# Patient Record
Sex: Male | Born: 1965 | Race: Black or African American | Hispanic: No | Marital: Married | Smoking: Never smoker
Health system: Southern US, Community
[De-identification: ages and names within clinical notes are randomized; demographics above are authoritative.]

## PROBLEM LIST (undated history)

## (undated) DIAGNOSIS — E785 Hyperlipidemia, unspecified: Secondary | ICD-10-CM

## (undated) DIAGNOSIS — N200 Calculus of kidney: Secondary | ICD-10-CM

## (undated) DIAGNOSIS — Z972 Presence of dental prosthetic device (complete) (partial): Secondary | ICD-10-CM

## (undated) DIAGNOSIS — N289 Disorder of kidney and ureter, unspecified: Secondary | ICD-10-CM

## (undated) DIAGNOSIS — Q613 Polycystic kidney, unspecified: Secondary | ICD-10-CM

## (undated) DIAGNOSIS — M1990 Unspecified osteoarthritis, unspecified site: Secondary | ICD-10-CM

## (undated) DIAGNOSIS — I10 Essential (primary) hypertension: Secondary | ICD-10-CM

## (undated) DIAGNOSIS — Q446 Cystic disease of liver: Secondary | ICD-10-CM

## (undated) DIAGNOSIS — N183 Chronic kidney disease, stage 3 unspecified: Secondary | ICD-10-CM

## (undated) DIAGNOSIS — Z973 Presence of spectacles and contact lenses: Secondary | ICD-10-CM

## (undated) HISTORY — PX: SHOULDER SURGERY: SHX246

## (undated) HISTORY — PX: ANKLE SURGERY: SHX546

## (undated) HISTORY — PX: KNEE SURGERY: SHX244

## (undated) HISTORY — PX: WRIST SURGERY: SHX841

---
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
## 2014-02-01 ENCOUNTER — Emergency Department: Payer: Self-pay | Admitting: Emergency Medicine

## 2014-02-26 ENCOUNTER — Ambulatory Visit: Payer: Self-pay | Admitting: Podiatry

## 2015-02-13 ENCOUNTER — Encounter: Payer: Self-pay | Admitting: Emergency Medicine

## 2015-02-13 ENCOUNTER — Emergency Department
Admission: EM | Admit: 2015-02-13 | Discharge: 2015-02-13 | Disposition: A | Discharge status: Home / Self Care | Payer: Managed Care, Other (non HMO) | Attending: Emergency Medicine | Admitting: Emergency Medicine

## 2015-02-13 DIAGNOSIS — M7542 Impingement syndrome of left shoulder: Secondary | ICD-10-CM | POA: Diagnosis not present

## 2015-02-13 DIAGNOSIS — Z880 Allergy status to penicillin: Secondary | ICD-10-CM | POA: Insufficient documentation

## 2015-02-13 DIAGNOSIS — I10 Essential (primary) hypertension: Secondary | ICD-10-CM | POA: Insufficient documentation

## 2015-02-13 DIAGNOSIS — G8929 Other chronic pain: Secondary | ICD-10-CM | POA: Diagnosis not present

## 2015-02-13 DIAGNOSIS — M25531 Pain in right wrist: Secondary | ICD-10-CM | POA: Diagnosis not present

## 2015-02-13 DIAGNOSIS — M7712 Lateral epicondylitis, left elbow: Secondary | ICD-10-CM | POA: Insufficient documentation

## 2015-02-13 DIAGNOSIS — M79602 Pain in left arm: Secondary | ICD-10-CM | POA: Diagnosis present

## 2015-02-13 HISTORY — DX: Essential (primary) hypertension: I10

## 2015-02-13 HISTORY — DX: Disorder of kidney and ureter, unspecified: N28.9

## 2015-02-13 MED ORDER — ETODOLAC 400 MG PO TABS: 400.0000 mg | ORAL_TABLET | Freq: Two times a day (BID) | ORAL | Status: DC

## 2015-02-13 MED ORDER — PREDNISONE 10 MG PO TABS: 10.0000 mg | ORAL_TABLET | Freq: Every day | ORAL | Status: DC

## 2015-02-13 MED ORDER — PREDNISONE 20 MG PO TABS
60.0000 mg | ORAL_TABLET | Freq: Once | ORAL | Status: AC
  Administered 2015-02-13: 60 mg via ORAL
  Filled 2015-02-13: qty 3

## 2015-02-13 NOTE — Discharge Instructions (Signed)
Please purchase left tennis elbow strap. Discontinue meloxicam. Start 6 day prednisone taper. After completion of prednisone start etodolac. Follow-up with Dr. Martha Clan in 5-7 days.   Rotator Cuff Injury Rotator cuff injury is any type of injury to the set of muscles and tendons that make up the stabilizing unit of your shoulder. This unit holds the ball of your upper arm bone (humerus) in the socket of your shoulder blade (scapula).  CAUSES Injuries to your rotator cuff most commonly come from sports or activities that cause your arm to be moved repeatedly over your head. Examples of this include throwing, weight lifting, swimming, or racquet sports. Long lasting (chronic) irritation of your rotator cuff can cause soreness and swelling (inflammation), bursitis, and eventual damage to your tendons, such as a tear (rupture). SIGNS AND SYMPTOMS Acute rotator cuff tear:  Sudden tearing sensation followed by severe pain shooting from your upper shoulder down your arm toward your elbow.  Decreased range of motion of your shoulder because of pain and muscle spasm.  Severe pain.  Inability to raise your arm out to the side because of pain and loss of muscle power (large tears). Chronic rotator cuff tear:  Pain that usually is worse at night and may interfere with sleep.  Gradual weakness and decreased shoulder motion as the pain worsens.  Decreased range of motion. Rotator cuff tendinitis:  Deep ache in your shoulder and the outside upper arm over your shoulder.  Pain that comes on gradually and becomes worse when lifting your arm to the side or turning it inward. DIAGNOSIS Rotator cuff injury is diagnosed through a medical history, physical exam, and imaging exam. The medical history helps determine the type of rotator cuff injury. Your health care provider will look at your injured shoulder, feel the injured area, and ask you to move your shoulder in different positions. X-ray exams  typically are done to rule out other causes of shoulder pain, such as fractures. MRI is the exam of choice for the most severe shoulder injuries because the images show muscles and tendons.  TREATMENT  Chronic tear:  Medicine for pain, such as acetaminophen or ibuprofen.  Physical therapy and range-of-motion exercises may be helpful in maintaining shoulder function and strength.  Steroid injections into your shoulder joint.  Surgical repair of the rotator cuff if the injury does not heal with noninvasive treatment. Acute tear:  Anti-inflammatory medicines such as ibuprofen and naproxen to help reduce pain and swelling.  A sling to help support your arm and rest your rotator cuff muscles. Long-term use of a sling is not advised. It may cause significant stiffening of the shoulder joint.  Surgery may be considered within a few weeks, especially in younger, active people, to return the shoulder to full function.  Indications for surgical treatment include the following:  Age younger than 60 years.  Rotator cuff tears that are complete.  Physical therapy, rest, and anti-inflammatory medicines have been used for 6-8 weeks, with no improvement.  Employment or sporting activity that requires constant shoulder use. Tendinitis:  Anti-inflammatory medicines such as ibuprofen and naproxen to help reduce pain and swelling.  A sling to help support your arm and rest your rotator cuff muscles. Long-term use of a sling is not advised. It may cause significant stiffening of the shoulder joint.  Severe tendinitis may require:  Steroid injections into your shoulder joint.  Physical therapy.  Surgery. HOME CARE INSTRUCTIONS   Apply ice to your injury:  Put ice in a  plastic bag.  Place a towel between your skin and the bag.  Leave the ice on for 20 minutes, 2-3 times a day.  If you have a shoulder immobilizer (sling and straps), wear it until told otherwise by your health care  provider.  You may want to sleep on several pillows or in a recliner at night to lessen swelling and pain.  Only take over-the-counter or prescription medicines for pain, discomfort, or fever as directed by your health care provider.  Do simple hand squeezing exercises with a soft rubber ball to decrease hand swelling. SEEK MEDICAL CARE IF:   Your shoulder pain increases, or new pain or numbness develops in your arm, hand, or fingers.  Your hand or fingers are colder than your other hand. SEEK IMMEDIATE MEDICAL CARE IF:   Your arm, hand, or fingers are numb or tingling.  Your arm, hand, or fingers are increasingly swollen and painful, or they turn white or blue. MAKE SURE YOU:  Understand these instructions.  Will watch your condition.  Will get help right away if you are not doing well or get worse.   This information is not intended to replace advice given to you by your health care provider. Make sure you discuss any questions you have with your health care provider.   Document Released: 03/10/2000 Document Revised: 03/18/2013 Document Reviewed: 10/23/2012 [employer] Interactive Patient Education 2016 [employer]  Lateral Epicondylitis With Rehab Lateral epicondylitis involves inflammation and pain around the outer portion of the elbow. The pain is caused by inflammation of the tendons in the forearm that bring back (extend) the wrist. Lateral epicondylitis is also called tennis elbow, because it is very common in tennis players. However, it may occur in any individual who extends the wrist repetitively. If lateral epicondylitis is left untreated, it may become a chronic problem. SYMPTOMS   Pain, tenderness, and inflammation on the outer (lateral) side of the elbow.  Pain or weakness with gripping activities.  Pain that increases with wrist-twisting motions (playing tennis, using a screwdriver, opening a door or a jar).  Pain with lifting objects, including a coffee  cup. CAUSES  Lateral epicondylitis is caused by inflammation of the tendons that extend the wrist. Causes of injury may include:  Repetitive stress and strain on the muscles and tendons that extend the wrist.  Sudden change in activity level or intensity.  Incorrect grip in racquet sports.  Incorrect grip size of racquet (often too large).  Incorrect hitting position or technique (usually backhand, leading with the elbow).  Using a racket that is too heavy. RISK INCREASES WITH:  Sports or occupations that require repetitive and/or strenuous forearm and wrist movements (tennis, squash, racquetball, carpentry).  Poor wrist and forearm strength and flexibility.  Failure to warm up properly before activity.  Resuming activity before healing, rehabilitation, and conditioning are complete. PREVENTION   Warm up and stretch properly before activity.  Maintain physical fitness:  Strength, flexibility, and endurance.  Cardiovascular fitness.  Wear and use properly fitted equipment.  Learn and use proper technique and have a coach correct improper technique.  Wear a tennis elbow (counterforce) brace. PROGNOSIS  The course of this condition depends on the degree of the injury. If treated properly, acute cases (symptoms lasting less than 4 weeks) are often resolved in 2 to 6 weeks. Chronic (longer lasting cases) often resolve in 3 to 6 months but may require physical therapy. RELATED COMPLICATIONS   Frequently recurring symptoms, resulting in a chronic problem. Properly  treating the problem the first time decreases frequency of recurrence.  Chronic inflammation, scarring tendon degeneration, and partial tendon tear, requiring surgery.  Delayed healing or resolution of symptoms. TREATMENT  Treatment first involves the use of ice and medicine to reduce pain and inflammation. Strengthening and stretching exercises may help reduce discomfort if performed regularly. These exercises may  be performed at home if the condition is an acute injury. Chronic cases may require a referral to a physical therapist for evaluation and treatment. Your caregiver may advise a corticosteroid injection to help reduce inflammation. Rarely, surgery is needed. MEDICATION  If pain medicine is needed, nonsteroidal anti-inflammatory medicines (aspirin and ibuprofen), or other minor pain relievers (acetaminophen), are often advised.  Do not take pain medicine for 7 days before surgery.  Prescription pain relievers may be given, if your caregiver thinks they are needed. Use only as directed and only as much as you need.  Corticosteroid injections may be recommended. These injections should be reserved only for the most severe cases, because they can only be given a certain number of times. HEAT AND COLD  Cold treatment (icing) should be applied for 10 to 15 minutes every 2 to 3 hours for inflammation and pain, and immediately after activity that aggravates your symptoms. Use ice packs or an ice massage.  Heat treatment may be used before performing stretching and strengthening activities prescribed by your caregiver, physical therapist, or athletic trainer. Use a heat pack or a warm water soak. SEEK MEDICAL CARE IF: Symptoms get worse or do not improve in 2 weeks, despite treatment. EXERCISES  RANGE OF MOTION (ROM) AND STRETCHING EXERCISES - Epicondylitis, Lateral (Tennis Elbow) These exercises may help you when beginning to rehabilitate your injury. Your symptoms may go away with or without further involvement from your physician, physical therapist, or athletic trainer. While completing these exercises, remember:   Restoring tissue flexibility helps normal motion to return to the joints. This allows healthier, less painful movement and activity.  An effective stretch should be held for at least 30 seconds.  A stretch should never be painful. You should only feel a gentle lengthening or release in  the stretched tissue. RANGE OF MOTION - Wrist Flexion, Active-Assisted  Extend your right / left elbow with your fingers pointing down.*  Gently pull the back of your hand towards you, until you feel a gentle stretch on the top of your forearm.  Hold this position for __________ seconds. Repeat __________ times. Complete this exercise __________ times per day.  *If directed by your physician, physical therapist or athletic trainer, complete this stretch with your elbow bent, rather than extended. RANGE OF MOTION - Wrist Extension, Active-Assisted  Extend your right / left elbow and turn your palm upwards.*  Gently pull your palm and fingertips back, so your wrist extends and your fingers point more toward the ground.  You should feel a gentle stretch on the inside of your forearm.  Hold this position for __________ seconds. Repeat __________ times. Complete this exercise __________ times per day. *If directed by your physician, physical therapist or athletic trainer, complete this stretch with your elbow bent, rather than extended. STRETCH - Wrist Flexion  Place the back of your right / left hand on a tabletop, leaving your elbow slightly bent. Your fingers should point away from your body.  Gently press the back of your hand down onto the table by straightening your elbow. You should feel a stretch on the top of your forearm.  Hold  this position for __________ seconds. Repeat __________ times. Complete this stretch __________ times per day.  STRETCH - Wrist Extension   Place your right / left fingertips on a tabletop, leaving your elbow slightly bent. Your fingers should point backwards.  Gently press your fingers and palm down onto the table by straightening your elbow. You should feel a stretch on the inside of your forearm.  Hold this position for __________ seconds. Repeat __________ times. Complete this stretch __________ times per day.  STRENGTHENING EXERCISES -  Epicondylitis, Lateral (Tennis Elbow) These exercises may help you when beginning to rehabilitate your injury. They may resolve your symptoms with or without further involvement from your physician, physical therapist, or athletic trainer. While completing these exercises, remember:   Muscles can gain both the endurance and the strength needed for everyday activities through controlled exercises.  Complete these exercises as instructed by your physician, physical therapist or athletic trainer. Increase the resistance and repetitions only as guided.  You may experience muscle soreness or fatigue, but the pain or discomfort you are trying to eliminate should never worsen during these exercises. If this pain does get worse, stop and make sure you are following the directions exactly. If the pain is still present after adjustments, discontinue the exercise until you can discuss the trouble with your caregiver. STRENGTH - Wrist Flexors  Sit with your right / left forearm palm-up and fully supported on a table or countertop. Your elbow should be resting below the height of your shoulder. Allow your wrist to extend over the edge of the surface.  Loosely holding a __________ weight, or a piece of rubber exercise band or tubing, slowly curl your hand up toward your forearm.  Hold this position for __________ seconds. Slowly lower the wrist back to the starting position in a controlled manner. Repeat __________ times. Complete this exercise __________ times per day.  STRENGTH - Wrist Extensors  Sit with your right / left forearm palm-down and fully supported on a table or countertop. Your elbow should be resting below the height of your shoulder. Allow your wrist to extend over the edge of the surface.  Loosely holding a __________ weight, or a piece of rubber exercise band or tubing, slowly curl your hand up toward your forearm.  Hold this position for __________ seconds. Slowly lower the wrist back to  the starting position in a controlled manner. Repeat __________ times. Complete this exercise __________ times per day.  STRENGTH - Ulnar Deviators  Stand with a ____________________ weight in your right / left hand, or sit while holding a rubber exercise band or tubing, with your healthy arm supported on a table or countertop.  Move your wrist, so that your pinkie travels toward your forearm and your thumb moves away from your forearm.  Hold this position for __________ seconds and then slowly lower the wrist back to the starting position. Repeat __________ times. Complete this exercise __________ times per day STRENGTH - Radial Deviators  Stand with a ____________________ weight in your right / left hand, or sit while holding a rubber exercise band or tubing, with your injured arm supported on a table or countertop.  Raise your hand upward in front of you or pull up on the rubber tubing.  Hold this position for __________ seconds and then slowly lower the wrist back to the starting position. Repeat __________ times. Complete this exercise __________ times per day. STRENGTH - Forearm Supinators   Sit with your right / left forearm supported  on a table, keeping your elbow below shoulder height. Rest your hand over the edge, palm down.  Gently grip a hammer or a soup ladle.  Without moving your elbow, slowly turn your palm and hand upward to a "thumbs-up" position.  Hold this position for __________ seconds. Slowly return to the starting position. Repeat __________ times. Complete this exercise __________ times per day.  STRENGTH - Forearm Pronators   Sit with your right / left forearm supported on a table, keeping your elbow below shoulder height. Rest your hand over the edge, palm up.  Gently grip a hammer or a soup ladle.  Without moving your elbow, slowly turn your palm and hand upward to a "thumbs-up" position.  Hold this position for __________ seconds. Slowly return to the  starting position. Repeat __________ times. Complete this exercise __________ times per day.  STRENGTH - Grip  Grasp a tennis ball, a dense sponge, or a large, rolled sock in your hand.  Squeeze as hard as you can, without increasing any pain.  Hold this position for __________ seconds. Release your grip slowly. Repeat __________ times. Complete this exercise __________ times per day.  STRENGTH - Elbow Extensors, Isometric  Stand or sit upright, on a firm surface. Place your right / left arm so that your palm faces your stomach, and it is at the height of your waist.  Place your opposite hand on the underside of your forearm. Gently push up as your right / left arm resists. Push as hard as you can with both arms, without causing any pain or movement at your right / left elbow. Hold this stationary position for __________ seconds. Gradually release the tension in both arms. Allow your muscles to relax completely before repeating.   This information is not intended to replace advice given to you by your health care provider. Make sure you discuss any questions you have with your health care provider.   Document Released: 03/13/2005 Document Revised: 04/03/2014 Document Reviewed: 06/25/2008 Else[employer] Interactive Patient Education 2016 [employer]  Tendinitis Tendinitis is swelling and inflammation of the tendons. Tendons are band-like tissues that connect muscle to bone. Tendinitis commonly occurs in the:   Shoulders (rotator cuff).  Heels (Achilles tendon).  Elbows (triceps tendon). CAUSES Tendinitis is usually caused by overusing the tendon, muscles, and joints involved. When the tissue surrounding a tendon (synovium) becomes inflamed, it is called tenosynovitis. Tendinitis commonly develops in people whose jobs require repetitive motions. SYMPTOMS  Pain.  Tenderness.  Mild swelling. DIAGNOSIS Tendinitis is usually diagnosed by physical exam. Your health care provider may  also order X-rays or other imaging tests. TREATMENT Your health care provider may recommend certain medicines or exercises for your treatment. HOME CARE INSTRUCTIONS   Use a sling or splint for as long as directed by your health care provider until the pain decreases.  Put ice on the injured area.  Put ice in a plastic bag.  Place a towel between your skin and the bag.  Leave the ice on for 15-20 minutes, 3-4 times a day, or as directed by your health care provider.  Avoid using the limb while the tendon is painful. Perform gentle range of motion exercises only as directed by your health care provider. Stop exercises if pain or discomfort increase, unless directed otherwise by your health care provider.  Only take over-the-counter or prescription medicines for pain, discomfort, or fever as directed by your health care provider. SEEK MEDICAL CARE IF:   Your pain and swelling  increase.  You develop new, unexplained symptoms, especially increased numbness in the hands. MAKE SURE YOU:   Understand these instructions.  Will watch your condition.  Will get help right away if you are not doing well or get worse.   This information is not intended to replace advice given to you by your health care provider. Make sure you discuss any questions you have with your health care provider.   Document Released: 03/10/2000 Document Revised: 04/03/2014 Document Reviewed: 05/30/2010 [employer] Interactive Patient Education [employer]

## 2015-02-13 NOTE — ED Provider Notes (Signed)
CSN: [id number]     Arrival date & time 02/13/15  2016 History   First MD Initiated Contact with Patient 02/13/15 2106     Chief Complaint  Patient presents with  . Arm Pain    hx of arthritis     (Consider location/radiation/quality/duration/timing/severity/associated sxs/prior Treatment) HPI  49 year old male presents to emergency department for evaluation of multiple joint complaints. Pain mostly complains of left shoulder pain that is described as sharp and aching and increased with abduction and flexion greater than 90. He has a history of rotator cuff repair 10 years ago, no recent trauma or injury. Patient was recently prescribed meloxicam by PCP, this medication causes drowsiness. Patient also complains of left lateral elbow pain. He points to the lateral epi condyle. Patient has increased pain with grasping and gripping as well as with lifting with the palm down. Pain is sharp and moderate. No relief with the meloxicam. Complains of right wrist pain. He had a wrist fracture that required ORIF years ago. He has intermittent flareups of right wrist pain along the ulnar styloid. He denies any new trauma or injury. No numbness or tingling throughout the upper extremities. Patient denies any chest pain or shortness of breath. Patient works at [employer], performs repetitive overhead movements and lifts heavy objects daily.  Past Medical History  Diagnosis Date  . Hypertension   . Renal disorder    Past Surgical History  Procedure Laterality Date  . Shoulder surgery Left   . Wrist surgery Left    No family history on file. Social History  Substance Use Topics  . Smoking status: Never Smoker   . Smokeless tobacco: None  . Alcohol Use: No    Review of Systems  Constitutional: Negative.  Negative for fever, chills, activity change and appetite change.  HENT: Negative for congestion, ear pain, mouth sores, rhinorrhea, sinus pressure, sore throat and trouble swallowing.   Eyes:  Negative for photophobia, pain and discharge.  Respiratory: Negative for cough, chest tightness and shortness of breath.   Cardiovascular: Negative for chest pain and leg swelling.  Gastrointestinal: Negative for nausea, vomiting, abdominal pain, diarrhea and abdominal distention.  Genitourinary: Negative for dysuria and difficulty urinating.  Musculoskeletal: Positive for arthralgias. Negative for back pain, gait problem, neck pain (no radicular symptoms) and neck stiffness.  Skin: Negative for color change and rash.  Neurological: Negative for dizziness and headaches.  Hematological: Negative for adenopathy.  Psychiatric/Behavioral: Negative for behavioral problems and agitation.      Allergies  Penicillins  Home Medications   Prior to Admission medications   Medication Sig Start Date End Date Taking? Authorizing Provider  etodolac (LODINE) 400 MG tablet Take 1 tablet (400 mg total) by mouth 2 (two) times daily. 02/13/15   Evon Slack, PA-C  predniSONE (DELTASONE) 10 MG tablet Take 1 tablet (10 mg total) by mouth daily. 6,5,4,3,2,1 six day taper 02/13/15   Evon Slack, PA-C   There were no vitals taken for this visit. Physical Exam  Constitutional: He is oriented to person, place, and time. He appears well-developed and well-nourished.  HENT:  Head: Normocephalic and atraumatic.  Eyes: Conjunctivae and EOM are normal. Pupils are equal, round, and reactive to light.  Neck: Normal range of motion. Neck supple.  Cardiovascular: Normal rate, regular rhythm, normal heart sounds and intact distal pulses.   Pulmonary/Chest: Effort normal and breath sounds normal. No respiratory distress. He has no wheezes. He has no rales. He exhibits no tenderness.  Abdominal: Soft.  Bowel sounds are normal. He exhibits no distension. There is no tenderness.  Musculoskeletal:  Examination of left shoulder shows patient has full active and passive range of motion. Patient has increased pain with  abduction and flexion greater than 90. Patient has a positive Hawkins, impingement, into can test. Negative drop arm test.  Examination of the left elbow shows patient has tenderness of the lateral condyle with painful resisted wrist extension. No swelling warmth erythema or effusion. No pain with resisted wrist flexion. Full range of motion of the left elbow.  Examination of the right wrist shows the patient has no swelling warmth erythema. He is tender along the ulnar styloid. There is increased laxity with DRUJ stress testing. No painful popping or clicking with pronation or supination. Patient is full composite fist with grip strength 5 out of 5.  Neurological: He is alert and oriented to person, place, and time.  Skin: Skin is warm and dry.  Psychiatric: He has a normal mood and affect. His behavior is normal. Judgment and thought content normal.    ED Course  Procedures (including critical care time) Labs Review Labs Reviewed - No data to display  Imaging Review No results found. I have personally reviewed and evaluated these images and lab results as part of my medical decision-making.   EKG Interpretation None      MDM   Final diagnoses:  Left lateral epicondylitis  Rotator cuff impingement syndrome, left  Chronic pain of right wrist    49 year old male with multiple joint complaints and a history of posttraumatic osteoarthritis and tendinitis. He was recently prescribed meloxicam by PCP with no relief. Patient will be started with a 6 day prednisone taper. After completion prednisone he will start etodolac 500 mg 1 tab by mouth twice a day. He will follow-up with Dr. Martha ClanKrasinski 5-7 days for repeat evaluation. Patient was also shown tennis elbow straps, he will place this on the left forearm, avoid lifting with the palm down. Return to the ER for any worsening symptoms or urgent changes in health.    Evon Slackhomas C Gaines, PA-C 02/13/15 2121  Jennye MoccasinBrian S Quigley, MD 02/13/15  [phone]

## 2015-02-13 NOTE — ED Notes (Signed)
NAD noted at this time. Pt ambulatory to the lobby at this time. Pt denies comments/concerns at this time.

## 2015-02-13 NOTE — ED Provider Notes (Signed)
ED ECG REPORT I, Jennye MoccasinBrian S Quigley, the attending physician, personally viewed and interpreted this ECG.  Date: 02/13/2015 EKG Time: 2029 Rate: 55 Rhythm: normal sinus rhythm QRS Axis: normal Intervals: normal ST/T Wave abnormalities: normal Conduction Disutrbances: none Narrative Interpretation: unremarkable Early repolarization. No acute ischemic changes are noted  Jennye MoccasinBrian S Quigley, MD 02/13/15 2112

## 2015-02-13 NOTE — ED Notes (Signed)
Left arm pain for over a month, also c/o right wrist.  No recent injuries.  Has seen PCP in [place] about the pain and has utilized a brace but the pain has gotten worse.

## 2015-06-05 ENCOUNTER — Emergency Department
Admission: EM | Admit: 2015-06-05 | Discharge: 2015-06-05 | Disposition: A | Discharge status: Home / Self Care | Payer: Managed Care, Other (non HMO) | Attending: Emergency Medicine | Admitting: Emergency Medicine

## 2015-06-05 ENCOUNTER — Encounter: Payer: Self-pay | Admitting: Emergency Medicine

## 2015-06-05 ENCOUNTER — Emergency Department: Payer: Managed Care, Other (non HMO)

## 2015-06-05 DIAGNOSIS — I10 Essential (primary) hypertension: Secondary | ICD-10-CM | POA: Insufficient documentation

## 2015-06-05 DIAGNOSIS — Z880 Allergy status to penicillin: Secondary | ICD-10-CM | POA: Insufficient documentation

## 2015-06-05 DIAGNOSIS — Z79899 Other long term (current) drug therapy: Secondary | ICD-10-CM | POA: Diagnosis not present

## 2015-06-05 DIAGNOSIS — M79672 Pain in left foot: Secondary | ICD-10-CM | POA: Insufficient documentation

## 2015-06-05 DIAGNOSIS — G8929 Other chronic pain: Secondary | ICD-10-CM | POA: Diagnosis not present

## 2015-06-05 DIAGNOSIS — M79671 Pain in right foot: Secondary | ICD-10-CM | POA: Insufficient documentation

## 2015-06-05 MED ORDER — PREDNISONE 10 MG PO TABS: ORAL_TABLET | ORAL | Status: DC

## 2015-06-05 NOTE — ED Provider Notes (Signed)
[facility] Emergency Department Provider Note  ____________________________________________  Time seen: Approximately 4:54 PM  I have reviewed the triage vital signs and the nursing notes.   HISTORY  Chief Complaint Foot Injury   HPI John Gregory is a 50 y.o. male is here with complaint of bilateral foot pain for 3 weeks. Patient states that he was told at work that this most likely is gout and he is here for treatment. He states that he has seen a podiatrist in the past for his feet but because of his work and has not been able to make an appointment. He has not taken any over-the-counter medication for his foot pain. He denies any injury to his feet but states that he works at [employer] and is constantly walking.He rates his pain as 6/10.   Past Medical History  Diagnosis Date  . Hypertension   . Renal disorder     There are no active problems to display for this patient.   Past Surgical History  Procedure Laterality Date  . Shoulder surgery Left   . Wrist surgery Left     Current Outpatient Rx  Name  Route  Sig  Dispense  Refill  . etodolac (LODINE) 400 MG tablet   Oral   Take 1 tablet (400 mg total) by mouth 2 (two) times daily.   30 tablet   0   . predniSONE (DELTASONE) 10 MG tablet      Take 3 tablets for 3 days   9 tablet   0     Allergies Penicillins  No family history on file.  Social History Social History  Substance Use Topics  . Smoking status: Never Smoker   . Smokeless tobacco: None  . Alcohol Use: No    Review of Systems Constitutional: No fever/chills Cardiovascular: Denies chest pain. Respiratory: Denies shortness of breath. Gastrointestinal:  No nausea, no vomiting.   Musculoskeletal: Bilateral foot pain, chronic Skin: Negative for rash. Neurological: Negative for headaches, focal weakness or numbness.  10-point ROS otherwise negative.  ____________________________________________   PHYSICAL  EXAM:  VITAL SIGNS: ED Triage Vitals  Enc Vitals Group     BP 06/05/15 1627 156/109 mmHg     Pulse Rate 06/05/15 1627 64     Resp 06/05/15 1627 18     Temp 06/05/15 1627 97.7 F (36.5 C)     Temp Source 06/05/15 1627 Oral     SpO2 06/05/15 1627 100 %     Weight 06/05/15 1627 145 lb (65.772 kg)     Height 06/05/15 1627  (1.778 m)     Head Cir --      Peak Flow --      Pain Score 06/05/15 1628 6     Pain Loc --      Pain Edu? --      Excl. in GC? --     Constitutional: Alert and oriented. Well appearing and in no acute distress. Eyes: Conjunctivae are normal. PERRL. EOMI. Head: Atraumatic. Nose: No congestion/rhinnorhea. Neck: No stridor.   Cardiovascular: Normal rate, regular rhythm. Grossly normal heart sounds.  Good peripheral circulation. Respiratory: Normal respiratory effort.  No retractions. Lungs CTAB. Gastrointestinal: Soft and nontender. No distention.  Musculoskeletal: Examination of feet films show any gross abnormality. There is some minimal tenderness on palpation of the plantar aspect of both feet. There is no soft tissue edema and no erythema present. Pulses present bilaterally Neurologic:  Normal speech and language. No gross focal neurologic deficits  are appreciated. No gait instability. Skin:  Skin is warm, dry and intact. No rash noted. No erythema, ecchymosis or abrasions were noted. Psychiatric: Mood and affect are normal. Speech and behavior are normal.  ____________________________________________   LABS (all labs ordered are listed, but only abnormal results are displayed)  Labs Reviewed - No data to display  RADIOLOGY  Bilateral foot x-rays show hallux valgus deformity and degenerative changes but otherwise normal ____________________________________________   PROCEDURES  Procedure(s) performed: None  Critical Care performed: No  ____________________________________________   INITIAL IMPRESSION / ASSESSMENT AND PLAN / ED  COURSE  Pertinent labs & imaging results that were available during my care of the patient were reviewed by me and considered in my medical decision making (see chart for details).  Patient was placed on prednisone 30 mg for 3 days. He is to follow-up with Dr. Alberteen Spindleline he is seen in the past for his feet. ____________________________________________   FINAL CLINICAL IMPRESSION(S) / ED DIAGNOSES  Final diagnoses:  Bilateral foot pain      Tommi RumpsRhonda L Herrle, PA-C 06/05/15 2138  Sharyn CreamerMark Quale, MD 06/06/15 0000

## 2015-06-05 NOTE — Discharge Instructions (Signed)
You need to follow-up with your primary care doctor or Dr. Alberteen Spindleline for further evaluation of your foot pain and feet swelling. Prednisone 30 mg for 3 days for foot pain due to the fact they have polycystic kidney disease anti-inflammatories are not suggested.

## 2015-06-05 NOTE — ED Notes (Signed)
Bilateral foot pain x 3 weeks, denies injury.

## 2015-07-21 ENCOUNTER — Emergency Department
Admission: EM | Admit: 2015-07-21 | Discharge: 2015-07-21 | Disposition: A | Discharge status: Home / Self Care | Payer: Managed Care, Other (non HMO) | Attending: Emergency Medicine | Admitting: Emergency Medicine

## 2015-07-21 ENCOUNTER — Emergency Department: Payer: Managed Care, Other (non HMO)

## 2015-07-21 DIAGNOSIS — Z87442 Personal history of urinary calculi: Secondary | ICD-10-CM | POA: Diagnosis not present

## 2015-07-21 DIAGNOSIS — Q613 Polycystic kidney, unspecified: Secondary | ICD-10-CM | POA: Diagnosis not present

## 2015-07-21 DIAGNOSIS — I10 Essential (primary) hypertension: Secondary | ICD-10-CM | POA: Insufficient documentation

## 2015-07-21 DIAGNOSIS — Z79899 Other long term (current) drug therapy: Secondary | ICD-10-CM | POA: Insufficient documentation

## 2015-07-21 DIAGNOSIS — R109 Unspecified abdominal pain: Secondary | ICD-10-CM | POA: Diagnosis present

## 2015-07-21 DIAGNOSIS — N289 Disorder of kidney and ureter, unspecified: Secondary | ICD-10-CM | POA: Insufficient documentation

## 2015-07-21 HISTORY — DX: Calculus of kidney: N20.0

## 2015-07-21 LAB — BASIC METABOLIC PANEL
Anion gap: 8 (ref 5–15)
BUN: 22 mg/dL — AB (ref 6–20)
CHLORIDE: 103 mmol/L (ref 101–111)
CO2: 27 mmol/L (ref 22–32)
Calcium: 8.7 mg/dL — ABNORMAL LOW (ref 8.9–10.3)
Creatinine, Ser: 2.58 mg/dL — ABNORMAL HIGH (ref 0.61–1.24)
GFR calc Af Amer: 32 mL/min — ABNORMAL LOW (ref >60)
GFR calc non Af Amer: 28 mL/min — ABNORMAL LOW (ref >60)
GLUCOSE: 114 mg/dL — AB (ref 65–99)
POTASSIUM: 4.1 mmol/L (ref 3.5–5.1)
Sodium: 138 mmol/L (ref 135–145)

## 2015-07-21 LAB — URINALYSIS COMPLETE WITH MICROSCOPIC (ARMC ONLY)
Bacteria, UA: NONE SEEN
Bilirubin Urine: NEGATIVE
Glucose, UA: NEGATIVE mg/dL
HGB URINE DIPSTICK: NEGATIVE
KETONES UR: NEGATIVE mg/dL
LEUKOCYTES UA: NEGATIVE
Nitrite: NEGATIVE
PH: 5 (ref 5.0–8.0)
PROTEIN: NEGATIVE mg/dL
SPECIFIC GRAVITY, URINE: 1.009 (ref 1.005–1.030)
Squamous Epithelial / LPF: NONE SEEN

## 2015-07-21 LAB — CBC
HEMATOCRIT: 37 % — AB (ref 40.0–52.0)
HEMOGLOBIN: 12.5 g/dL — AB (ref 13.0–18.0)
MCH: 27.3 pg (ref 26.0–34.0)
MCHC: 33.6 g/dL (ref 32.0–36.0)
MCV: 81.2 fL (ref 80.0–100.0)
Platelets: 195 10*3/uL (ref 150–440)
RBC: 4.56 MIL/uL (ref 4.40–5.90)
RDW: 13.2 % (ref 11.5–14.5)
WBC: 4.7 10*3/uL (ref 3.8–10.6)

## 2015-07-21 MED ORDER — TRAMADOL HCL 50 MG PO TABS: 50.0000 mg | ORAL_TABLET | Freq: Four times a day (QID) | ORAL | Status: AC | PRN

## 2015-07-21 NOTE — Discharge Instructions (Signed)
Flank Pain Flank pain refers to pain that is located on the side of the body between the upper abdomen and the back. The pain may occur over a short period of time (acute) or may be long-term or reoccurring (chronic). It may be mild or severe. Flank pain can be caused by many things. CAUSES  Some of the more common causes of flank pain include:  Muscle strains.   Muscle spasms.   A disease of your spine (vertebral disk disease).   A lung infection (pneumonia).   Fluid around your lungs (pulmonary edema).   A kidney infection.   Kidney stones.   A very painful skin rash caused by the chickenpox virus (shingles).   Gallbladder disease.  HOME CARE INSTRUCTIONS  Home care will depend on the cause of your pain. In general,  Rest as directed by your caregiver.  Drink enough fluids to keep your urine clear or pale yellow.  Only take over-the-counter or prescription medicines as directed by your caregiver. Some medicines may help relieve the pain.  Tell your caregiver about any changes in your pain.  Follow up with your caregiver as directed. SEEK IMMEDIATE MEDICAL CARE IF:   Your pain is not controlled with medicine.   You have new or worsening symptoms.  Your pain increases.   You have abdominal pain.   You have shortness of breath.   You have persistent nausea or vomiting.   You have swelling in your abdomen.   You feel faint or pass out.   You have blood in your urine.  You have a fever or persistent symptoms for more than 2-3 days.  You have a fever and your symptoms suddenly get worse. MAKE SURE YOU:   Understand these instructions.  Will watch your condition.  Will get help right away if you are not doing well or get worse.   This information is not intended to replace advice given to you by your health care provider. Make sure you discuss any questions you have with your health care provider.   Document Released: 05/04/2005 Document  Revised: 12/06/2011 Document Reviewed: 10/26/2011 Elsevier Interactive Patient Education 2016 Elsevier Inc.  Polycystic Kidney Disease, Adult Polycystic kidney disease is a disease in which the kidneys grow many small fluid-filled cysts. These cysts squeeze healthy kidney tissue, hurting the function of the kidneys. Polycystic kidney disease is present at birth and often causes progressive loss of kidney function and high blood pressure (hypertension). In milder forms of the disease, kidney function may be adequate throughout life.  CAUSES  The condition is usually inherited from parents. SIGNS AND SYMPTOMS  Hypertension.   Not enough healthy red blood cells to carry adequate oxygen to your tissues (anemia).   Pain in middle and side of the back below ribs (flank pain). This can happen if a cyst is bleeding.   Blood in the urine.   Kidney failure.   Kidney stones.   Increased urination at night.   Liver disease and liver cysts.  DIAGNOSIS  Your health care provider will ask you about your history and symptoms and perform a physical exam. Tests may be done to diagnose the condition. They may include an ultrasound, a CT scan, or an MRI scan. Sometimes chromosome studies are done to see if you have the genes to have polycystic kidneys.  TREATMENT   There is no treatment at this time to keep cysts from forming or getting larger.  Cysts may need to be drained with a needle  if they are large, putting pressure on other organs, and further destroying kidney tissue. This may require surgery.  Hypertension may be treated with medicines. Treating hypertension slows down further damage to the kidneys.  If kidney failure occurs, dialysis or transplantation is used to treat the disease. HOME CARE INSTRUCTIONS You should follow up with a kidney specialist (nephrologist) so that your kidney function is monitored. SEEK MEDICAL CARE IF:  You have severe pain in the flank area.  You  have blood in your urine.   This information is not intended to replace advice given to you by your health care provider. Make sure you discuss any questions you have with your health care provider.   Document Released: 12/06/2000 Document Revised: 07/28/2014 Document Reviewed: 08/15/2012 Elsevier Interactive Patient Education 2016 Elsevier Inc. Chronic Kidney Disease Chronic kidney disease occurs when the kidneys are damaged over a long period. The kidneys are two organs that lie on either side of the spine between the middle of the back and the front of the abdomen. The kidneys:  Remove wastes and extra water from the blood.  Produce important hormones. These help keep bones strong, regulate blood pressure, and help create red blood cells.  Balance the fluids and chemicals in the blood and tissues. A small amount of kidney damage may not cause problems, but a large amount of damage may make it difficult or impossible for the kidneys to work the way they should. If steps are not taken to slow down the kidney damage or stop it from getting worse, the kidneys may stop working permanently. Most of the time, chronic kidney disease does not go away. However, it can often be controlled, and those with the disease can usually live normal lives. CAUSES The most common causes of chronic kidney disease are diabetes and high blood pressure (hypertension). Chronic kidney disease may also be caused by:  Diseases that cause the kidneys' filters to become inflamed.  Diseases that affect the immune system.  Genetic diseases.  Medicines that damage the kidneys, such as anti-inflammatory medicines.  Poisoning or exposure to toxic substances.  A reoccurring kidney or urinary infection.  A problem with urine flow. This may be caused by:  Cancer.  Kidney stones.  An enlarged prostate in males. SIGNS AND SYMPTOMS Because the kidney damage in chronic kidney disease occurs slowly, symptoms develop  slowly and may not be obvious until the kidney damage becomes severe. A person may have a kidney disease for years without showing any symptoms. Symptoms can include:  Swelling (edema) of the legs, ankles, or feet.  Tiredness (lethargy).  Nausea or vomiting.  Confusion.  Problems with urination, such as:  Decreased urine production.  Frequent urination, especially at night.  Frequent accidents in children who are potty trained.  Muscle twitches and cramps.  Shortness of breath.  Weakness.  Persistent itchiness.  Loss of appetite.  Metallic taste in the mouth.  Trouble sleeping.  Slowed development in children.  Short stature in children. DIAGNOSIS Chronic kidney disease may be detected and diagnosed by tests, including blood, urine, imaging, or kidney biopsy tests. TREATMENT Most chronic kidney diseases cannot be cured. Treatment usually involves relieving symptoms and preventing or slowing the progression of the disease. Treatment may include:  A special diet. You may need to avoid alcohol and foods thatare salty and high in potassium.  Medicines. These may:  Lower blood pressure.  Relieve anemia.  Relieve swelling.  Protect the bones. HOME CARE INSTRUCTIONS  Follow your prescribed  diet. Your health care provider may instruct you to limit daily salt (sodium) and protein intake.  Take medicines only as directed by your health care provider. Do not take any new medicines (prescription, over-the-counter, or nutritional supplements) unless approved by your health care provider. Many medicines can worsen your kidney damage or need to have the dose adjusted.   Quit smoking if you smoke. Talk to your health care provider about a smoking cessation program.  Keep all follow-up visits as directed by your health care provider.  Monitor your blood pressure.  Start or continue an exercise plan.  Get immunizations as directed by your health care  provider.  Take vitamin and mineral supplements as directed by your health care provider. SEEK IMMEDIATE MEDICAL CARE IF:  Your symptoms get worse or you develop new symptoms.  You develop symptoms of end-stage kidney disease. These include:  Headaches.  Abnormally dark or light skin.  Numbness in the hands or feet.  Easy bruising.  Frequent hiccups.  Menstruation stops.  You have a fever.  You have decreased urine production.  You havepain or bleeding when urinating. MAKE SURE YOU:  Understand these instructions.  Will watch your condition.  Will get help right away if you are not doing well or get worse. FOR MORE INFORMATION   American Association of Kidney Patients: ResidentialShow.iswww.aakp.org  National Kidney Foundation: www.kidney.org  American Kidney Fund: FightingMatch.com.eewww.akfinc.org  [employer] Program: www.lifeoptions.org and www.kidneyschool.org   This information is not intended to replace advice given to you by your health care provider. Make sure you discuss any questions you have with your health care provider.   Document Released: 12/21/2007 Document Revised: 04/03/2014 Document Reviewed: 11/10/2011 Elsevier Interactive Patient Education [employer] Elsevier Inc.

## 2015-07-21 NOTE — ED Notes (Signed)
Pt arrives to ER via POV, ambulatory c/o right flank pain that began yesterday, swelling in ankles. Hx of kidney stones and "problems with my kidneys".

## 2015-07-21 NOTE — ED Provider Notes (Signed)
[facility] Emergency Department Provider Note        Time seen: ----------------------------------------- 9:36 PM on 07/21/2015 -----------------------------------------    I have reviewed the triage vital signs and the nursing notes.   HISTORY  Chief Complaint Flank Pain    HPI John Gregory is a 50 y.o. male who presents to ER for right flank pain that began yesterday. He's also noted some swelling to his ankles. He states he has a history of kidney stones and problems with his kidneys. He was told recently he had depressed kidney functioning he's been referred to a nephrologist for follow-up. Patient states he still making urine as normal.   Past Medical History  Diagnosis Date  . Hypertension   . Renal disorder   . Kidney stone     There are no active problems to display for this patient.   Past Surgical History  Procedure Laterality Date  . Shoulder surgery Left   . Wrist surgery Left     Allergies Calcium-containing compounds and Penicillins  Social History Social History  Substance Use Topics  . Smoking status: Never Smoker   . Smokeless tobacco: None  . Alcohol Use: No    Review of Systems Constitutional: Negative for fever. Eyes: Negative for visual changes. ENT: Negative for sore throat. Cardiovascular: Negative for chest pain. Respiratory: Negative for shortness of breath. Gastrointestinal: Positive for flank pain Genitourinary: Negative for dysuria. Musculoskeletal: Negative for back pain. Positive for leg swelling Skin: Negative for rash. Neurological: Negative for headaches, focal weakness or numbness.  10-point ROS otherwise negative.  ____________________________________________   PHYSICAL EXAM:  VITAL SIGNS: ED Triage Vitals  Enc Vitals Group     BP 07/21/15 2050 134/97 mmHg     Pulse Rate 07/21/15 2050 67     Resp 07/21/15 2050 18     Temp 07/21/15 2050 98.1 F (36.7 C)     Temp Source  07/21/15 2050 Oral     SpO2 07/21/15 2050 98 %     Weight 07/21/15 2050 142 lb (64.411 kg)     Height 07/21/15 2050  (1.778 m)     Head Cir --      Peak Flow --      Pain Score 07/21/15 2051 5     Pain Loc --      Pain Edu? --      Excl. in GC? --     Constitutional: Alert and oriented. Well appearing and in no distress. Eyes: Conjunctivae are normal. PERRL. Normal extraocular movements. ENT   Head: Normocephalic and atraumatic.   Nose: No congestion/rhinnorhea.   Mouth/Throat: Mucous membranes are moist.   Neck: No stridor. Cardiovascular: Normal rate, regular rhythm. No murmurs, rubs, or gallops. Respiratory: Normal respiratory effort without tachypnea nor retractions. Breath sounds are clear and equal bilaterally. No wheezes/rales/rhonchi. Gastrointestinal: Soft and nontender. Normal bowel sounds Musculoskeletal: Nontender with normal range of motion in all extremities. No lower extremity tenderness nor edema. Neurologic:  Normal speech and language. No gross focal neurologic deficits are appreciated.  Skin:  Skin is warm, dry and intact. No rash noted. Psychiatric: Mood and affect are normal. Speech and behavior are normal.   ____________________________________________  ED COURSE:  Pertinent labs & imaging results that were available during my care of the patient were reviewed by me and considered in my medical decision making (see chart for details). Patient has a benign exam, we'll check basic labs and possible CT imaging. ____________________________________________    LABS (  pertinent positives/negatives)  Labs Reviewed  BASIC METABOLIC PANEL - Abnormal; Notable for the following:    Glucose, Bld 114 (*)    BUN 22 (*)    Creatinine, Ser 2.58 (*)    Calcium 8.7 (*)    GFR calc non Af Amer 28 (*)    GFR calc Af Amer 32 (*)    All other components within normal limits  CBC - Abnormal; Notable for the following:    Hemoglobin 12.5 (*)    HCT 37.0  (*)    All other components within normal limits  URINALYSIS COMPLETEWITH MICROSCOPIC ([facility] ONLY) - Abnormal; Notable for the following:    Color, Urine STRAW (*)    APPearance CLEAR (*)    All other components within normal limits    RADIOLOGY Images were viewed by me  CT renal protocol IMPRESSION: 1. 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 No evidence of intracystic acute hemorrhage or infection. No hydronephrosis. 2. Left nephrolithiasis 3. Bilateral inguinal hernias, containing nonobstructed small bowel on the left. ____________________________________________  FINAL ASSESSMENT AND PLAN  Flank pain, renal insufficiency, polycystic kidney disease  Plan: Patient with labs and imaging as dictated above. Patient presents for flank pain and was concerned that his polycystic kidneys have gotten worse. Patient is aware of his renal insufficiency and has nephrology follow-up scheduled. I will encourage mild pain medication and returning for worsening symptoms.   Emily FilbertWilliams, Shayde Gervacio E, MD   Note: This dictation was prepared with Dragon dictation. Any transcriptional errors that result from this process are unintentional   Emily FilbertJonathan E Dierks Wach, MD 07/21/15 2230

## 2015-11-03 ENCOUNTER — Encounter (INDEPENDENT_AMBULATORY_CARE_PROVIDER_SITE_OTHER): Payer: Managed Care, Other (non HMO) | Admitting: Podiatry

## 2015-11-03 NOTE — Progress Notes (Signed)
This encounter was created in error - please disregard.

## 2015-12-14 ENCOUNTER — Emergency Department: Payer: Managed Care, Other (non HMO)

## 2015-12-14 ENCOUNTER — Emergency Department
Admission: EM | Admit: 2015-12-14 | Discharge: 2015-12-15 | Disposition: A | Discharge status: Home / Self Care | Payer: Managed Care, Other (non HMO) | Attending: Emergency Medicine | Admitting: Emergency Medicine

## 2015-12-14 ENCOUNTER — Encounter: Payer: Self-pay | Admitting: Emergency Medicine

## 2015-12-14 DIAGNOSIS — R05 Cough: Secondary | ICD-10-CM | POA: Diagnosis not present

## 2015-12-14 DIAGNOSIS — R0602 Shortness of breath: Secondary | ICD-10-CM | POA: Diagnosis not present

## 2015-12-14 DIAGNOSIS — Z79899 Other long term (current) drug therapy: Secondary | ICD-10-CM | POA: Insufficient documentation

## 2015-12-14 DIAGNOSIS — I10 Essential (primary) hypertension: Secondary | ICD-10-CM | POA: Diagnosis not present

## 2015-12-14 DIAGNOSIS — R079 Chest pain, unspecified: Secondary | ICD-10-CM | POA: Diagnosis not present

## 2015-12-14 DIAGNOSIS — R0781 Pleurodynia: Secondary | ICD-10-CM | POA: Diagnosis present

## 2015-12-14 LAB — CBC WITH DIFFERENTIAL/PLATELET
BASOS PCT: 1 %
Basophils Absolute: 0 10*3/uL (ref 0–0.1)
Eosinophils Absolute: 0.2 10*3/uL (ref 0–0.7)
Eosinophils Relative: 4 %
HEMATOCRIT: 36.6 % — AB (ref 40.0–52.0)
HEMOGLOBIN: 12.6 g/dL — AB (ref 13.0–18.0)
Lymphocytes Relative: 43 %
Lymphs Abs: 2.1 10*3/uL (ref 1.0–3.6)
MCH: 28 pg (ref 26.0–34.0)
MCHC: 34.5 g/dL (ref 32.0–36.0)
MCV: 81.2 fL (ref 80.0–100.0)
Monocytes Absolute: 0.4 10*3/uL (ref 0.2–1.0)
Monocytes Relative: 8 %
NEUTROS ABS: 2.2 10*3/uL (ref 1.4–6.5)
NEUTROS PCT: 44 %
Platelets: 174 10*3/uL (ref 150–440)
RBC: 4.51 MIL/uL (ref 4.40–5.90)
RDW: 13.2 % (ref 11.5–14.5)
WBC: 4.9 10*3/uL (ref 3.8–10.6)

## 2015-12-14 LAB — COMPREHENSIVE METABOLIC PANEL
ALK PHOS: 50 U/L (ref 38–126)
ALT: 13 U/L — ABNORMAL LOW (ref 17–63)
ANION GAP: 6 (ref 5–15)
AST: 22 U/L (ref 15–41)
Albumin: 4.1 g/dL (ref 3.5–5.0)
BILIRUBIN TOTAL: 0.7 mg/dL (ref 0.3–1.2)
BUN: 19 mg/dL (ref 6–20)
CALCIUM: 9.1 mg/dL (ref 8.9–10.3)
CO2: 28 mmol/L (ref 22–32)
Chloride: 105 mmol/L (ref 101–111)
Creatinine, Ser: 1.88 mg/dL — ABNORMAL HIGH (ref 0.61–1.24)
GFR calc non Af Amer: 41 mL/min — ABNORMAL LOW (ref >60)
GFR, EST AFRICAN AMERICAN: 47 mL/min — AB (ref >60)
Glucose, Bld: 90 mg/dL (ref 65–99)
Potassium: 3.1 mmol/L — ABNORMAL LOW (ref 3.5–5.1)
Sodium: 139 mmol/L (ref 135–145)
TOTAL PROTEIN: 7 g/dL (ref 6.5–8.1)

## 2015-12-14 LAB — FIBRIN DERIVATIVES D-DIMER (ARMC ONLY): Fibrin derivatives D-dimer (ARMC): 306 (ref 0–499)

## 2015-12-14 LAB — TROPONIN I: Troponin I: <0.03 ng/mL (ref <0.03)

## 2015-12-14 MED ORDER — ASPIRIN 81 MG PO CHEW
324.0000 mg | CHEWABLE_TABLET | Freq: Once | ORAL | Status: AC
  Administered 2015-12-14: 324 mg via ORAL
  Filled 2015-12-14: qty 4

## 2015-12-14 NOTE — ED Triage Notes (Addendum)
Pt to triage via w/c with no distress noted; pt reports left sided CP radiating into shoulder accomp by nonprod cough; st pain began hr PTA while working on a car; also reports swelling to ankles "for awhile"; st hx polycystic kidney disease

## 2015-12-14 NOTE — ED Notes (Signed)
Lab notified to add on ddimer

## 2015-12-14 NOTE — ED Provider Notes (Signed)
[facility] Emergency Department Provider Note  ____________________________________________  Time seen: Approximately 10:00 PM  I have reviewed the triage vital signs and the nursing notes.   HISTORY  Chief Complaint Chest Pain   HPI John Gregory is a 50 y.o. male history of hypertension, polycystic kidney disease who presents for evaluation of chest pain. Patient reports that he was working on his car when he developed sudden onset of left-sided pleuritic sharp chest pain radiating into his left shoulder. He reports that he felt short of breath, lightheaded, diaphoretic. The pain initially was 10 out of 10 and has now subsided. He reports he ran out he has no pain unless he coughs or takes a deep breath. He reports that he was not coughing into the pain started. The cough is dry. He denies any personal or family history of blood clots, recent travel or immobilization, leg pain or swelling, exogenous hormones, hemoptysis. He endorse ischemic heart disease in his grandfather. He is not a smoker, denies drugs, according to him had a stress test many years ago in [place] where he is from which was negative.   Past Medical History:  Diagnosis Date  . Hypertension   . Kidney stone   . Renal disorder     There are no active problems to display for this patient.   Past Surgical History:  Procedure Laterality Date  . SHOULDER SURGERY Left   . WRIST SURGERY Left     Prior to Admission medications   Medication Sig Start Date End Date Taking? Authorizing Provider  amLODipine (NORVASC) 10 MG tablet  10/20/15   Historical Provider, MD  benazepril-hydrochlorthiazide (LOTENSIN HCT) 20-12.5 MG tablet Take by mouth.    Historical Provider, MD  etodolac (LODINE) 400 MG tablet Take 1 tablet (400 mg total) by mouth 2 (two) times daily. 02/13/15   Evon Slackhomas C Gaines, PA-C  naproxen (NAPROSYN) 500 MG tablet  08/06/15   Historical Provider, MD  predniSONE (DELTASONE) 10  MG tablet Take 3 tablets for 3 days 06/05/15   Tommi Rumpshonda L Solberg, PA-C  traMADol (ULTRAM) 50 MG tablet Take 1 tablet (50 mg total) by mouth every 6 (six) hours as needed. 07/21/15 07/20/16  Emily FilbertJonathan E Williams, MD    Allergies Calcium-containing compounds and Penicillins  No family history on file.  Social History Social History  Substance Use Topics  . Smoking status: Never Smoker  . Smokeless tobacco: Never Used  . Alcohol use No    Review of Systems  Constitutional: Negative for fever. Eyes: Negative for visual changes. ENT: Negative for sore throat. Cardiovascular: + L sided chest pain. Respiratory: + shortness of breath and cough Gastrointestinal: Negative for abdominal pain, vomiting or diarrhea. Genitourinary: Negative for dysuria. Musculoskeletal: Negative for back pain. Skin: Negative for rash. Neurological: Negative for headaches, weakness or numbness.  ____________________________________________   PHYSICAL EXAM:  VITAL SIGNS: ED Triage Vitals [12/14/15 2122]  Enc Vitals Group     BP (!) 157/100     Pulse Rate (!) 59     Resp 20     Temp 97.9 F (36.6 C)     Temp Source Oral     SpO2 100 %     Weight 145 lb (65.8 kg)     Height 5\' 10"  (1.778 m)     Head Circumference      Peak Flow      Pain Score 8     Pain Loc      Pain Edu?  Excl. in GC?     Constitutional: Alert and oriented. Well appearing and in no apparent distress. HEENT:      Head: Normocephalic and atraumatic.         Eyes: Conjunctivae are normal. Sclera is non-icteric. EOMI. PERRL      Mouth/Throat: Mucous membranes are moist.       Neck: Supple with no signs of meningismus. Cardiovascular: Regular rate and rhythm. No murmurs, gallops, or rubs. 2+ symmetrical distal pulses are present in all extremities. No JVD. Respiratory: Normal respiratory effort. Lungs are clear to auscultation bilaterally. No wheezes, crackles, or rhonchi.  Gastrointestinal: Soft, non tender, and non  distended with positive bowel sounds. No rebound or guarding. Genitourinary: No CVA tenderness. Musculoskeletal: Nontender with normal range of motion in all extremities. No edema, cyanosis, or erythema of extremities. Neurologic: Normal speech and language. Face is symmetric. Moving all extremities. No gross focal neurologic deficits are appreciated. Skin: Skin is warm, dry and intact. No rash noted. Psychiatric: Mood and affect are normal. Speech and behavior are normal.  ____________________________________________   LABS (all labs ordered are listed, but only abnormal results are displayed)  Labs Reviewed  COMPREHENSIVE METABOLIC PANEL - Abnormal; Notable for the following:       Result Value   Potassium 3.1 (*)    Creatinine, Ser 1.88 (*)    ALT 13 (*)    GFR calc non Af Amer 41 (*)    GFR calc Af Amer 47 (*)    All other components within normal limits  CBC WITH DIFFERENTIAL/PLATELET - Abnormal; Notable for the following:    Hemoglobin 12.6 (*)    HCT 36.6 (*)    All other components within normal limits  TROPONIN I  FIBRIN DERIVATIVES D-DIMER ([facility] ONLY)  TROPONIN I   ____________________________________________  EKG  ED ECG REPORT I, Nita Sickle, the attending physician, personally viewed and interpreted this ECG. Sinus bradycardia, rate of 59, normal intervals, normal axis, no ST elevations or depressions. ____________________________________________  RADIOLOGY  CXR: negative  ____________________________________________   PROCEDURES  Procedure(s) performed: None Procedures Critical Care performed:  None ____________________________________________   INITIAL IMPRESSION / ASSESSMENT AND PLAN / ED COURSE  50 y.o. male history of hypertension, polycystic kidney disease who presents for evaluation of sudden onset pleuritic chest pain associated with diaphoresis and lightheadedness. Patient has had dry cough since pain started and currently only has  pain when he coughs or takes a deep breath. Strong symmetric pulses bilaterally, no neuro deficits, low suspicion for dissection. Will give ASA. Check serial cardiac markers, d-dimer, and observe on telemetry.  Clinical Course   11:48PM 2nd troponin and d-dimer pending. Patient stable. Care transferred to Dr. Inocencio Homes.  Pertinent labs & imaging results that were available during my care of the patient were reviewed by me and considered in my medical decision making (see chart for details).    ____________________________________________   FINAL CLINICAL IMPRESSION(S) / ED DIAGNOSES  Final diagnoses:  Chest pain, unspecified chest pain type      NEW MEDICATIONS STARTED DURING THIS VISIT:  Discharge Medication List as of 12/15/2015  2:20 AM       Note:  This document was prepared using Dragon voice recognition software and may include unintentional dictation errors.    Nita Sickle, MD 12/15/15 1239

## 2015-12-14 NOTE — ED Notes (Signed)
Patient transported to X-ray 

## 2015-12-14 NOTE — ED Notes (Signed)
Pt in via triage with complaints of left side chest pain radiating into left shoulder x approximately one hour.  Pt states he was working on a car when pain came on, reports associated sob, weakness, dizziness, nausea.  Pt reports cough since chest pain, states, "I feel like there is something stuck in my throat."  Pt A/Ox4, able to speak full sentences, no signs of distress noted.

## 2015-12-15 LAB — TROPONIN I: Troponin I: <0.03 ng/mL (ref <0.03)

## 2015-12-15 MED ORDER — POTASSIUM CHLORIDE CRYS ER 20 MEQ PO TBCR
20.0000 meq | EXTENDED_RELEASE_TABLET | Freq: Once | ORAL | Status: AC
  Administered 2015-12-15: 20 meq via ORAL
  Filled 2015-12-15: qty 1

## 2015-12-15 NOTE — ED Notes (Signed)
Pt discharged to home.  Discharge instructions reviewed.  Verbalized understanding.  No questions or concerns at this time.  Teach back verified.  Pt in NAD.  No items left in ED.   

## 2015-12-15 NOTE — ED Provider Notes (Signed)
-----------------------------------------   2:16 AM on 12/15/2015 -----------------------------------------  Care was assumed from Dr. Don PerkingVeronese at 12 AM pending results of d-dimer and second troponin. Essentially, this is a 50 year old male presenting with left chest pain is evening. D-dimer is not elevated, not consistent with PE. 2 sets of cardiac enzymes are negative, not consistent with ACS. Clinical picture is not thought to be consistent with acute aortic dissection. Patient has no pain at this time, he is resting comfortably with no complaints. We discussed meticulous return precautions and need for close PCP follow-up and he is comfortable with the discharge plan. DC home.   Gayla DossEryka A Phillis Thackeray, MD 12/15/15 [phone]

## 2016-03-28 ENCOUNTER — Encounter: Payer: Self-pay | Admitting: Emergency Medicine

## 2016-03-28 ENCOUNTER — Emergency Department
Admission: EM | Admit: 2016-03-28 | Discharge: 2016-03-28 | Disposition: A | Discharge status: Home / Self Care | Payer: Commercial Managed Care - PPO | Attending: Emergency Medicine | Admitting: Emergency Medicine

## 2016-03-28 ENCOUNTER — Emergency Department: Payer: Commercial Managed Care - PPO

## 2016-03-28 DIAGNOSIS — I10 Essential (primary) hypertension: Secondary | ICD-10-CM | POA: Diagnosis not present

## 2016-03-28 DIAGNOSIS — Z79899 Other long term (current) drug therapy: Secondary | ICD-10-CM | POA: Insufficient documentation

## 2016-03-28 DIAGNOSIS — L03221 Cellulitis of neck: Secondary | ICD-10-CM | POA: Diagnosis not present

## 2016-03-28 DIAGNOSIS — R221 Localized swelling, mass and lump, neck: Secondary | ICD-10-CM | POA: Diagnosis present

## 2016-03-28 DIAGNOSIS — L0291 Cutaneous abscess, unspecified: Secondary | ICD-10-CM

## 2016-03-28 LAB — CBC WITH DIFFERENTIAL/PLATELET
Basophils Absolute: 0 10*3/uL (ref 0–0.1)
Basophils Relative: 1 %
Eosinophils Absolute: 0.3 10*3/uL (ref 0–0.7)
Eosinophils Relative: 5 %
HEMATOCRIT: 38.3 % — AB (ref 40.0–52.0)
Hemoglobin: 13 g/dL (ref 13.0–18.0)
LYMPHS PCT: 21 %
Lymphs Abs: 1.4 10*3/uL (ref 1.0–3.6)
MCH: 27.4 pg (ref 26.0–34.0)
MCHC: 34 g/dL (ref 32.0–36.0)
MCV: 80.6 fL (ref 80.0–100.0)
MONO ABS: 0.4 10*3/uL (ref 0.2–1.0)
MONOS PCT: 6 %
NEUTROS ABS: 4.7 10*3/uL (ref 1.4–6.5)
Neutrophils Relative %: 67 %
Platelets: 221 10*3/uL (ref 150–440)
RBC: 4.75 MIL/uL (ref 4.40–5.90)
RDW: 13.3 % (ref 11.5–14.5)
WBC: 6.9 10*3/uL (ref 3.8–10.6)

## 2016-03-28 LAB — BASIC METABOLIC PANEL
ANION GAP: 6 (ref 5–15)
BUN: 18 mg/dL (ref 6–20)
CHLORIDE: 104 mmol/L (ref 101–111)
CO2: 29 mmol/L (ref 22–32)
CREATININE: 1.83 mg/dL — AB (ref 0.61–1.24)
Calcium: 9.4 mg/dL (ref 8.9–10.3)
GFR calc non Af Amer: 42 mL/min — ABNORMAL LOW (ref >60)
GFR, EST AFRICAN AMERICAN: 49 mL/min — AB (ref >60)
Glucose, Bld: 93 mg/dL (ref 65–99)
POTASSIUM: 4 mmol/L (ref 3.5–5.1)
Sodium: 139 mmol/L (ref 135–145)

## 2016-03-28 LAB — POCT RAPID STREP A: Streptococcus, Group A Screen (Direct): NEGATIVE

## 2016-03-28 MED ORDER — SODIUM CHLORIDE 0.9 % IV BOLUS (SEPSIS)
1000.0000 mL | Freq: Once | INTRAVENOUS | Status: AC
  Administered 2016-03-28: 1000 mL via INTRAVENOUS

## 2016-03-28 MED ORDER — CLINDAMYCIN HCL 300 MG PO CAPS: 300.0000 mg | ORAL_CAPSULE | Freq: Three times a day (TID) | ORAL | 0 refills | Status: DC

## 2016-03-28 MED ORDER — OXYCODONE-ACETAMINOPHEN 5-325 MG PO TABS: 1.0000 | ORAL_TABLET | ORAL | 0 refills | Status: DC | PRN

## 2016-03-28 MED ORDER — IOPAMIDOL (ISOVUE-300) INJECTION 61%
75.0000 mL | Freq: Once | INTRAVENOUS | Status: AC | PRN
Start: 2016-03-28 — End: 2016-03-28
  Administered 2016-03-28: 60 mL via INTRAVENOUS

## 2016-03-28 MED ORDER — CLINDAMYCIN PHOSPHATE 600 MG/50ML IV SOLN
600.0000 mg | Freq: Once | INTRAVENOUS | Status: AC
  Administered 2016-03-28: 600 mg via INTRAVENOUS
  Filled 2016-03-28: qty 50

## 2016-03-28 MED ORDER — MORPHINE SULFATE (PF) 4 MG/ML IV SOLN
4.0000 mg | Freq: Once | INTRAVENOUS | Status: AC
  Administered 2016-03-28: 4 mg via INTRAVENOUS
  Filled 2016-03-28: qty 1

## 2016-03-28 MED ORDER — ONDANSETRON HCL 4 MG/2ML IJ SOLN
4.0000 mg | Freq: Once | INTRAMUSCULAR | Status: AC
  Administered 2016-03-28: 4 mg via INTRAVENOUS
  Filled 2016-03-28: qty 2

## 2016-03-28 NOTE — ED Provider Notes (Signed)
[facility] Emergency Department Provider Note   ____________________________________________   First MD Initiated Contact with Patient 03/28/16 [phone]     (approximate)  I have reviewed the triage vital signs and the nursing notes.   HISTORY  Chief Complaint Abscess    HPI John Gregory is a 51 y.o. male who presents to the ED from home with a chief complaint of neck swelling. Patient reports area of swelling to the left side of his neck for the past several months. Area increased over the past several days. Tonight he noted another smaller knot to the back of his left ear. Patient is more concerned for the smaller area and that is why he presents tonight for evaluation. Reports recent sore throat.Denies associated fever, chills, chest pain, shortness of breath, abdominal pain, nausea, vomiting, diarrhea. Denies recent travel trauma. Nothing makes his symptoms better. Palpation makes his pain worse.   Past Medical History:  Diagnosis Date  . Hypertension   . Kidney stone   . Renal disorder     There are no active problems to display for this patient.   Past Surgical History:  Procedure Laterality Date  . ANKLE SURGERY Left   . KNEE SURGERY Bilateral   . SHOULDER SURGERY Left   . WRIST SURGERY Left     Prior to Admission medications   Medication Sig Start Date End Date Taking? Authorizing Provider  amLODipine (NORVASC) 10 MG tablet  10/20/15   Historical Provider, MD  benazepril-hydrochlorthiazide (LOTENSIN HCT) 20-12.5 MG tablet Take by mouth.    Historical Provider, MD  clindamycin (CLEOCIN) 300 MG capsule Take 1 capsule (300 mg total) by mouth 3 (three) times daily. 03/28/16   Irean Hong, MD  etodolac (LODINE) 400 MG tablet Take 1 tablet (400 mg total) by mouth 2 (two) times daily. 02/13/15   Evon Slack, PA-C  naproxen (NAPROSYN) 500 MG tablet  08/06/15   Historical Provider, MD  oxyCODONE-acetaminophen (ROXICET) 5-325 MG tablet Take 1  tablet by mouth every 4 (four) hours as needed for severe pain. 03/28/16   Irean Hong, MD  predniSONE (DELTASONE) 10 MG tablet Take 3 tablets for 3 days 06/05/15   Tommi Rumps, PA-C  traMADol (ULTRAM) 50 MG tablet Take 1 tablet (50 mg total) by mouth every 6 (six) hours as needed. 07/21/15 07/20/16  Emily Filbert, MD    Allergies Calcium-containing compounds and Penicillins  No family history on file.  Social History Social History  Substance Use Topics  . Smoking status: Never Smoker  . Smokeless tobacco: Never Used  . Alcohol use No    Review of Systems  Constitutional: No fever/chills. Eyes: No visual changes. ENT: Positive for swelling to left neck.. No sore throat. Cardiovascular: Denies chest pain. Respiratory: Denies shortness of breath. Gastrointestinal: No abdominal pain.  No nausea, no vomiting.  No diarrhea.  No constipation. Genitourinary: Negative for dysuria. Musculoskeletal: Negative for back pain. Skin: Negative for rash. Neurological: Negative for headaches, focal weakness or numbness.  10-point ROS otherwise negative.  ____________________________________________   PHYSICAL EXAM:  VITAL SIGNS: ED Triage Vitals  Enc Vitals Group     BP 03/28/16 0309 (!) 182/93     Pulse Rate 03/28/16 0309 76     Resp 03/28/16 0309 18     Temp 03/28/16 0309 98.7 F (37.1 C)     Temp src --      SpO2 03/28/16 0309 99 %     Weight 03/28/16 0310  145 lb (65.8 kg)     Height 03/28/16 0310 5\' 10"  (1.778 m)     Head Circumference --      Peak Flow --      Pain Score 03/28/16 0310 10     Pain Loc --      Pain Edu? --      Excl. in GC? --     Constitutional: Alert and oriented. Well appearing and in no acute distress. Eyes: Conjunctivae are normal. PERRL. EOMI. Head: Atraumatic. Nose: No congestion/rhinnorhea. Mouth/Throat: Mucous membranes are moist.  Oropharynx erythematous without tonsillar exudate, swelling or peritonsillar abscess.  There is no hoarse  or muffled voice.  There is no drooling. Neck: No stridor.  Supple neck without meningismus. Hematological/Lymphatic/Immunilogical: Acorn-sized area of swelling to left lateral neck which is tender to palpation. No associated warmth or erythema. Small area of induration. Difficult to determine fluctuance as patient is not very cooperative with exam. Pea-sized palpable left posterior auricular lymph node.  Cardiovascular: Normal rate, regular rhythm. Grossly normal heart sounds.  Good peripheral circulation. Respiratory: Normal respiratory effort.  No retractions. Lungs CTAB. Gastrointestinal: Soft and nontender. No distention. No abdominal bruits. No CVA tenderness. Musculoskeletal: No lower extremity tenderness nor edema.  No joint effusions. Neurologic:  Normal speech and language. No gross focal neurologic deficits are appreciated. No gait instability. Skin:  Skin is warm, dry and intact. No rash noted. Psychiatric: Mood and affect are normal. Speech and behavior are normal.  ____________________________________________   LABS (all labs ordered are listed, but only abnormal results are displayed)  Labs Reviewed  CBC WITH DIFFERENTIAL/PLATELET - Abnormal; Notable for the following:       Result Value   HCT 38.3 (*)    All other components within normal limits  BASIC METABOLIC PANEL - Abnormal; Notable for the following:    Creatinine, Ser 1.83 (*)    GFR calc non Af Amer 42 (*)    GFR calc Af Amer 49 (*)    All other components within normal limits  POCT RAPID STREP A   ____________________________________________  EKG  None ____________________________________________  RADIOLOGY  CT neck interpreted per Dr. Karie Kirks:  11 x 8 mm superficial LEFT lateral neck abscess and effusion. Focal  thickening/ cellulitis LEFT posterior auricular soft tissues.   ____________________________________________   PROCEDURES  Procedure(s) performed: None  Procedures  Critical Care  performed: No  ____________________________________________   INITIAL IMPRESSION / ASSESSMENT AND PLAN / ED COURSE  Pertinent labs & imaging results that were available during my care of the patient were reviewed by me and considered in my medical decision making (see chart for details).  51 year old male who presents with left lateral neck mass x several months, increased in size over the past several days. Also with left posterior auricular lymphadenopathy. Will obtain CT scan to evaluate for abscess versus lymphadenitis.  Clinical Course as of Mar 28 714  Tue Mar 28, 2016  1610 Updated patient of CT imaging study. Attempted topical anesthetic with needle I&D with 18-gauge needle; patient tolerated poorly. Will give IV dose clindamycin in the ED and refer patient to ENT for definitive I&D given its proximity overlying the carotid sheath. Strict return precautions given. Patient verbalizes understanding and agrees with plan of care.  [JS]    Clinical Course User Index [JS] Irean Hong, MD     ____________________________________________   FINAL CLINICAL IMPRESSION(S) / ED DIAGNOSES  Final diagnoses:  Abscess  Cellulitis of neck  NEW MEDICATIONS STARTED DURING THIS VISIT:  New Prescriptions   CLINDAMYCIN (CLEOCIN) 300 MG CAPSULE    Take 1 capsule (300 mg total) by mouth 3 (three) times daily.   OXYCODONE-ACETAMINOPHEN (ROXICET) 5-325 MG TABLET    Take 1 tablet by mouth every 4 (four) hours as needed for severe pain.     Note:  This document was prepared using Dragon voice recognition software and may include unintentional dictation errors.    Irean HongJade J Sung, MD 03/28/16 [phone]

## 2016-03-28 NOTE — Discharge Instructions (Signed)
1. Take antibiotic as prescribed (clindamycin 300 mg 3 times daily 10 days). 2. You may take pain medicine as needed (Percocet). 3. Apply moist heat to affected area several times daily. 4. Return to the ER for worsening symptoms, persistent vomiting, fever or other concerns.

## 2016-03-28 NOTE — ED Notes (Signed)

## 2016-03-28 NOTE — ED Notes (Signed)
Patient returned from CT

## 2016-03-28 NOTE — ED Triage Notes (Signed)
Patient ambulatory to triage with steady gait, without difficulty or distress noted; pt reports area of swelling to left side of neck and behind left ear couple months

## 2016-03-28 NOTE — ED Notes (Signed)
Patient states he has abscess/cyst that has been there for 2 months, but the one behind his ear he just noticed tonight.  Pt denies any sensation of having difficulty breathing but the pain is starting to get to him.  He says he has not sought treatment because it didn't hurt this much before.

## 2016-03-28 NOTE — ED Notes (Signed)
EDP at bedside attempting to lance the abscess on left neck with 18G needle.

## 2016-03-28 NOTE — ED Notes (Addendum)
Patient transported to CT 

## 2016-03-31 ENCOUNTER — Encounter: Payer: Self-pay | Admitting: Emergency Medicine

## 2016-03-31 ENCOUNTER — Emergency Department
Admission: EM | Admit: 2016-03-31 | Discharge: 2016-03-31 | Disposition: A | Discharge status: Home / Self Care | Payer: Commercial Managed Care - PPO | Attending: Emergency Medicine | Admitting: Emergency Medicine

## 2016-03-31 DIAGNOSIS — I10 Essential (primary) hypertension: Secondary | ICD-10-CM | POA: Diagnosis not present

## 2016-03-31 DIAGNOSIS — Z79899 Other long term (current) drug therapy: Secondary | ICD-10-CM | POA: Diagnosis not present

## 2016-03-31 DIAGNOSIS — L720 Epidermal cyst: Secondary | ICD-10-CM | POA: Insufficient documentation

## 2016-03-31 MED ORDER — KETOROLAC TROMETHAMINE 60 MG/2ML IM SOLN
60.0000 mg | Freq: Once | INTRAMUSCULAR | Status: AC
  Administered 2016-03-31: 60 mg via INTRAMUSCULAR
  Filled 2016-03-31: qty 2

## 2016-03-31 MED ORDER — LIDOCAINE-EPINEPHRINE 2 %-1:100000 IJ SOLN
20.0000 mL | Freq: Once | INTRAMUSCULAR | Status: AC
  Administered 2016-03-31: 20 mL via INTRADERMAL
  Filled 2016-03-31 (×2): qty 20

## 2016-03-31 MED ORDER — HYDROMORPHONE HCL 1 MG/ML IJ SOLN
1.0000 mg | Freq: Once | INTRAMUSCULAR | Status: AC
  Administered 2016-03-31: 1 mg via INTRAMUSCULAR
  Filled 2016-03-31: qty 1

## 2016-03-31 MED ORDER — PENTAFLUOROPROP-TETRAFLUOROETH EX AERO
INHALATION_SPRAY | CUTANEOUS | Status: DC | PRN
  Administered 2016-03-31: 30 via TOPICAL
  Filled 2016-03-31: qty 30

## 2016-03-31 NOTE — ED Notes (Signed)
approx golf size area of swelling to left side of neck. Pt has been seen for same and placed on antibiotics. Pain and swelling worsening. RR even and unlabored. Pt talks in full and complete sentences.

## 2016-03-31 NOTE — ED Notes (Addendum)
Pt tolerated procedure well. Area covered with sterile gauze. Pt instructed to keep area clean and covered.

## 2016-03-31 NOTE — ED Notes (Signed)
Pt alert and oriented X4, active, cooperative, pt in NAD. RR even and unlabored, color WNL.  Pt informed to return if any life threatening symptoms occur.   

## 2016-03-31 NOTE — ED Triage Notes (Signed)
Patient to the ED for c/o cyst on left side of neck, patient was seen in our ED recently for same complaint. Patient followed up with Dr. Jenne CampusMcqueen and was given antibiotics, Patient was told to call back today and let them know how he was. Patient states that the area has gotten bigger. Patient was told to come to the ED for I & D.

## 2016-03-31 NOTE — ED Notes (Signed)
Awaiting bed for patient.  

## 2016-03-31 NOTE — ED Notes (Signed)
Pt to be seen by MD on major side of ER. Charge RN aware that patient needs bed.

## 2016-03-31 NOTE — ED Provider Notes (Signed)
[facility] Emergency Department Provider Note  ____________________________________________  Time seen: Approximately 12:00 AM  I have reviewed the triage vital signs and the nursing notes.   HISTORY  Chief Complaint Cyst   HPI John Gregory is a 51 y.o. male presents to the emergency department for evaluation of a large cystic structure on the left side of his neck.He was evaluated here on 03/28/2016 for the same. At that time CT scan showed a fluctuant abscess that measured 11 x 8 mm. He states that he was referred to the ENT doctor and was evaluated. He was advised to take the antibiotic for 3 days and call if the area increased in size. He states that he called the ENT doctor today and advised that it had increased significantly in size and the ENT doctor told him to come to the emergency department for I and D or to have him paged to give the ER was unable to do the I and D.  Past Medical History:  Diagnosis Date  . Hypertension   . Kidney stone   . Renal disorder     There are no active problems to display for this patient.   Past Surgical History:  Procedure Laterality Date  . ANKLE SURGERY Left   . KNEE SURGERY Bilateral   . SHOULDER SURGERY Left   . WRIST SURGERY Left     Prior to Admission medications   Medication Sig Start Date End Date Taking? Authorizing Provider  amLODipine (NORVASC) 10 MG tablet  10/20/15   Historical Provider, MD  benazepril-hydrochlorthiazide (LOTENSIN HCT) 20-12.5 MG tablet Take by mouth.    Historical Provider, MD  clindamycin (CLEOCIN) 300 MG capsule Take 1 capsule (300 mg total) by mouth 3 (three) times daily. 03/28/16   Irean HongJade J Sung, MD  etodolac (LODINE) 400 MG tablet Take 1 tablet (400 mg total) by mouth 2 (two) times daily. 02/13/15   Evon Slackhomas C Gaines, PA-C  naproxen (NAPROSYN) 500 MG tablet  08/06/15   Historical Provider, MD  oxyCODONE-acetaminophen (ROXICET) 5-325 MG tablet Take 1 tablet by mouth every 4  (four) hours as needed for severe pain. 03/28/16   Irean HongJade J Sung, MD  predniSONE (DELTASONE) 10 MG tablet Take 3 tablets for 3 days 06/05/15   Tommi Rumpshonda L Stetzel, PA-C  traMADol (ULTRAM) 50 MG tablet Take 1 tablet (50 mg total) by mouth every 6 (six) hours as needed. 07/21/15 07/20/16  Emily FilbertJonathan E Williams, MD    Allergies Calcium-containing compounds and Penicillins  No family history on file.  Social History Social History  Substance Use Topics  . Smoking status: Never Smoker  . Smokeless tobacco: Never Used  . Alcohol use No    Review of Systems  Constitutional: Negative for fever/chills Respiratory: Negative for shortness of breath. Musculoskeletal: Negative for pain. Skin: Positive for large abscess on the left lateral neck Neurological: Negative for headaches, focal weakness or numbness. ____________________________________________   PHYSICAL EXAM:  VITAL SIGNS: ED Triage Vitals  Enc Vitals Group     BP 03/31/16 1854 (!) 167/112     Pulse Rate 03/31/16 1854 71     Resp 03/31/16 1854 16     Temp 03/31/16 1854 98.2 F (36.8 C)     Temp Source 03/31/16 1854 Oral     SpO2 03/31/16 1854 100 %     Weight 03/31/16 1853 145 lb (65.8 kg)     Height 03/31/16 1853 5\' 10"  (1.778 m)     Head Circumference --  Peak Flow --      Pain Score 03/31/16 1853 10     Pain Loc --      Pain Edu? --      Excl. in GC? --      Constitutional: Alert and oriented. Well appearing and in no acute distress. Eyes: Conjunctivae are normal. EOMI. Nose: No congestion/rhinnorhea. Mouth/Throat: Mucous membranes are moist.   Neck: No stridor. Lymphatic: No cervical lymphadenopathy. Cardiovascular: Good peripheral circulation. Respiratory: Normal respiratory effort.  No retractions. Musculoskeletal: FROM throughout. Neurologic:  Normal speech and language. No gross focal neurologic deficits are appreciated. Skin:  6 cm fluctuant and indurated abscess to the left lateral  neck.  ____________________________________________   LABS (all labs ordered are listed, but only abnormal results are displayed)  Labs Reviewed - No data to display ____________________________________________  EKG   ____________________________________________  RADIOLOGY  Not indicated ____________________________________________   PROCEDURES  Procedure(s) performed: INCISION AND DRAINAGE Performed by: Kem Boroughs Consent: Verbal consent obtained. Risks and benefits: risks, benefits and alternatives were discussed Type: abscess  Body area: left lateral neck.  Anesthesia: local infiltration  Incision was made with a scalpel.  Local anesthetic: lidocaine 2% with epinephrine  Anesthetic total: 6 ml  Complexity: complex  Non aggressive blunt dissection to break up loculations  Drainage: purulent  Drainage amount: copious  Packing material: 1/4 in iodoform gauze  Patient tolerance: Patient tolerated the procedure well with no immediate complications.    ____________________________________________   INITIAL IMPRESSION / ASSESSMENT AND PLAN / ED COURSE  Clinical Course     Pertinent labs & imaging results that were available during my care of the patient were reviewed by me and considered in my medical decision making (see chart for details).  51 year old male presenting to the emergency department for incision and drainage of an abscess to the left lateral neck. Patient tolerated the procedure well and will continue his antibiotics as prescribed. He was instructed to follow-up with the ENT doctor in 3 days for reevaluation and packing removal. He was advised to return to the emergency department for any symptom that changes or worsens.  ____________________________________________   FINAL CLINICAL IMPRESSION(S) / ED DIAGNOSES  Final diagnoses:  Epidermal cyst of neck    Discharge Medication List as of 03/31/2016 10:33 PM      Note:  This  document was prepared using Dragon voice recognition software and may include unintentional dictation errors.    Chinita Pester, FNP 04/01/16 0009    Jeanmarie Plant, MD 04/01/16 [phone]

## 2016-03-31 NOTE — ED Notes (Signed)
ED Provider at bedside. 

## 2016-04-23 ENCOUNTER — Emergency Department: Payer: Commercial Managed Care - PPO

## 2016-04-23 ENCOUNTER — Emergency Department
Admission: EM | Admit: 2016-04-23 | Discharge: 2016-04-23 | Disposition: A | Discharge status: Home / Self Care | Payer: Commercial Managed Care - PPO | Attending: Emergency Medicine | Admitting: Emergency Medicine

## 2016-04-23 DIAGNOSIS — Z79899 Other long term (current) drug therapy: Secondary | ICD-10-CM | POA: Insufficient documentation

## 2016-04-23 DIAGNOSIS — I10 Essential (primary) hypertension: Secondary | ICD-10-CM | POA: Diagnosis not present

## 2016-04-23 DIAGNOSIS — R109 Unspecified abdominal pain: Secondary | ICD-10-CM

## 2016-04-23 HISTORY — DX: Polycystic kidney, unspecified: Q61.3

## 2016-04-23 LAB — COMPREHENSIVE METABOLIC PANEL
ALK PHOS: 52 U/L (ref 38–126)
ALT: 14 U/L — AB (ref 17–63)
ANION GAP: 6 (ref 5–15)
AST: 23 U/L (ref 15–41)
Albumin: 4 g/dL (ref 3.5–5.0)
BILIRUBIN TOTAL: 0.7 mg/dL (ref 0.3–1.2)
BUN: 21 mg/dL — ABNORMAL HIGH (ref 6–20)
CALCIUM: 8.8 mg/dL — AB (ref 8.9–10.3)
CO2: 29 mmol/L (ref 22–32)
CREATININE: 1.92 mg/dL — AB (ref 0.61–1.24)
Chloride: 103 mmol/L (ref 101–111)
GFR, EST AFRICAN AMERICAN: 46 mL/min — AB (ref >60)
GFR, EST NON AFRICAN AMERICAN: 40 mL/min — AB (ref >60)
Glucose, Bld: 86 mg/dL (ref 65–99)
Potassium: 3.5 mmol/L (ref 3.5–5.1)
Sodium: 138 mmol/L (ref 135–145)
TOTAL PROTEIN: 7.2 g/dL (ref 6.5–8.1)

## 2016-04-23 LAB — CBC
HCT: 36.9 % — ABNORMAL LOW (ref 40.0–52.0)
HEMOGLOBIN: 12.7 g/dL — AB (ref 13.0–18.0)
MCH: 28.1 pg (ref 26.0–34.0)
MCHC: 34.4 g/dL (ref 32.0–36.0)
MCV: 81.7 fL (ref 80.0–100.0)
PLATELETS: 228 10*3/uL (ref 150–440)
RBC: 4.52 MIL/uL (ref 4.40–5.90)
RDW: 13.2 % (ref 11.5–14.5)
WBC: 4.1 10*3/uL (ref 3.8–10.6)

## 2016-04-23 LAB — URINALYSIS, COMPLETE (UACMP) WITH MICROSCOPIC
BILIRUBIN URINE: NEGATIVE
Bacteria, UA: NONE SEEN
GLUCOSE, UA: NEGATIVE mg/dL
Hgb urine dipstick: NEGATIVE
KETONES UR: NEGATIVE mg/dL
LEUKOCYTES UA: NEGATIVE
Nitrite: NEGATIVE
PH: 6 (ref 5.0–8.0)
Protein, ur: NEGATIVE mg/dL
SPECIFIC GRAVITY, URINE: 1.012 (ref 1.005–1.030)
SQUAMOUS EPITHELIAL / LPF: NONE SEEN

## 2016-04-23 LAB — LIPASE, BLOOD: LIPASE: 47 U/L (ref 11–51)

## 2016-04-23 MED ORDER — ACETAMINOPHEN 325 MG PO TABS: 650.0000 mg | ORAL_TABLET | Freq: Once | ORAL | Status: DC

## 2016-04-23 MED ORDER — SODIUM CHLORIDE 0.9 % IV BOLUS (SEPSIS)
1000.0000 mL | Freq: Once | INTRAVENOUS | Status: AC
  Administered 2016-04-23: 1000 mL via INTRAVENOUS

## 2016-04-23 MED ORDER — ACETAMINOPHEN 500 MG PO TABS
1000.0000 mg | ORAL_TABLET | Freq: Once | ORAL | Status: AC
  Administered 2016-04-23: 1000 mg via ORAL
  Filled 2016-04-23: qty 2

## 2016-04-23 NOTE — ED Notes (Signed)
Pt to CT

## 2016-04-23 NOTE — ED Provider Notes (Signed)
Time Seen: Approximately 1849 I have reviewed the triage notes  Chief Complaint: Flank Pain   History of Present Illness: John Gregory is a 51 y.o. male *who has a history of polycystic kidney disease and kidney stones and describes a 24-hour history of some left-sided flank pressure and some pain radiation toward the left groin and testicle. The patient reports some urinary frequency and also incomplete voiding. He denies any fever, persistent vomiting, right-sided abdominal or flank pain. He denies any difficulty with bowel function or lower back pain etc.   Past Medical History:  Diagnosis Date  . Hypertension   . Kidney stone   . Polycystic kidney disease   . Renal disorder     There are no active problems to display for this patient.   Past Surgical History:  Procedure Laterality Date  . ANKLE SURGERY Left   . KNEE SURGERY Bilateral   . SHOULDER SURGERY Left   . WRIST SURGERY Left     Past Surgical History:  Procedure Laterality Date  . ANKLE SURGERY Left   . KNEE SURGERY Bilateral   . SHOULDER SURGERY Left   . WRIST SURGERY Left     Current Outpatient Rx  . Order #: [id number] Class: Historical Med  . Order #: [id number] Class: Historical Med  . Order #: [id number] Class: Print  . Order #: [id number] Class: Print  . Order #: [id number] Class: Historical Med  . Order #: [id number] Class: Print  . Order #: [id number] Class: Print  . Order #: [id number] Class: Print    Allergies:  Calcium-containing compounds and Penicillins  Family History: No family history on file.  Social History: Social History  Substance Use Topics  . Smoking status: Never Smoker  . Smokeless tobacco: Never Used  . Alcohol use No     Review of Systems:   10 point review of systems was performed and was otherwise negative:  Constitutional: No fever Eyes: No visual disturbances ENT: No sore throat, ear pain Cardiac: No chest pain Respiratory: No shortness of breath, wheezing, or  stridor Abdomen: No abdominal pain, no vomiting, No diarrhea Endocrine: No weight loss, No night sweats Extremities: No peripheral edema, cyanosis Skin: No rashes, easy bruising Neurologic: No focal weakness, trouble with speech or swollowing Urologic: No dysuria, Hematuria, or urinary frequency   Physical Exam:  ED Triage Vitals [04/23/16 1559]  Enc Vitals Group     BP (!) 170/115     Pulse Rate (!) 7     Resp 16     Temp 98.5 F (36.9 C)     Temp Source Oral     SpO2 100 %     Weight 140 lb (63.5 kg)     Height 5\' 10"  (1.778 m)     Head Circumference      Peak Flow      Pain Score 8     Pain Loc      Pain Edu?      Excl. in GC?     General: Awake , Alert , and Oriented times 3; GCS 15 Head: Normal cephalic , atraumatic Eyes: Pupils equal , round, reactive to light Nose/Throat: No nasal drainage, patent upper airway without erythema or exudate.  Neck: Supple, Full range of motion, No anterior adenopathy or palpable thyroid masses Lungs: Clear to ascultation without wheezes , rhonchi, or rales Heart: Regular rate, regular rhythm without murmurs , gallops , or rubs Abdomen: Soft, non tender without rebound, guarding , or rigidity; bowel sounds positive and symmetric in all  4 quadrants. No organomegaly .        Extremities: 2 plus symmetric pulses. No edema, clubbing or cyanosis Neurologic: normal ambulation, Motor symmetric without deficits, sensory intact Skin: warm, dry, no rashes Patient has no testicular pain or masses and no indications of testicular torsion.  Labs:   All laboratory work was reviewed including any pertinent negatives or positives listed below:  Labs Reviewed  COMPREHENSIVE METABOLIC PANEL - Abnormal; Notable for the following:       Result Value   BUN 21 (*)    Creatinine, Ser 1.92 (*)    Calcium 8.8 (*)    ALT 14 (*)    GFR calc non Af Amer 40 (*)    GFR calc Af Amer 46 (*)    All other components within normal limits  CBC - Abnormal;  Notable for the following:    Hemoglobin 12.7 (*)    HCT 36.9 (*)    All other components within normal limits  URINALYSIS, COMPLETE (UACMP) WITH MICROSCOPIC - Abnormal; Notable for the following:    Color, Urine STRAW (*)    APPearance CLEAR (*)    All other components within normal limits  LIPASE, BLOOD    Radiology: * "Ct Soft Tissue Neck W Contrast  Result Date: 03/28/2016 CLINICAL DATA:  LEFT neck abscess versus cyst 2 months. LEFT swollen neck. EXAM: CT NECK WITH CONTRAST TECHNIQUE: Multidetector CT imaging of the neck was performed using the standard protocol following the bolus administration of intravenous contrast. CONTRAST:  60mL ISOVUE-300 IOPAMIDOL (ISOVUE-300) INJECTION 61% COMPARISON:  None. FINDINGS: Pharynx and larynx: Normal. Salivary glands: 10 mm LEFT intra parotid lymph node. Otherwise unremarkable. Thyroid: Normal. Lymph nodes: No lymphadenopathy by CT size criteria. Subcentimeter bilateral supraclavicular lymph nodes. Vascular: Normal. Limited intracranial: Normal. Visualized orbits: Normal. Mastoids and visualized paranasal sinuses: Well-aerated. Skeleton: Broad reversed cervical lordosis. Upper thoracic levoscoliosis. Uncovertebral hypertrophy resulting in severe C6-7 neural foraminal narrowing. Poor dentition with multiple absent teeth. Upper chest: Lung apices are clear. No superior mediastinal lymphadenopathy. Mild calcific atherosclerosis of the aortic arch. Other: 11 x 8 mm rim enhancing fluid collection within the superficial LEFT lateral neck, superficial to the midsternal flattened mastoid muscle, inferior to the LEFT parotid tail. Small LEFT neck effusion without subcutaneous gas or radiopaque foreign bodies. Skin marker corresponds to focal LEFT posterior auricular skin thickening without fluid collection. IMPRESSION: 11 x 8 mm superficial LEFT lateral neck abscess and effusion. Focal thickening/ cellulitis LEFT posterior auricular soft tissues. Poor dentition.  Electronically Signed   By: Awilda Metro M.D.   On: 03/28/2016 06:33   Ct Renal Stone Study  Result Date: 04/23/2016 CLINICAL DATA:  Left greater than right flank pain 1 week. Difficulty emptying bladder. History of polycystic kidney disease. EXAM: CT ABDOMEN AND PELVIS WITHOUT CONTRAST TECHNIQUE: Multidetector CT imaging of the abdomen and pelvis was performed following the standard protocol without IV contrast. COMPARISON:  07/21/2015 FINDINGS: Lower chest: Lung bases are normal. Hepatobiliary: Gallbladder is contracted. There are numerous liver cysts unchanged. Biliary tree is normal. Pancreas: Within normal. Spleen: Within normal. Adrenals/Urinary Tract: Adrenal glands are normal. Enlarged multi cystic kidneys without significant change. Few small bilateral renal calcifications unchanged. No evidence of hydronephrosis. Ureters and bladder are within normal. Stomach/Bowel: Stomach and small bowel are within normal. Appendix is normal. There is diverticulosis of the colon. Mild fecal retention throughout the colon. Vascular/Lymphatic: Vascular structures are within normal. No significant adenopathy. Reproductive: Within normal. Other: No free fluid or focal inflammatory change.  Musculoskeletal: Mild degenerate change of the lower lumbar spine with disc disease at the L4-5 and L5-S1 levels. IMPRESSION: No acute findings in the abdomen/pelvis. 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 Multiple liver cysts unchanged. Colonic diverticulosis. Electronically Signed   By: Elberta Fortis M.D.   On: 04/23/2016 19:46  " * I personally reviewed the radiologic studies    ED Course:  The patient had a post. Residual 25 mL. Review of his laboratory work shows some persistent renal insufficiency though not remarkably changed from previous results. Patient was given some IV fluid bolus here.  He was also given Tylenol for pain. His polycystic kidney disease appears to be stable with no evidence per CAT scan evaluation of any new changes. Does not appear to have renal colic or obstructive pattern on that side. His urine shows no indications of infection or blood in the urine and he is able to verbally and clear his urine. Patient I felt could be treated on an outpatient basis though advised the patient on 2 separate occasions and necessity for follow-up with his primary physician and referral to urology and nephrology have some prostatic hypertrophy and the patient's concern about prostate cancer. There was no evidence per CAT scan evaluation though he was advised his workup is not complete as concerning his prostate.     Assessment:  Left flank discomfort Polycystic kidney disease Renal insufficiency Mild dehydration   Final Clinical Impression: *  Final diagnoses:  Left flank pain     Plan: Outpatient Patient was advised to return immediately if condition worsens. Patient was advised to follow up with their primary care physician or other specialized physicians involved in their outpatient care. The patient and/or family member/power of attorney had laboratory results reviewed at the bedside. All questions and concerns were addressed and appropriate discharge instructions were distributed by the nursing staff.             Jennye Moccasin, MD 04/23/16 2207

## 2016-04-23 NOTE — ED Notes (Signed)
Pt returned to room  

## 2016-04-23 NOTE — Discharge Instructions (Signed)
Please take Tylenol for pain and you can take 2 extra strength Tylenol around the clock every 8 hours as needed. Return to the emergency department for fever, inability to urinate, increasing abdominal pain, or any other new concerns. Please drink plenty of fluids. Avoid ibuprofen, Naprosyn, etc. as these can affect her kidney function  Please return immediately if condition worsens. Please contact her primary physician or the physician you were given for referral. If you have any specialist physicians involved in her treatment and plan please also contact them. Thank you for using [facility].

## 2016-04-23 NOTE — ED Triage Notes (Signed)
Pt has polycystic kidney disease and is having difficulty emptying his bladder - c/o left testicle pain and left flank pain - reports frequency and urgency with urination

## 2016-04-23 NOTE — ED Notes (Signed)
Post void residual of 26 ml per bladder scan. Dr Huel CoteQuigley informed.

## 2016-05-01 ENCOUNTER — Encounter (INDEPENDENT_AMBULATORY_CARE_PROVIDER_SITE_OTHER): Payer: Commercial Managed Care - PPO | Admitting: Vascular Surgery

## 2017-08-19 ENCOUNTER — Encounter: Payer: Self-pay | Admitting: Emergency Medicine

## 2017-08-19 ENCOUNTER — Other Ambulatory Visit: Payer: Self-pay

## 2017-08-19 ENCOUNTER — Emergency Department
Admission: EM | Admit: 2017-08-19 | Discharge: 2017-08-19 | Disposition: A | Discharge status: Home / Self Care | Payer: Commercial Managed Care - PPO | Attending: Emergency Medicine | Admitting: Emergency Medicine

## 2017-08-19 ENCOUNTER — Emergency Department: Payer: Commercial Managed Care - PPO

## 2017-08-19 DIAGNOSIS — Z79899 Other long term (current) drug therapy: Secondary | ICD-10-CM | POA: Diagnosis not present

## 2017-08-19 DIAGNOSIS — S6991XA Unspecified injury of right wrist, hand and finger(s), initial encounter: Secondary | ICD-10-CM | POA: Diagnosis present

## 2017-08-19 DIAGNOSIS — W230XXA Caught, crushed, jammed, or pinched between moving objects, initial encounter: Secondary | ICD-10-CM | POA: Diagnosis not present

## 2017-08-19 DIAGNOSIS — S59901A Unspecified injury of right elbow, initial encounter: Secondary | ICD-10-CM | POA: Insufficient documentation

## 2017-08-19 DIAGNOSIS — I10 Essential (primary) hypertension: Secondary | ICD-10-CM | POA: Insufficient documentation

## 2017-08-19 DIAGNOSIS — Y999 Unspecified external cause status: Secondary | ICD-10-CM | POA: Insufficient documentation

## 2017-08-19 DIAGNOSIS — Y9389 Activity, other specified: Secondary | ICD-10-CM | POA: Insufficient documentation

## 2017-08-19 DIAGNOSIS — S60221A Contusion of right hand, initial encounter: Secondary | ICD-10-CM | POA: Insufficient documentation

## 2017-08-19 DIAGNOSIS — M7711 Lateral epicondylitis, right elbow: Secondary | ICD-10-CM | POA: Diagnosis not present

## 2017-08-19 DIAGNOSIS — Y9289 Other specified places as the place of occurrence of the external cause: Secondary | ICD-10-CM | POA: Insufficient documentation

## 2017-08-19 MED ORDER — PREDNISONE 10 MG (21) PO TBPK: ORAL_TABLET | ORAL | 0 refills | Status: DC

## 2017-08-19 NOTE — ED Triage Notes (Signed)
Pt presents to ED with c/o abscess to R middle finger with swelling and pain.

## 2017-08-19 NOTE — Discharge Instructions (Addendum)
Follow-up with your regular doctor the acute care if not better in 5 to 7 days.  Use medication as prescribed.  Soak the 2 fingers and warm water with Epson salts at least 3 times a day for about 10 to 15 minutes.

## 2017-08-19 NOTE — ED Provider Notes (Signed)
[facility] Emergency Department Provider Note  ____________________________________________   First MD Initiated Contact with Patient 08/19/17 1422     (approximate)  I have reviewed the triage vital signs and the nursing notes.   HISTORY  Chief Complaint Abscess    HPI John Gregory is a 52 y.o. male resents emergency department complaining of right middle finger and fourth finger swelling and questionable abscess.  He states he hit his hand with a [employer].  Did not know if he broke his fingers at the time but now there is some swelling around the nail that needs to be drained.  He denies any fever or chills.  States his elbow also hurts.  He did have an injury and thinks he might have broken something.  Past Medical History:  Diagnosis Date  . Hypertension   . Kidney stone   . Polycystic kidney disease   . Renal disorder     There are no active problems to display for this patient.   Past Surgical History:  Procedure Laterality Date  . ANKLE SURGERY Left   . KNEE SURGERY Bilateral   . SHOULDER SURGERY Left   . WRIST SURGERY Left     Prior to Admission medications   Medication Sig Start Date End Date Taking? Authorizing Provider  amLODipine (NORVASC) 10 MG tablet  10/20/15   [provider]  benazepril-hydrochlorthiazide (LOTENSIN HCT) 20-12.5 MG tablet Take by mouth.    [provider]  predniSONE (STERAPRED UNI-PAK 21 TAB) 10 MG (21) TBPK tablet Take 6 pills on day one then decrease by 1 pill each day 08/19/17   Faythe Ghee, PA-C    Allergies Calcium-containing compounds and Penicillins  No family history on file.  Social History Social History   Tobacco Use  . Smoking status: Never Smoker  . Smokeless tobacco: Never Used  Substance Use Topics  . Alcohol use: No  . Drug use: No    Review of Systems  Constitutional: No fever/chills Eyes: No visual changes. ENT: No sore throat. Respiratory: Denies  cough Genitourinary: Negative for dysuria. Musculoskeletal: Negative for back pain.  Positive for right elbow and right hand pain Skin: Negative for rash.  Positive for swelling around the nailbeds on the right third and fourth fingers    ____________________________________________   PHYSICAL EXAM:  VITAL SIGNS: ED Triage Vitals  Enc Vitals Group     BP 08/19/17 1345 126/84     Pulse Rate 08/19/17 1345 63     Resp 08/19/17 1345 16     Temp 08/19/17 1345 98.2 F (36.8 C)     Temp Source 08/19/17 1345 Oral     SpO2 08/19/17 1345 100 %     Weight 08/19/17 1342 140 lb (63.5 kg)     Height 08/19/17 1342  (1.702 m)     Head Circumference --      Peak Flow --      Pain Score 08/19/17 1342 8     Pain Loc --      Pain Edu? --      Excl. in GC? --     Constitutional: Alert and oriented. Well appearing and in no acute distress. Eyes: Conjunctivae are normal.  Head: Atraumatic. Nose: No congestion/rhinnorhea. Mouth/Throat: Mucous membranes are moist.   Cardiovascular: Normal rate, regular rhythm. Respiratory: Normal respiratory effort.  No retractions GU: deferred Musculoskeletal: FROM all extremities, warm and well perfused.  The right elbow is tender to palpation.  The right  third and fourth fingers are tender to palpation.  There is some swelling around the cuticle area like paronychias. Neurologic:  Normal speech and language.  Skin:  Skin is warm, dry and intact. No rash noted.  Positive for swelling at the nailbeds Psychiatric: Mood and affect are normal. Speech and behavior are normal.  ____________________________________________   LABS (all labs ordered are listed, but only abnormal results are displayed)  Labs Reviewed - No data to display ____________________________________________   ____________________________________________  RADIOLOGY  X-ray of the right hand and right elbow are negative for any  fractures.  ____________________________________________   PROCEDURES  Procedure(s) performed: Needle aspiration of the fluid at the nail.  There is nothing but blood in this area  Procedures    ____________________________________________   INITIAL IMPRESSION / ASSESSMENT AND PLAN / ED COURSE  Pertinent labs & imaging results that were available during my care of the patient were reviewed by me and considered in my medical decision making (see chart for details).  Patient is a 52 year old male presents emergency department complaining of right hand and right elbow pain.  He is afraid he has an abscess on the right third and fourth fingers at the nailbeds.  He states he hit the hand with the [employer] has had swelling ever since.  He is also complaining of right elbow pain.  On physical exam patient has minimal swelling and tenderness on the right third and fourth fingers.  There is some swelling at the nailbeds.  The right elbow is tender to palpation.  X-ray of the right hand and right elbow are negative  Aspiration of the fluid at the base of the nail was attempted but this area is only hard with small amount of blood.  X-ray exam findings explained to patient.  He is to follow-up with his regular doctor if not better in 5 to 7 days.  He is to soak the fingers in warm water with Epson salts.  He is to apply ice to the elbow.  He was given a prescription for a Medrol Dosepak.  He is to use a over-the-counter tennis elbow brace.  He states he understands will comply with our instructions.  He was discharged in stable condition     As part of my medical decision making, I reviewed the following data within the electronic MEDICAL RECORD NUMBER Nursing notes reviewed and incorporated, Old chart reviewed, Radiograph reviewed x-ray of the hand and elbow are negative, Notes from prior ED visits and [place] Controlled Substance Database  ____________________________________________   FINAL  CLINICAL IMPRESSION(S) / ED DIAGNOSES  Final diagnoses:  Contusion of right hand, initial encounter  Lateral epicondylitis of right elbow      NEW MEDICATIONS STARTED DURING THIS VISIT:  Discharge Medication List as of 08/19/2017  3:58 PM    START taking these medications   Details  predniSONE (STERAPRED UNI-PAK 21 TAB) 10 MG (21) TBPK tablet Take 6 pills on day one then decrease by 1 pill each day, Print         Note:  This document was prepared using Dragon voice recognition software and may include unintentional dictation errors.    Faythe Ghee, PA-C 08/19/17 1658    Emily Filbert, MD 08/25/17 [phone]

## 2017-10-24 NOTE — Discharge Instructions (Signed)
General Anesthesia, Adult, Care After °These instructions provide you with information about caring for yourself after your procedure. Your health care provider may also give you more specific instructions. Your treatment has been planned according to current medical practices, but problems sometimes occur. Call your health care provider if you have any problems or questions after your procedure. °What can I expect after the procedure? °After the procedure, it is common to have: °· Vomiting. °· A sore throat. °· Mental slowness. ° °It is common to feel: °· Nauseous. °· Cold or shivery. °· Sleepy. °· Tired. °· Sore or achy, even in parts of your body where you did not have surgery. ° °Follow these instructions at home: °For at least 24 hours after the procedure: °· Do not: °? Participate in activities where you could fall or become injured. °? Drive. °? Use heavy machinery. °? Drink alcohol. °? Take sleeping pills or medicines that cause drowsiness. °? Make important decisions or sign legal documents. °? Take care of children on your own. °· Rest. °Eating and drinking °· If you vomit, drink water, juice, or soup when you can drink without vomiting. °· Drink enough fluid to keep your urine clear or pale yellow. °· Make sure you have little or no nausea before eating solid foods. °· Follow the diet recommended by your health care provider. °General instructions °· Have a responsible adult stay with you until you are awake and alert. °· Return to your normal activities as told by your health care provider. Ask your health care provider what activities are safe for you. °· Take over-the-counter and prescription medicines only as told by your health care provider. °· If you smoke, do not smoke without supervision. °· Keep all follow-up visits as told by your health care provider. This is important. °Contact a health care provider if: °· You continue to have nausea or vomiting at home, and medicines are not helpful. °· You  cannot drink fluids or start eating again. °· You cannot urinate after 8-12 hours. °· You develop a skin rash. °· You have fever. °· You have increasing redness at the site of your procedure. °Get help right away if: °· You have difficulty breathing. °· You have chest pain. °· You have unexpected bleeding. °· You feel that you are having a life-threatening or urgent problem. °This information is not intended to replace advice given to you by your health care provider. Make sure you discuss any questions you have with your health care provider. °Document Released: 06/19/2000 Document Revised: 08/16/2015 Document Reviewed: 02/25/2015 °Elsevier Interactive Patient Education © 2018 [employer] ° °

## 2017-10-29 ENCOUNTER — Encounter: Payer: Self-pay | Admitting: *Deleted

## 2017-10-29 ENCOUNTER — Other Ambulatory Visit: Payer: Self-pay

## 2017-10-31 ENCOUNTER — Ambulatory Visit: Payer: Commercial Managed Care - PPO | Admitting: Anesthesiology

## 2017-10-31 ENCOUNTER — Encounter
Admission: RE | Disposition: A | Discharge status: Home / Self Care | Payer: Self-pay | Source: Ambulatory Visit | Attending: Otolaryngology

## 2017-10-31 ENCOUNTER — Ambulatory Visit
Admission: RE | Admit: 2017-10-31 | Discharge: 2017-10-31 | Disposition: A | Discharge status: Home / Self Care | Payer: Commercial Managed Care - PPO | Source: Ambulatory Visit | Attending: Otolaryngology | Admitting: Otolaryngology

## 2017-10-31 DIAGNOSIS — Z79899 Other long term (current) drug therapy: Secondary | ICD-10-CM | POA: Diagnosis not present

## 2017-10-31 DIAGNOSIS — R221 Localized swelling, mass and lump, neck: Secondary | ICD-10-CM | POA: Diagnosis present

## 2017-10-31 DIAGNOSIS — I10 Essential (primary) hypertension: Secondary | ICD-10-CM | POA: Insufficient documentation

## 2017-10-31 DIAGNOSIS — L720 Epidermal cyst: Secondary | ICD-10-CM | POA: Insufficient documentation

## 2017-10-31 HISTORY — DX: Unspecified osteoarthritis, unspecified site: M19.90

## 2017-10-31 HISTORY — DX: Presence of dental prosthetic device (complete) (partial): Z97.2

## 2017-10-31 HISTORY — PX: EXCISION MASS NECK: SHX6703

## 2017-10-31 HISTORY — DX: Presence of spectacles and contact lenses: Z97.3

## 2017-10-31 SURGERY — EXCISION, MASS, NECK
Anesthesia: General | Laterality: Left

## 2017-10-31 MED ORDER — EPHEDRINE SULFATE 50 MG/ML IJ SOLN
INTRAMUSCULAR | Status: DC | PRN
  Administered 2017-10-31: 10 mg via INTRAVENOUS
  Administered 2017-10-31: 5 mg via INTRAVENOUS

## 2017-10-31 MED ORDER — FENTANYL CITRATE (PF) 100 MCG/2ML IJ SOLN
INTRAMUSCULAR | Status: DC | PRN
  Administered 2017-10-31: 50 ug via INTRAVENOUS

## 2017-10-31 MED ORDER — LIDOCAINE-EPINEPHRINE 1 %-1:100000 IJ SOLN
INTRAMUSCULAR | Status: DC | PRN
  Administered 2017-10-31: .5 mL

## 2017-10-31 MED ORDER — PROPOFOL 10 MG/ML IV BOLUS
INTRAVENOUS | Status: DC | PRN
  Administered 2017-10-31: 50 mg via INTRAVENOUS

## 2017-10-31 MED ORDER — BACITRACIN-NEOMYCIN-POLYMYXIN OINTMENT TUBE
TOPICAL_OINTMENT | CUTANEOUS | Status: DC | PRN
  Administered 2017-10-31: 1 via TOPICAL

## 2017-10-31 MED ORDER — ONDANSETRON HCL 4 MG/2ML IJ SOLN
INTRAMUSCULAR | Status: DC | PRN
  Administered 2017-10-31: 4 mg via INTRAVENOUS

## 2017-10-31 MED ORDER — DOXYCYCLINE MONOHYDRATE 100 MG PO CAPS: 100.0000 mg | ORAL_CAPSULE | Freq: Two times a day (BID) | ORAL | 0 refills | Status: DC

## 2017-10-31 MED ORDER — SODIUM CHLORIDE 0.9 % IV SOLN: INTRAVENOUS | Status: DC

## 2017-10-31 MED ORDER — SODIUM CHLORIDE 0.9 % IV SOLN
INTRAVENOUS | Status: DC | PRN
  Administered 2017-10-31: 10:00:00 via INTRAVENOUS

## 2017-10-31 MED ORDER — BACITRACIN 500 UNIT/GM EX OINT: 1.0000 "application " | TOPICAL_OINTMENT | Freq: Two times a day (BID) | CUTANEOUS | 0 refills | Status: DC

## 2017-10-31 MED ORDER — OXYCODONE HCL 5 MG/5ML PO SOLN: 5.0000 mg | Freq: Once | ORAL | Status: DC | PRN

## 2017-10-31 MED ORDER — DEXMEDETOMIDINE HCL 200 MCG/2ML IV SOLN
INTRAVENOUS | Status: DC | PRN
  Administered 2017-10-31: 4 ug via INTRAVENOUS

## 2017-10-31 MED ORDER — PROPOFOL 500 MG/50ML IV EMUL
INTRAVENOUS | Status: DC | PRN
  Administered 2017-10-31: 100 ug/kg/min via INTRAVENOUS

## 2017-10-31 MED ORDER — PROMETHAZINE HCL 25 MG/ML IJ SOLN: 6.2500 mg | INTRAMUSCULAR | Status: DC | PRN

## 2017-10-31 MED ORDER — OXYCODONE HCL 5 MG PO TABS: 5.0000 mg | ORAL_TABLET | Freq: Once | ORAL | Status: DC | PRN

## 2017-10-31 MED ORDER — HYDROCODONE-ACETAMINOPHEN 5-325 MG PO TABS: 1.0000 | ORAL_TABLET | ORAL | 0 refills | Status: DC | PRN

## 2017-10-31 MED ORDER — ONDANSETRON HCL 4 MG PO TABS: 4.0000 mg | ORAL_TABLET | Freq: Three times a day (TID) | ORAL | 0 refills | Status: DC | PRN

## 2017-10-31 MED ORDER — MEPERIDINE HCL 25 MG/ML IJ SOLN: 6.2500 mg | INTRAMUSCULAR | Status: DC | PRN

## 2017-10-31 MED ORDER — LIDOCAINE HCL (CARDIAC) PF 100 MG/5ML IV SOSY
PREFILLED_SYRINGE | INTRAVENOUS | Status: DC | PRN
  Administered 2017-10-31: 30 mg via INTRAVENOUS

## 2017-10-31 MED ORDER — FENTANYL CITRATE (PF) 100 MCG/2ML IJ SOLN: 25.0000 ug | INTRAMUSCULAR | Status: DC | PRN

## 2017-10-31 SURGICAL SUPPLY — 34 items
BLADE SURG 15 STRL LF DISP TIS (BLADE) ×1 IMPLANT
BLADE SURG 15 STRL SS (BLADE) ×2
CANISTER SUCT 1200ML W/VALVE (MISCELLANEOUS) ×3 IMPLANT
CORD BIP STRL DISP 12FT (MISCELLANEOUS) ×3 IMPLANT
DERMABOND ADVANCED (GAUZE/BANDAGES/DRESSINGS) ×2
DERMABOND ADVANCED .7 DNX12 (GAUZE/BANDAGES/DRESSINGS) ×1 IMPLANT
DRSG TEGADERM 2-3/8X2-3/4 SM (GAUZE/BANDAGES/DRESSINGS) ×3 IMPLANT
ELECT CAUTERY BLADE TIP 2.5 (TIP) ×3
ELECT REM PT RETURN 9FT ADLT (ELECTROSURGICAL) ×3
ELECTRODE CAUTERY BLDE TIP 2.5 (TIP) ×1 IMPLANT
ELECTRODE REM PT RTRN 9FT ADLT (ELECTROSURGICAL) ×1 IMPLANT
GAUZE SPONGE 4X4 12PLY STRL (GAUZE/BANDAGES/DRESSINGS) ×3 IMPLANT
GLOVE BIO SURGEON STRL SZ7.5 (GLOVE) ×3 IMPLANT
GOWN STRL REUS W/ TWL LRG LVL3 (GOWN DISPOSABLE) ×2 IMPLANT
GOWN STRL REUS W/TWL LRG LVL3 (GOWN DISPOSABLE) ×4
KIT TURNOVER KIT A (KITS) ×3 IMPLANT
MARKER SKIN XFINE TIP W/RULER (MISCELLANEOUS) ×3 IMPLANT
NEEDLE HYPO 25GX1X1/2 BEV (NEEDLE) ×3 IMPLANT
NS IRRIG 500ML POUR BTL (IV SOLUTION) ×3 IMPLANT
PACK HEAD/NECK (MISCELLANEOUS) ×3 IMPLANT
SPONGE KITTNER 5P (MISCELLANEOUS) ×3 IMPLANT
STAPLER SKIN PROX 35W (STAPLE) ×3 IMPLANT
SUCTION FRAZIER TIP 10 FR DISP (SUCTIONS) ×3 IMPLANT
SUT ETHILON 6 0 9-3 1X18 BLK (SUTURE) ×3 IMPLANT
SUT SILK 2 0 (SUTURE) ×4
SUT SILK 2-0 18XBRD TIE 12 (SUTURE) ×2 IMPLANT
SUT SILK 3 0 (SUTURE) ×2
SUT SILK 3-0 18XBRD TIE 12 (SUTURE) ×1 IMPLANT
SUT VIC AB 4-0 FS2 27 (SUTURE) ×3 IMPLANT
SUT VIC AB 4-0 RB1 18 (SUTURE) ×6 IMPLANT
SUT VIC AB 4-0 RB1 27 (SUTURE) ×4
SUT VIC AB 4-0 RB1 27X BRD (SUTURE) ×2 IMPLANT
SYR 10ML LL (SYRINGE) ×3 IMPLANT
SYR BULB IRRIG 60ML STRL (SYRINGE) ×3 IMPLANT

## 2017-10-31 NOTE — Anesthesia Procedure Notes (Signed)
Date/Time: 10/31/2017 9:59 AM Performed by: Maree KrabbeWarren, Mahdi Frye, CRNA Pre-anesthesia Checklist: Patient identified, Emergency Drugs available, Suction available, Timeout performed and Patient being monitored Patient Re-evaluated:Patient Re-evaluated prior to induction Oxygen Delivery Method: Nasal cannula Placement Confirmation: positive ETCO2

## 2017-10-31 NOTE — Op Note (Signed)
..  10/31/2017  10:23 AM    Roanna RaiderSummers, Della  [id number]   Pre-Op Dx:  NECK MASS  Post-op Dx: NECK MASS  Proc: Excision of left neck mass 3cm  Surg: Orville Mena  Anes:  IV  EBL:  None  Comp:  None  Findings:  Cutaneous pit and underlying mass with significant surrounding scar tissue on left neck.  Procedure: With the patient in a comfortable supine position, IV anesthesia was administered.  The patient's left neck was marked in an existing skin crease overlying the left neck mass.  Care was taken to ensure pit and previous I&D site was part of excised cutaneous tissue.  The patient's left neck was prepped and draped in a standard fashion.  The neck was anesthetized with 0.455ml of 1% lidocaine with 1:100,000 epinephrine.  A 15 blade scalpel was used to make a fusiform incision overlying the left neck mass.  Cutaneous and subcutaneous tissues were divided with scissors and 15 blade scalpel.  The mass was circumferentially dissected and removed from the underlying fascia and scar tissue.  The wound was inspected and hemostasis was continued.    At this time, the wound was closed in a multilayer fashion using 5.0 vicryl for subcutaneous closure and 6.- Nylon in a running fashion for skin closure.  This was topped this bacitracin ointment.  Following this  The patient was returned to anesthesia, awakened, and transferred to recovery in stable condition.  Dispo:  PACU to home  Plan: Bacitrain and antibiotics.Hennie Duos.  Recheck my office in 1 week.   Renn Dirocco 10:23 AM 10/31/2017

## 2017-10-31 NOTE — Anesthesia Postprocedure Evaluation (Signed)
Anesthesia Post Note  Patient: Izora GalaJames Boyd Bess  Procedure(s) Performed: EXCISION MASS NECK (Left )  Patient location during evaluation: PACU Anesthesia Type: General Level of consciousness: awake and alert Pain management: pain level controlled Vital Signs Assessment: post-procedure vital signs reviewed and stable Respiratory status: spontaneous breathing, nonlabored ventilation, respiratory function stable and patient connected to nasal cannula oxygen Cardiovascular status: blood pressure returned to baseline and stable Postop Assessment: no apparent nausea or vomiting Anesthetic complications: no    SCOURAS, NICOLE ELAINE

## 2017-10-31 NOTE — Transfer of Care (Signed)
Immediate Anesthesia Transfer of Care Note  Patient: John Gregory  Procedure(s) Performed: EXCISION MASS NECK (Left )  Patient Location: PACU  Anesthesia Type: General  Level of Consciousness: awake, alert  and patient cooperative  Airway and Oxygen Therapy: Patient Spontanous Breathing and Patient connected to supplemental oxygen  Post-op Assessment: Post-op Vital signs reviewed, Patient's Cardiovascular Status Stable, Respiratory Function Stable, Patent Airway and No signs of Nausea or vomiting  Post-op Vital Signs: Reviewed and stable  Complications: No apparent anesthesia complications

## 2017-10-31 NOTE — H&P (Signed)
..  History and Physical paper copy reviewed and updated date of procedure and will be scanned into system.  Patient seen and examined and marked.  

## 2017-10-31 NOTE — Anesthesia Preprocedure Evaluation (Signed)
Anesthesia Evaluation  Patient identified by MRN, date of birth, ID band Patient awake    Reviewed: Allergy & Precautions, H&P , NPO status , Patient's Chart, lab work & pertinent test results, reviewed documented beta blocker date and time   Airway Mallampati: II  TM Distance: >3 FB Neck ROM: full    Dental no notable dental hx.    Pulmonary neg pulmonary ROS,    Pulmonary exam normal breath sounds clear to auscultation       Cardiovascular Exercise Tolerance: Good hypertension, negative cardio ROS   Rhythm:regular Rate:Normal     Neuro/Psych negative neurological ROS  negative psych ROS   GI/Hepatic negative GI ROS, Neg liver ROS,   Endo/Other  negative endocrine ROS  Renal/GU negative Renal ROS  negative genitourinary   Musculoskeletal   Abdominal   Peds  Hematology negative hematology ROS (+)   Anesthesia Other Findings   Reproductive/Obstetrics negative OB ROS                             Anesthesia Physical Anesthesia Plan  ASA: II  Anesthesia Plan: General   Post-op Pain Management:    Induction:   PONV Risk Score and Plan:   Airway Management Planned:   Additional Equipment:   Intra-op Plan:   Post-operative Plan:   Informed Consent: I have reviewed the patients History and Physical, chart, labs and discussed the procedure including the risks, benefits and alternatives for the proposed anesthesia with the patient or authorized representative who has indicated his/her understanding and acceptance.     Dental Advisory Given  Plan Discussed with: CRNA  Anesthesia Plan Comments:         Anesthesia Quick Evaluation  

## 2017-11-02 LAB — SURGICAL PATHOLOGY

## 2017-11-28 ENCOUNTER — Encounter: Payer: Self-pay | Admitting: Emergency Medicine

## 2017-11-28 ENCOUNTER — Other Ambulatory Visit: Payer: Self-pay

## 2017-11-28 ENCOUNTER — Emergency Department
Admission: EM | Admit: 2017-11-28 | Discharge: 2017-11-28 | Disposition: A | Discharge status: Home / Self Care | Payer: Commercial Managed Care - PPO | Attending: Emergency Medicine | Admitting: Emergency Medicine

## 2017-11-28 ENCOUNTER — Emergency Department: Payer: Commercial Managed Care - PPO

## 2017-11-28 DIAGNOSIS — R109 Unspecified abdominal pain: Secondary | ICD-10-CM | POA: Diagnosis present

## 2017-11-28 DIAGNOSIS — Z79899 Other long term (current) drug therapy: Secondary | ICD-10-CM | POA: Insufficient documentation

## 2017-11-28 DIAGNOSIS — F17228 Nicotine dependence, chewing tobacco, with other nicotine-induced disorders: Secondary | ICD-10-CM | POA: Insufficient documentation

## 2017-11-28 DIAGNOSIS — I10 Essential (primary) hypertension: Secondary | ICD-10-CM | POA: Insufficient documentation

## 2017-11-28 HISTORY — DX: Calculus of kidney: N20.0

## 2017-11-28 LAB — URINALYSIS, COMPLETE (UACMP) WITH MICROSCOPIC
Bacteria, UA: NONE SEEN
Bilirubin Urine: NEGATIVE
Glucose, UA: NEGATIVE mg/dL
Hgb urine dipstick: NEGATIVE
Ketones, ur: NEGATIVE mg/dL
Leukocytes, UA: NEGATIVE
Nitrite: NEGATIVE
PH: 7 (ref 5.0–8.0)
Protein, ur: NEGATIVE mg/dL
SQUAMOUS EPITHELIAL / LPF: NONE SEEN (ref 0–5)
Specific Gravity, Urine: 1.004 — ABNORMAL LOW (ref 1.005–1.030)
WBC, UA: NONE SEEN WBC/hpf (ref 0–5)

## 2017-11-28 LAB — CBC
HCT: 33.8 % — ABNORMAL LOW (ref 40.0–52.0)
HEMOGLOBIN: 11.7 g/dL — AB (ref 13.0–18.0)
MCH: 28.2 pg (ref 26.0–34.0)
MCHC: 34.5 g/dL (ref 32.0–36.0)
MCV: 81.8 fL (ref 80.0–100.0)
Platelets: 222 10*3/uL (ref 150–440)
RBC: 4.14 MIL/uL — ABNORMAL LOW (ref 4.40–5.90)
RDW: 13.2 % (ref 11.5–14.5)
WBC: 4 10*3/uL (ref 3.8–10.6)

## 2017-11-28 LAB — BASIC METABOLIC PANEL
Anion gap: 8 (ref 5–15)
BUN: 17 mg/dL (ref 6–20)
CHLORIDE: 104 mmol/L (ref 98–111)
CO2: 24 mmol/L (ref 22–32)
CREATININE: 1.86 mg/dL — AB (ref 0.61–1.24)
Calcium: 8.8 mg/dL — ABNORMAL LOW (ref 8.9–10.3)
GFR calc non Af Amer: 41 mL/min — ABNORMAL LOW (ref >60)
GFR, EST AFRICAN AMERICAN: 47 mL/min — AB (ref >60)
Glucose, Bld: 92 mg/dL (ref 70–99)
POTASSIUM: 3.3 mmol/L — AB (ref 3.5–5.1)
Sodium: 136 mmol/L (ref 135–145)

## 2017-11-28 MED ORDER — FENTANYL CITRATE (PF) 100 MCG/2ML IJ SOLN
50.0000 ug | INTRAMUSCULAR | Status: DC | PRN
  Administered 2017-11-28: 50 ug via INTRAVENOUS
  Filled 2017-11-28: qty 2

## 2017-11-28 MED ORDER — ONDANSETRON HCL 4 MG/2ML IJ SOLN
4.0000 mg | Freq: Once | INTRAMUSCULAR | Status: AC
  Administered 2017-11-28: 4 mg via INTRAVENOUS
  Filled 2017-11-28: qty 2

## 2017-11-28 MED ORDER — MORPHINE SULFATE (PF) 4 MG/ML IV SOLN
4.0000 mg | Freq: Once | INTRAVENOUS | Status: AC
  Administered 2017-11-28: 4 mg via INTRAVENOUS
  Filled 2017-11-28: qty 1

## 2017-11-28 MED ORDER — OXYCODONE HCL 5 MG PO TABS: 5.0000 mg | ORAL_TABLET | Freq: Three times a day (TID) | ORAL | 0 refills | Status: AC | PRN

## 2017-11-28 NOTE — ED Notes (Signed)
Pt taken to CT.

## 2017-11-28 NOTE — ED Triage Notes (Signed)
Pt arrived via POV with reports of left flank pain that started today while at work. Pt has hx of kidney stones and states the pain is similar to previous stone pain.

## 2017-11-28 NOTE — ED Provider Notes (Signed)
[facility] Emergency Department Provider Note  Time seen: 2:27 PM  I have reviewed the triage vital signs and the nursing notes.   HISTORY  Chief Complaint Flank Pain (left)    HPI John Gregory is a 52 y.o. male with a past medical history of hypertension, kidney stones, presents to the emergency department with left flank pain.  According to the patient he was pushing a pallet when he developed sharp moderate left flank pain.  States the pain is much worse with movement on the left side.  He states it feels identical to prior kidney stones he has had.  States he has had multiple in the past but is never required a procedure for removal, states he is passed them all naturally.  Denies any hematuria or dysuria today, but states he thought his urine looked pink 2 days ago.  States some nausea but denies vomiting.  Denies fever.    Past Medical History:  Diagnosis Date  . Arthritis    hands, shoulders  . Hypertension   . Kidney stone   . Kidney stones   . Polycystic kidney disease   . Renal disorder   . Wears contact lenses   . Wears dentures    partial upper and lower    There are no active problems to display for this patient.   Past Surgical History:  Procedure Laterality Date  . ANKLE SURGERY Left   . EXCISION MASS NECK Left 10/31/2017   Procedure: EXCISION MASS NECK;  Surgeon: Bud Face, MD;  Location: [facility];  Service: ENT;  Laterality: Left;  . KNEE SURGERY Bilateral   . SHOULDER SURGERY Left   . WRIST SURGERY Left     Prior to Admission medications   Medication Sig Start Date End Date Taking? Authorizing Provider  amLODipine (NORVASC) 10 MG tablet  10/20/15   [provider]  bacitracin 500 UNIT/GM ointment Apply 1 application topically 2 (two) times daily. 10/31/17   Vaught, Roney Mans, MD  benazepril-hydrochlorthiazide (LOTENSIN HCT) 20-12.5 MG tablet Take 2 tablets by mouth.     [provider]   doxycycline (MONODOX) 100 MG capsule Take 1 capsule (100 mg total) by mouth 2 (two) times daily. 10/31/17   Vaught, Roney Mans, MD  escitalopram (LEXAPRO) 10 MG tablet Take 10 mg by mouth daily.    [provider]  glycopyrrolate (ROBINUL) 1 MG tablet Take 1 mg by mouth 2 (two) times daily as needed.    [provider]  HYDROcodone-acetaminophen (NORCO) 5-325 MG tablet Take 1 tablet by mouth every 4 (four) hours as needed for moderate pain. 10/31/17   Vaught, Roney Mans, MD  ondansetron (ZOFRAN) 4 MG tablet Take 1 tablet (4 mg total) by mouth every 8 (eight) hours as needed for up to 10 doses for nausea or vomiting. 10/31/17   Bud Face, MD    Allergies  Allergen Reactions  . Bactrim [Sulfamethoxazole-Trimethoprim] Itching  . Calcium Other (See Comments)    Kidney stones  . Calcium-Containing Compounds     Causes kidney stones  . Penicillin G Swelling  . Penicillins Swelling    angioedema    History reviewed. No pertinent family history.  Social History Social History   Tobacco Use  . Smoking status: Never Smoker  . Smokeless tobacco: Current User    Types: Snuff  Substance Use Topics  . Alcohol use: No  . Drug use: No    Review of Systems Constitutional: Negative for fever. Cardiovascular: Negative for chest  pain. Respiratory: No shortness of breath. Gastrointestinal: Positive for left flank pain.  Positive for nausea negative for vomiting or diarrhea Genitourinary: Pink urine 2 days ago otherwise normal Musculoskeletal: Negative for musculoskeletal complaints Skin: Negative for skin complaints  Neurological: Negative for headache All other ROS negative  ____________________________________________   PHYSICAL EXAM:  VITAL SIGNS: ED Triage Vitals  Enc Vitals Group     BP 11/28/17 1227 (!) 128/96     Pulse Rate 11/28/17 1227 63     Resp 11/28/17 1227 20     Temp 11/28/17 1227 98.3 F (36.8 C)     Temp Source 11/28/17 1227 Oral     SpO2  11/28/17 1227 98 %     Weight 11/28/17 1227 138 lb (62.6 kg)     Height 11/28/17 1227 5\' 10"  (1.778 m)     Head Circumference --      Peak Flow --      Pain Score 11/28/17 1231 10     Pain Loc --      Pain Edu? --      Excl. in GC? --     Constitutional: Alert and oriented. Well appearing and in no distress. Eyes: Normal exam ENT   Head: Normocephalic and atraumatic.   Mouth/Throat: Mucous membranes are moist. Cardiovascular: Normal rate, regular rhythm. No murmur Respiratory: Normal respiratory effort without tachypnea nor retractions. Breath sounds are clear  Gastrointestinal: Soft, nontender abdomen, no rebound guarding or distention.  Mild left CVA tenderness. Musculoskeletal: Nontender with normal range of motion in all extremities.  Neurologic:  Normal speech and language. No gross focal neurologic deficits  Skin:  Skin is warm, dry and intact.  Psychiatric: Mood and affect are normal.   ____________________________________________   RADIOLOGY  CT scan shows one stone in the left renal calyx otherwise largely negative for acute abnormality.  ____________________________________________   INITIAL IMPRESSION / ASSESSMENT AND PLAN / ED COURSE  Pertinent labs & imaging results that were available during my care of the patient were reviewed by me and considered in my medical decision making (see chart for details).  Patient presents to the emergency department for left flank pain described as sharp onset earlier today while pushing a pallet.  Patient states this feels identical to past kidney stones mild CVA tenderness.  Differential would include ureterolithiasis, muscular skeletal pain, diverticulitis or colitis.  Reassuringly patient's labs are largely within normal limits including urinalysis.  Creatinine 1.86 appears to be largely baseline for the patient.  We will obtain a CT renal scan to further evaluate.  Patient agreeable to plan of care.  We will treat pain  and nausea while awaiting CT results.  CT scan shows a 2 x 3 mm stone in the left renal calyx otherwise largely nonrevealing for acute abnormality.  This kidney stone could be causing intermittent pain over this could also be more muscular skeletal pain.  I discussed with the patient follow-up with urology.  We will treat with a short course of pain medication.  I discussed my normal kidney stone return precautions for any worsening pain or fever. ____________________________________________   FINAL CLINICAL IMPRESSION(S) / ED DIAGNOSES  Left flank pain    Minna Antis, MD 11/28/17 1540

## 2017-11-29 LAB — URINE CULTURE

## 2017-12-02 ENCOUNTER — Emergency Department
Admission: EM | Admit: 2017-12-02 | Discharge: 2017-12-02 | Disposition: A | Discharge status: Home / Self Care | Payer: Commercial Managed Care - PPO | Attending: Emergency Medicine | Admitting: Emergency Medicine

## 2017-12-02 ENCOUNTER — Encounter: Payer: Self-pay | Admitting: Emergency Medicine

## 2017-12-02 ENCOUNTER — Other Ambulatory Visit: Payer: Self-pay

## 2017-12-02 DIAGNOSIS — Z87442 Personal history of urinary calculi: Secondary | ICD-10-CM | POA: Insufficient documentation

## 2017-12-02 DIAGNOSIS — R109 Unspecified abdominal pain: Secondary | ICD-10-CM | POA: Diagnosis present

## 2017-12-02 DIAGNOSIS — N189 Chronic kidney disease, unspecified: Secondary | ICD-10-CM | POA: Diagnosis not present

## 2017-12-02 DIAGNOSIS — F17220 Nicotine dependence, chewing tobacco, uncomplicated: Secondary | ICD-10-CM | POA: Diagnosis not present

## 2017-12-02 DIAGNOSIS — Z79899 Other long term (current) drug therapy: Secondary | ICD-10-CM | POA: Insufficient documentation

## 2017-12-02 DIAGNOSIS — I129 Hypertensive chronic kidney disease with stage 1 through stage 4 chronic kidney disease, or unspecified chronic kidney disease: Secondary | ICD-10-CM | POA: Diagnosis not present

## 2017-12-02 LAB — COMPREHENSIVE METABOLIC PANEL
ALBUMIN: 4.1 g/dL (ref 3.5–5.0)
ALT: 16 U/L (ref 0–44)
AST: 26 U/L (ref 15–41)
Alkaline Phosphatase: 53 U/L (ref 38–126)
Anion gap: 7 (ref 5–15)
BILIRUBIN TOTAL: 0.7 mg/dL (ref 0.3–1.2)
BUN: 13 mg/dL (ref 6–20)
CHLORIDE: 105 mmol/L (ref 98–111)
CO2: 26 mmol/L (ref 22–32)
CREATININE: 2.02 mg/dL — AB (ref 0.61–1.24)
Calcium: 8.7 mg/dL — ABNORMAL LOW (ref 8.9–10.3)
GFR calc Af Amer: 43 mL/min — ABNORMAL LOW (ref >60)
GFR, EST NON AFRICAN AMERICAN: 37 mL/min — AB (ref >60)
GLUCOSE: 90 mg/dL (ref 70–99)
POTASSIUM: 3.4 mmol/L — AB (ref 3.5–5.1)
Sodium: 138 mmol/L (ref 135–145)
Total Protein: 7.2 g/dL (ref 6.5–8.1)

## 2017-12-02 LAB — URINALYSIS, COMPLETE (UACMP) WITH MICROSCOPIC
BACTERIA UA: NONE SEEN
Bilirubin Urine: NEGATIVE
GLUCOSE, UA: NEGATIVE mg/dL
Hgb urine dipstick: NEGATIVE
Ketones, ur: NEGATIVE mg/dL
Leukocytes, UA: NEGATIVE
Nitrite: NEGATIVE
PH: 6 (ref 5.0–8.0)
PROTEIN: NEGATIVE mg/dL
Specific Gravity, Urine: 1.006 (ref 1.005–1.030)

## 2017-12-02 LAB — CBC
HEMATOCRIT: 37 % — AB (ref 40.0–52.0)
Hemoglobin: 12.6 g/dL — ABNORMAL LOW (ref 13.0–18.0)
MCH: 28.4 pg (ref 26.0–34.0)
MCHC: 34.1 g/dL (ref 32.0–36.0)
MCV: 83.4 fL (ref 80.0–100.0)
Platelets: 218 10*3/uL (ref 150–440)
RBC: 4.44 MIL/uL (ref 4.40–5.90)
RDW: 13.6 % (ref 11.5–14.5)
WBC: 3 10*3/uL — AB (ref 3.8–10.6)

## 2017-12-02 MED ORDER — ONDANSETRON 4 MG PO TBDP
4.0000 mg | ORAL_TABLET | Freq: Once | ORAL | Status: AC | PRN
  Administered 2017-12-02: 4 mg via ORAL
  Filled 2017-12-02: qty 1

## 2017-12-02 MED ORDER — TAMSULOSIN HCL 0.4 MG PO CAPS: 0.4000 mg | ORAL_CAPSULE | Freq: Every day | ORAL | 0 refills | Status: DC

## 2017-12-02 MED ORDER — OXYCODONE-ACETAMINOPHEN 5-325 MG PO TABS: 1.0000 | ORAL_TABLET | ORAL | 0 refills | Status: DC | PRN

## 2017-12-02 MED ORDER — ONDANSETRON 4 MG PO TBDP: 4.0000 mg | ORAL_TABLET | Freq: Three times a day (TID) | ORAL | 0 refills | Status: DC | PRN

## 2017-12-02 NOTE — ED Triage Notes (Signed)
Pt c/o left flank pain. States he was here Wednesday and dx with kidney stones. States we discharged him with pain meds but no nausea medication. Main complaint today is N/V.

## 2017-12-02 NOTE — ED Notes (Signed)
Pt here for left flank pain. Pt seen here on Tuesday or Wednesday for same and was diagnosed. Pt has been unable to pass. Pt has ha hx of kidney stones. Pt says the pain has dropped down but is not moving. Pt taking oxycodone at home.

## 2017-12-02 NOTE — ED Provider Notes (Signed)
[facility] Emergency Department Provider Note   ____________________________________________    I have reviewed the triage vital signs and the nursing notes.   HISTORY  Chief Complaint Flank Pain     HPI John Gregory is a 52 y.o. male who presents with complaints of left flank pain.  Patient reports he was seen here recently and thinks that he has a kidney stone.  He reports he has had many kidney stones this past and this feels exactly like prior episodes.  He is having nausea and states that he was not prescribed nausea medication.  Denies fevers or chills.  No dysuria.  He reports his pain is mainly under control.  He states that he can feel the kidney stone moving.  Review of records demonstrates the patient was seen had a CT scan which did demonstrate stone in his left kidney but no ureterolithiasis.  It is possible that his stone has distended and he is quite adamant that this is kidney stone pain  Past Medical History:  Diagnosis Date  . Arthritis    hands, shoulders  . Hypertension   . Kidney stone   . Kidney stones   . Polycystic kidney disease   . Renal disorder   . Wears contact lenses   . Wears dentures    partial upper and lower    There are no active problems to display for this patient.   Past Surgical History:  Procedure Laterality Date  . ANKLE SURGERY Left   . EXCISION MASS NECK Left 10/31/2017   Procedure: EXCISION MASS NECK;  Surgeon: Bud Face, MD;  Location: [facility];  Service: ENT;  Laterality: Left;  . KNEE SURGERY Bilateral   . SHOULDER SURGERY Left   . WRIST SURGERY Left     Prior to Admission medications   Medication Sig Start Date End Date Taking? Authorizing Provider  amLODipine (NORVASC) 10 MG tablet  10/20/15   [provider]  bacitracin 500 UNIT/GM ointment Apply 1 application topically 2 (two) times daily. 10/31/17   Vaught, Roney Mans, MD  benazepril-hydrochlorthiazide  (LOTENSIN HCT) 20-12.5 MG tablet Take 2 tablets by mouth.     [provider]  doxycycline (MONODOX) 100 MG capsule Take 1 capsule (100 mg total) by mouth 2 (two) times daily. 10/31/17   Vaught, Roney Mans, MD  escitalopram (LEXAPRO) 10 MG tablet Take 10 mg by mouth daily.    [provider]  glycopyrrolate (ROBINUL) 1 MG tablet Take 1 mg by mouth 2 (two) times daily as needed.    [provider]  HYDROcodone-acetaminophen (NORCO) 5-325 MG tablet Take 1 tablet by mouth every 4 (four) hours as needed for moderate pain. 10/31/17   Vaught, Roney Mans, MD  ondansetron (ZOFRAN ODT) 4 MG disintegrating tablet Take 1 tablet (4 mg total) by mouth every 8 (eight) hours as needed for nausea or vomiting. 12/02/17   Jene Every, MD  ondansetron (ZOFRAN) 4 MG tablet Take 1 tablet (4 mg total) by mouth every 8 (eight) hours as needed for up to 10 doses for nausea or vomiting. 10/31/17   Vaught, Roney Mans, MD  oxyCODONE (ROXICODONE) 5 MG immediate release tablet Take 1 tablet (5 mg total) by mouth every 8 (eight) hours as needed. 11/28/17 11/28/18  Minna Antis, MD  oxyCODONE-acetaminophen (PERCOCET) 5-325 MG tablet Take 1 tablet by mouth every 4 (four) hours as needed for severe pain. 12/02/17 12/02/18  Jene Every, MD  tamsulosin (FLOMAX) 0.4 MG CAPS capsule Take 1 capsule (  0.4 mg total) by mouth daily. 12/02/17   Jene Every, MD     Allergies Bactrim [sulfamethoxazole-trimethoprim]; Calcium; Calcium-containing compounds; Penicillin g; and Penicillins  No family history on file.  Social History Social History   Tobacco Use  . Smoking status: Never Smoker  . Smokeless tobacco: Current User    Types: Snuff  Substance Use Topics  . Alcohol use: No  . Drug use: No    Review of Systems  Constitutional: No fever/chills Eyes: No visual changes.  ENT: No sore throat. Cardiovascular: Denies chest pain. Respiratory: Denies shortness of breath. Gastrointestinal: As  above Genitourinary: As above Musculoskeletal: Negative for back pain. Skin: Negative for rash. Neurological: Negative for headaches   ____________________________________________   PHYSICAL EXAM:  VITAL SIGNS: ED Triage Vitals  Enc Vitals Group     BP 12/02/17 1514 (!) 141/88     Pulse Rate 12/02/17 1514 (!) 51     Resp 12/02/17 1514 16     Temp 12/02/17 1514 98.7 F (37.1 C)     Temp Source 12/02/17 1514 Oral     SpO2 12/02/17 1514 100 %     Weight 12/02/17 1515 65.8 kg (145 lb)     Height 12/02/17 1515 1.778 m (5\' 10" )     Head Circumference --      Peak Flow --      Pain Score 12/02/17 1514 8     Pain Loc --      Pain Edu? --      Excl. in GC? --     Constitutional: Alert and oriented. No acute distress. Pleasant and interactive  Nose: No congestion/rhinnorhea. Mouth/Throat: Mucous membranes are moist.    Cardiovascular: Normal rate, regular rhythm. Grossly normal heart sounds.  Good peripheral circulation. Respiratory: Normal respiratory effort.  No retractions.  Gastrointestinal: Soft and nontender. No distention.  No CVA tenderness.  Musculoskeletal: No lower extremity tenderness nor edema.  Warm and well perfused Neurologic:  Normal speech and language. No gross focal neurologic deficits are appreciated.  Skin:  Skin is warm, dry and intact. No rash noted. Psychiatric: Mood and affect are normal. Speech and behavior are normal.  ____________________________________________   LABS (all labs ordered are listed, but only abnormal results are displayed)  Labs Reviewed  URINALYSIS, COMPLETE (UACMP) WITH MICROSCOPIC - Abnormal; Notable for the following components:      Result Value   Color, Urine STRAW (*)    APPearance CLEAR (*)    All other components within normal limits  COMPREHENSIVE METABOLIC PANEL - Abnormal; Notable for the following components:   Potassium 3.4 (*)    Creatinine, Ser 2.02 (*)    Calcium 8.7 (*)    GFR calc non Af Amer 37 (*)     GFR calc Af Amer 43 (*)    All other components within normal limits  CBC - Abnormal; Notable for the following components:   WBC 3.0 (*)    Hemoglobin 12.6 (*)    HCT 37.0 (*)    All other components within normal limits   ____________________________________________  EKG   ____________________________________________  RADIOLOGY  Reviewed CT scan results from several days ago ____________________________________________   PROCEDURES  Procedure(s) performed: No  Procedures   Critical Care performed: No ____________________________________________   INITIAL IMPRESSION / ASSESSMENT AND PLAN / ED COURSE  Pertinent labs & imaging results that were available during my care of the patient were reviewed by me and considered in my medical decision making (see chart for details).  Patient well-appearing in no acute distress.  Exam is overall reassuring.  Lab work is significant for an elevated creatinine however this is chronic for him, urinalysis does not demonstrate hematuria.  Patient is quite adamant that this is kidney stone pain and he is asking for nausea medication which is certainly reasonable.  I have asked him to follow-up with his PCP and urology    ____________________________________________   FINAL CLINICAL IMPRESSION(S) / ED DIAGNOSES  Final diagnoses:  Flank pain        Note:  This document was prepared using Dragon voice recognition software and may include unintentional dictation errors.    Jene Every, MD 12/02/17 1655

## 2018-01-10 IMAGING — CT CT NECK W/ CM
3 of 5 series · 12 of 35 positions shown, 14 images · IV contrast (iopamidol)
Comparison: None.

CLINICAL DATA: LEFT neck abscess versus cyst 2 months. LEFT swollen
neck.

EXAM:
CT NECK WITH CONTRAST
TECHNIQUE: Multidetector CT imaging of the neck was performed using the
standard protocol following the bolus administration of intravenous
contrast.
CONTRAST:  60mL I4DXTS-IGG IOPAMIDOL (I4DXTS-IGG) INJECTION 61%

[Series 6: sag neck · sagittal · 0.42mm/px · 5 of 80 slices shown, 6 images]
[im 27/80  bone]
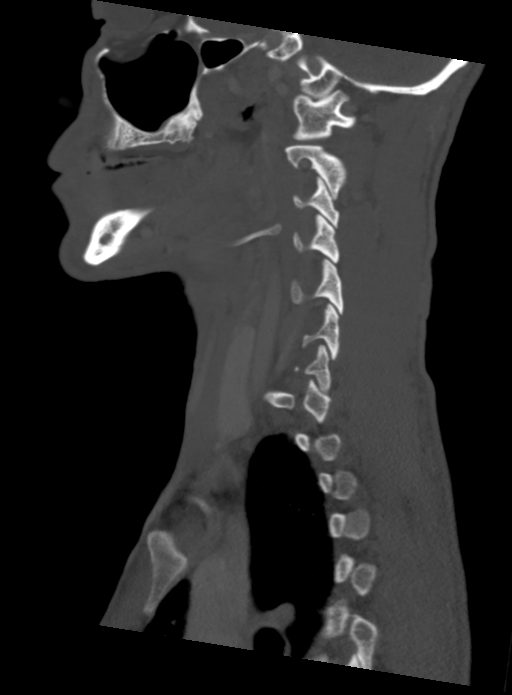
[im 33/80  bone]
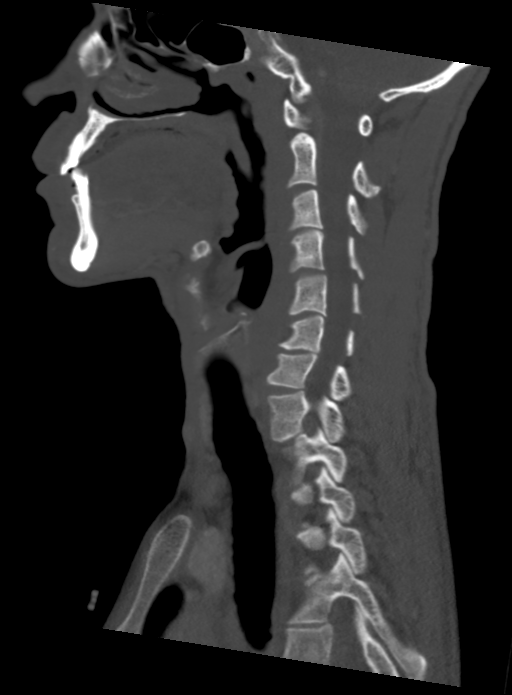
[im 40/80  soft-tissue]
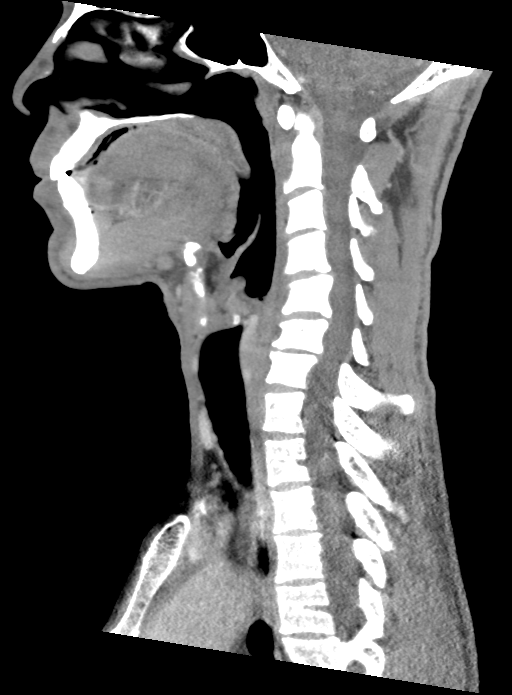
[im 40/80  bone]
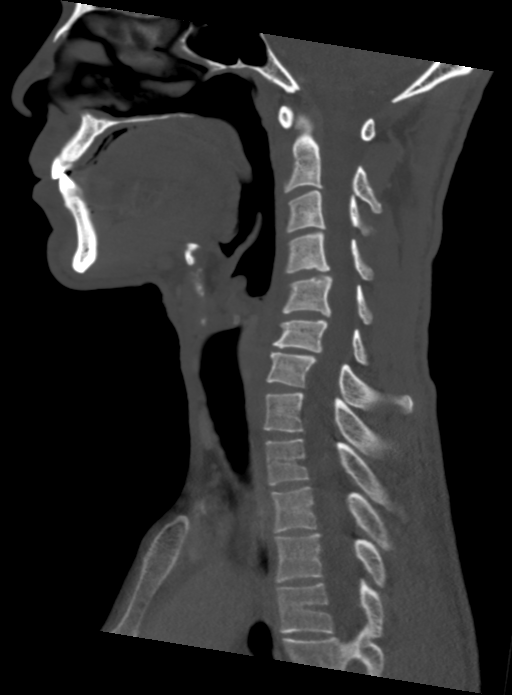
[im 47/80  bone]
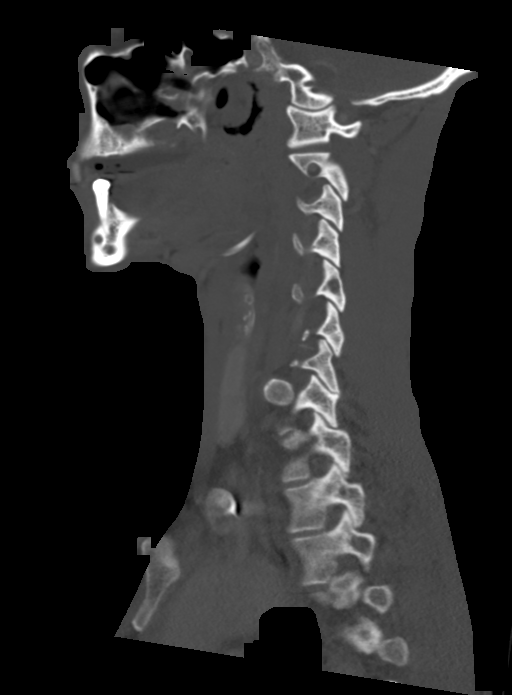
[im 53/80  bone]
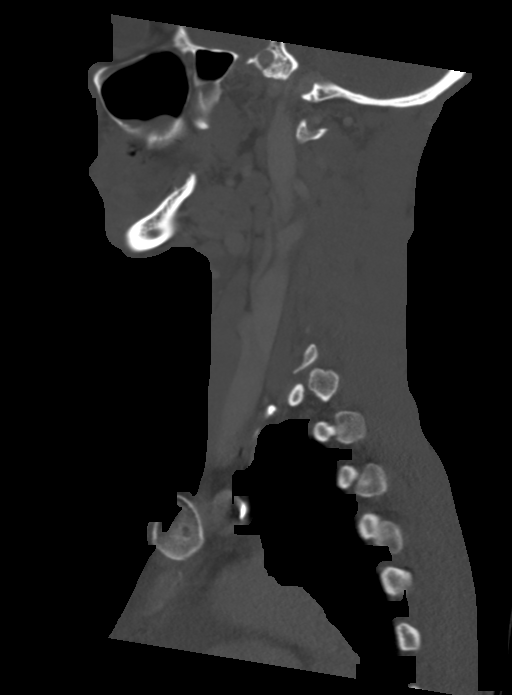

[Series 7: cor neck · coronal · 0.36mm/px · 3 of 89 slices shown]
[im 18/89  bone]
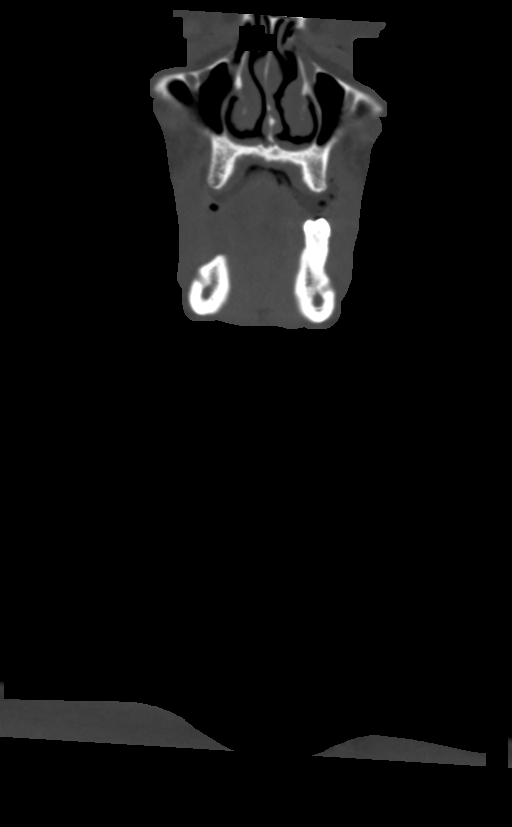
[im 36/89  bone]
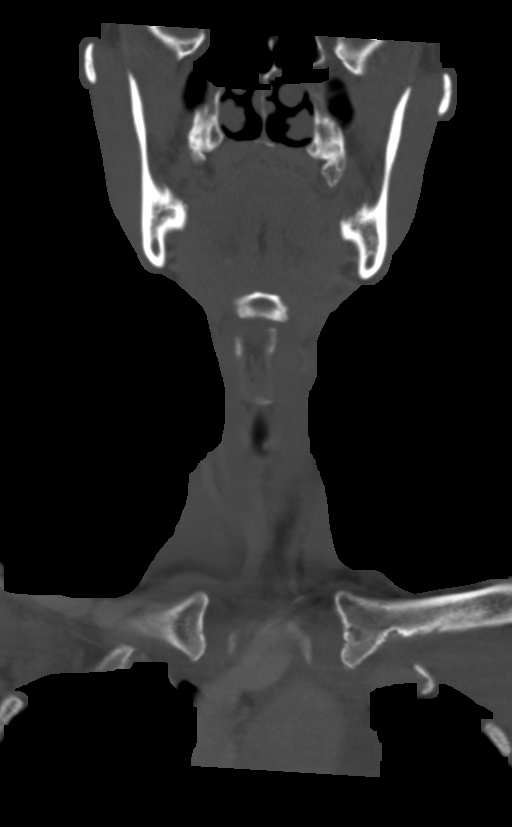
[im 53/89  bone]
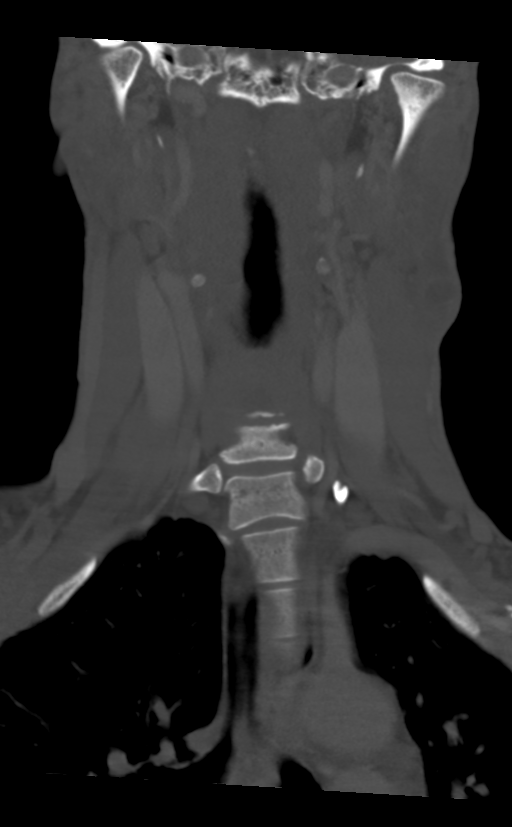

[Series 8: orthogonal ax · axial · 0.38mm/px · z∈[-299,-157]mm · 4 of 129 slices shown, 5 images]
[im 26/129  soft-tissue]
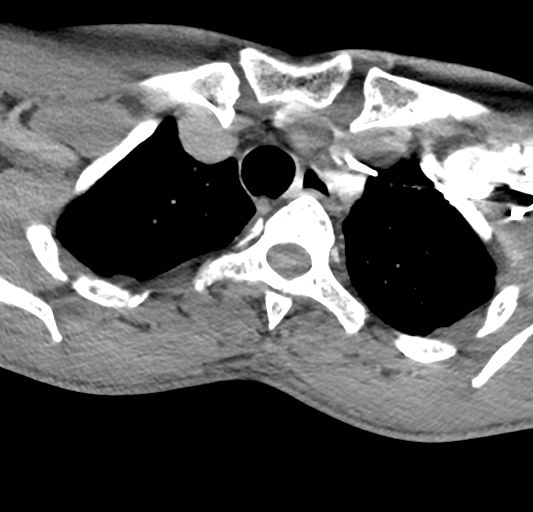
[im 26/129  bone]
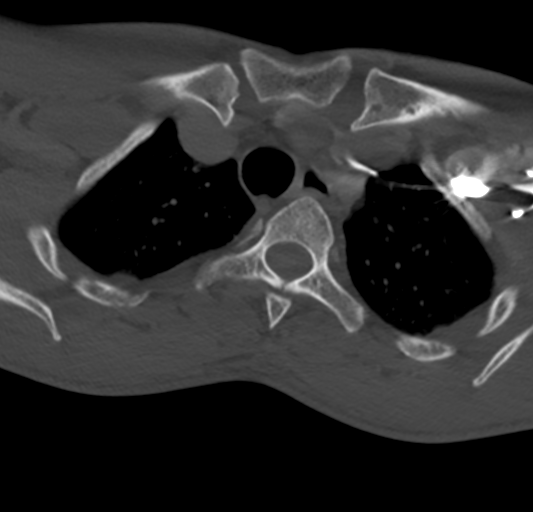
[im 52/129  bone]
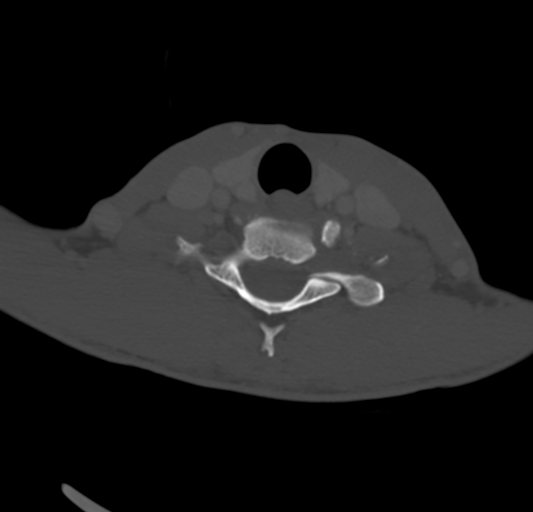
[im 77/129  bone]
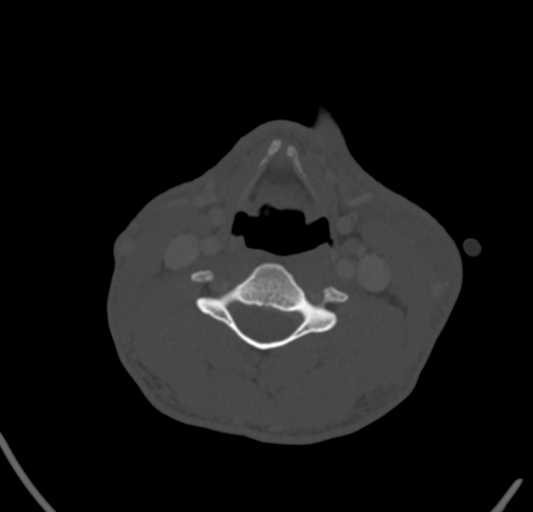
[im 103/129  bone]
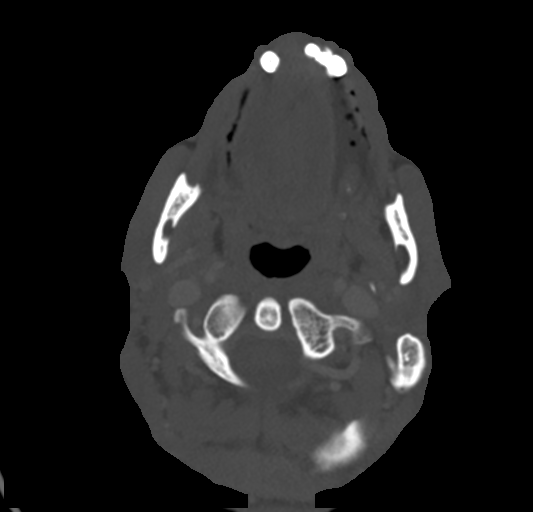

[12 of 35 positions shown; findings below may reference images not displayed]

FINDINGS: Pharynx and larynx: Normal.

Salivary glands: 10 mm LEFT intra parotid lymph node. Otherwise
unremarkable.

Thyroid: Normal.

Lymph nodes: No lymphadenopathy by CT size criteria. Subcentimeter
bilateral supraclavicular lymph nodes.

Vascular: Normal.

Limited intracranial: Normal.

Visualized orbits: Normal.

Mastoids and visualized paranasal sinuses: Well-aerated.

Skeleton: Broad reversed cervical lordosis. Upper thoracic
levoscoliosis. Uncovertebral hypertrophy resulting in severe C6-7
neural foraminal narrowing. Poor dentition with multiple absent
teeth.

Upper chest: Lung apices are clear. No superior mediastinal
lymphadenopathy. Mild calcific atherosclerosis of the aortic arch.

Other: 11 x 8 mm rim enhancing fluid collection within the
superficial LEFT lateral neck, superficial to the midsternal
flattened mastoid muscle, inferior to the LEFT parotid tail. Small
LEFT neck effusion without subcutaneous gas or radiopaque foreign
bodies. Skin marker corresponds to focal LEFT posterior auricular
skin thickening without fluid collection.
IMPRESSION: 11 x 8 mm superficial LEFT lateral neck abscess and effusion. Focal
thickening/ cellulitis LEFT posterior auricular soft tissues.

Poor dentition.

## 2018-02-18 ENCOUNTER — Emergency Department: Payer: Commercial Managed Care - PPO

## 2018-02-18 ENCOUNTER — Encounter: Payer: Self-pay | Admitting: Emergency Medicine

## 2018-02-18 ENCOUNTER — Emergency Department
Admission: EM | Admit: 2018-02-18 | Discharge: 2018-02-18 | Disposition: A | Discharge status: Home / Self Care | Payer: Commercial Managed Care - PPO | Attending: Emergency Medicine | Admitting: Emergency Medicine

## 2018-02-18 DIAGNOSIS — Z7982 Long term (current) use of aspirin: Secondary | ICD-10-CM | POA: Insufficient documentation

## 2018-02-18 DIAGNOSIS — Y92410 Unspecified street and highway as the place of occurrence of the external cause: Secondary | ICD-10-CM | POA: Diagnosis not present

## 2018-02-18 DIAGNOSIS — Z79899 Other long term (current) drug therapy: Secondary | ICD-10-CM | POA: Insufficient documentation

## 2018-02-18 DIAGNOSIS — R079 Chest pain, unspecified: Secondary | ICD-10-CM | POA: Diagnosis present

## 2018-02-18 DIAGNOSIS — I10 Essential (primary) hypertension: Secondary | ICD-10-CM | POA: Insufficient documentation

## 2018-02-18 DIAGNOSIS — Y939 Activity, unspecified: Secondary | ICD-10-CM | POA: Insufficient documentation

## 2018-02-18 DIAGNOSIS — Y998 Other external cause status: Secondary | ICD-10-CM | POA: Insufficient documentation

## 2018-02-18 LAB — BASIC METABOLIC PANEL
ANION GAP: 7 (ref 5–15)
BUN: 18 mg/dL (ref 6–20)
CALCIUM: 9.3 mg/dL (ref 8.9–10.3)
CO2: 27 mmol/L (ref 22–32)
Chloride: 108 mmol/L (ref 98–111)
Creatinine, Ser: 2.03 mg/dL — ABNORMAL HIGH (ref 0.61–1.24)
GFR calc non Af Amer: 37 mL/min — ABNORMAL LOW (ref >60)
GFR, EST AFRICAN AMERICAN: 43 mL/min — AB (ref >60)
Glucose, Bld: 101 mg/dL — ABNORMAL HIGH (ref 70–99)
Potassium: 4 mmol/L (ref 3.5–5.1)
Sodium: 142 mmol/L (ref 135–145)

## 2018-02-18 LAB — CBC
HCT: 41.9 % (ref 39.0–52.0)
Hemoglobin: 13.2 g/dL (ref 13.0–17.0)
MCH: 27.3 pg (ref 26.0–34.0)
MCHC: 31.5 g/dL (ref 30.0–36.0)
MCV: 86.6 fL (ref 80.0–100.0)
NRBC: 0 % (ref 0.0–0.2)
PLATELETS: 175 10*3/uL (ref 150–400)
RBC: 4.84 MIL/uL (ref 4.22–5.81)
RDW: 12.7 % (ref 11.5–15.5)
WBC: 4.3 10*3/uL (ref 4.0–10.5)

## 2018-02-18 LAB — TROPONIN I: Troponin I: <0.03 ng/mL (ref <0.03)

## 2018-02-18 MED ORDER — ASPIRIN EC 325 MG PO TBEC: 325.0000 mg | DELAYED_RELEASE_TABLET | Freq: Every day | ORAL | 0 refills | Status: DC

## 2018-02-18 MED ORDER — ASPIRIN 81 MG PO CHEW
324.0000 mg | CHEWABLE_TABLET | Freq: Once | ORAL | Status: AC
  Administered 2018-02-18: 324 mg via ORAL
  Filled 2018-02-18: qty 4

## 2018-02-18 MED ORDER — NITROGLYCERIN 0.4 MG SL SUBL
0.4000 mg | SUBLINGUAL_TABLET | SUBLINGUAL | Status: DC | PRN
  Administered 2018-02-18: 0.4 mg via SUBLINGUAL
  Filled 2018-02-18: qty 1
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
## 2018-03-25 ENCOUNTER — Emergency Department
Admission: EM | Admit: 2018-03-25 | Discharge: 2018-03-25 | Disposition: A | Discharge status: Home / Self Care | Payer: Commercial Managed Care - PPO | Attending: Emergency Medicine | Admitting: Emergency Medicine

## 2018-03-25 ENCOUNTER — Other Ambulatory Visit: Payer: Self-pay

## 2018-03-25 ENCOUNTER — Emergency Department: Payer: Commercial Managed Care - PPO

## 2018-03-25 DIAGNOSIS — I10 Essential (primary) hypertension: Secondary | ICD-10-CM | POA: Diagnosis not present

## 2018-03-25 DIAGNOSIS — R1032 Left lower quadrant pain: Secondary | ICD-10-CM | POA: Insufficient documentation

## 2018-03-25 DIAGNOSIS — R109 Unspecified abdominal pain: Secondary | ICD-10-CM

## 2018-03-25 LAB — URINALYSIS, COMPLETE (UACMP) WITH MICROSCOPIC
Bacteria, UA: NONE SEEN
Bilirubin Urine: NEGATIVE
GLUCOSE, UA: NEGATIVE mg/dL
HGB URINE DIPSTICK: NEGATIVE
KETONES UR: NEGATIVE mg/dL
LEUKOCYTES UA: NEGATIVE
Nitrite: NEGATIVE
PH: 7 (ref 5.0–8.0)
PROTEIN: NEGATIVE mg/dL
Specific Gravity, Urine: 1.008 (ref 1.005–1.030)
Squamous Epithelial / LPF: NONE SEEN (ref 0–5)

## 2018-03-25 LAB — CBC
HCT: 41.2 % (ref 39.0–52.0)
Hemoglobin: 13.1 g/dL (ref 13.0–17.0)
MCH: 27.1 pg (ref 26.0–34.0)
MCHC: 31.8 g/dL (ref 30.0–36.0)
MCV: 85.3 fL (ref 80.0–100.0)
Platelets: 200 10*3/uL (ref 150–400)
RBC: 4.83 MIL/uL (ref 4.22–5.81)
RDW: 12.4 % (ref 11.5–15.5)
WBC: 3.4 10*3/uL — ABNORMAL LOW (ref 4.0–10.5)
nRBC: 0 % (ref 0.0–0.2)

## 2018-03-25 LAB — BASIC METABOLIC PANEL
Anion gap: 6 (ref 5–15)
BUN: 17 mg/dL (ref 6–20)
CO2: 28 mmol/L (ref 22–32)
Calcium: 9.3 mg/dL (ref 8.9–10.3)
Chloride: 105 mmol/L (ref 98–111)
Creatinine, Ser: 2 mg/dL — ABNORMAL HIGH (ref 0.61–1.24)
GFR calc Af Amer: 44 mL/min — ABNORMAL LOW (ref >60)
GFR, EST NON AFRICAN AMERICAN: 38 mL/min — AB (ref >60)
GLUCOSE: 94 mg/dL (ref 70–99)
Potassium: 4.2 mmol/L (ref 3.5–5.1)
Sodium: 139 mmol/L (ref 135–145)

## 2018-03-25 MED ORDER — DIAZEPAM 5 MG PO TABS: 5.0000 mg | ORAL_TABLET | Freq: Three times a day (TID) | ORAL | 0 refills | Status: DC | PRN

## 2018-03-25 MED ORDER — OXYCODONE-ACETAMINOPHEN 5-325 MG PO TABS
2.0000 | ORAL_TABLET | Freq: Once | ORAL | Status: AC
  Administered 2018-03-25: 2 via ORAL
  Filled 2018-03-25: qty 2

## 2018-03-25 NOTE — ED Triage Notes (Signed)
Left sided flank pain and lower back pain X 2 days. Hx of kidney stones and polycystic kidney disease. Pt alert and oriented X4, active, cooperative, pt in NAD. RR even and unlabored, color WNL.

## 2018-03-25 NOTE — ED Notes (Signed)
Pt c/o left flank pain with nausea for the past 2 days, states he has a hx of kidney stones and this feels similar. States he also has a hx of polycystic kidney disease.

## 2018-03-25 NOTE — ED Provider Notes (Signed)
[facility] Emergency Department Provider Note       Time seen: ----------------------------------------- 7:07 PM on 03/25/2018 -----------------------------------------   I have reviewed the triage vital signs and the nursing notes.  HISTORY   Chief Complaint Flank Pain    HPI Izora GalaJames Boyd Viveros is a 52 y.o. male with a history of hypertension, kidney stones, polycystic kidney disease who presents to the ED for left-sided flank pain and lower back pain for the past 2 days.  He reports he is not sure if it is coming from polycystic kidney disease or kidney stones.  He denies fevers, chills or other complaints.  Past Medical History:  Diagnosis Date  . Arthritis    hands, shoulders  . Hypertension   . Kidney stone   . Kidney stone   . Kidney stones   . Polycystic kidney disease   . Renal disorder   . Wears contact lenses   . Wears dentures    partial upper and lower    There are no active problems to display for this patient.   Past Surgical History:  Procedure Laterality Date  . ANKLE SURGERY Left   . EXCISION MASS NECK Left 10/31/2017   Procedure: EXCISION MASS NECK;  Surgeon: Bud FaceVaught, Creighton, MD;  Location[facility];  Service: ENT;  Laterality: Left;  . KNEE SURGERY Bilateral   . SHOULDER SURGERY Left   . WRIST SURGERY Left     Allergies Bactrim [sulfamethoxazole-trimethoprim]; Calcium; Calcium-containing compounds; Penicillin g; and Penicillins  Social History Social History   Tobacco Use  . Smoking status: Never Smoker  . Smokeless tobacco: Current User    Types: Snuff  Substance Use Topics  . Alcohol use: No  . Drug use: No   Review of Systems Constitutional: Negative for fever. Cardiovascular: Negative for chest pain. Respiratory: Negative for shortness of breath. Gastrointestinal: Positive for flank pain Musculoskeletal: Positive for back pain Skin: Negative for rash. Neurological: Negative for headaches,  focal weakness or numbness.  All systems negative/normal/unremarkable except as stated in the HPI  ____________________________________________   PHYSICAL EXAM:  VITAL SIGNS: ED Triage Vitals  Enc Vitals Group     BP 03/25/18 1550 (!) 164/118     Pulse Rate 03/25/18 1550 66     Resp 03/25/18 1550 18     Temp 03/25/18 1550 98.1 F (36.7 C)     Temp Source 03/25/18 1550 Oral     SpO2 03/25/18 1550 100 %     Weight 03/25/18 1551 143 lb (64.9 kg)     Height 03/25/18 1551 5\' 8"  (1.727 m)     Head Circumference --      Peak Flow --      Pain Score 03/25/18 1551 5     Pain Loc --      Pain Edu? --      Excl. in GC? --    Constitutional: Alert and oriented. Well appearing and in no distress. Cardiovascular: Normal rate, regular rhythm. No murmurs, rubs, or gallops. Respiratory: Normal respiratory effort without tachypnea nor retractions. Breath sounds are clear and equal bilaterally. No wheezes/rales/rhonchi. Gastrointestinal: Soft and nontender. Normal bowel sounds Musculoskeletal: Nontender with normal range of motion in extremities. No lower extremity tenderness nor edema. Neurologic:  Normal speech and language. No gross focal neurologic deficits are appreciated.  Skin:  Skin is warm, dry and intact. No rash noted. Psychiatric: Mood and affect are normal. Speech and behavior are normal.  ____________________________________________  ED COURSE:  As part  of my medical decision making, I reviewed the following data within the electronic MEDICAL RECORD NUMBER History obtained from family if available, nursing notes, old chart and ekg, as well as notes from prior ED visits. Patient presented for flank pain, we will assess with labs and imaging as indicated at this time.   Procedures ____________________________________________   LABS (pertinent positives/negatives)  Labs Reviewed  URINALYSIS, COMPLETE (UACMP) WITH MICROSCOPIC - Abnormal; Notable for the following components:       Result Value   Color, Urine STRAW (*)    APPearance CLEAR (*)    All other components within normal limits  BASIC METABOLIC PANEL - Abnormal; Notable for the following components:   Creatinine, Ser 2.00 (*)    GFR calc non Af Amer 38 (*)    GFR calc Af Amer 44 (*)    All other components within normal limits  CBC - Abnormal; Notable for the following components:   WBC 3.4 (*)    All other components within normal limits    RADIOLOGY Images were viewed by me  CT renal protocol is negative for any acute process  ____________________________________________  DIFFERENTIAL DIAGNOSIS   Muscle strain, renal colic, UTI, pyelonephritis, ruptured renal cyst  FINAL ASSESSMENT AND PLAN  Flank pain   Plan: The patient had presented for flank pain of uncertain etiology. Patient's labs were negative. Patient's imaging was also negative.  I will prescribe muscle relaxants for him.  He is cleared for outpatient follow-up.   Ulice DashJohnathan E Williams, MD   Note: This note was generated in part or whole with voice recognition software. Voice recognition is usually quite accurate but there are transcription errors that can and very often do occur. I apologize for any typographical errors that were not detected and corrected.     Emily FilbertWilliams, Jonathan E, MD 03/25/18 [id number]

## 2018-03-27 ENCOUNTER — Emergency Department: Payer: Commercial Managed Care - PPO

## 2018-03-27 ENCOUNTER — Other Ambulatory Visit: Payer: Self-pay

## 2018-03-27 DIAGNOSIS — R1032 Left lower quadrant pain: Secondary | ICD-10-CM | POA: Insufficient documentation

## 2018-03-27 DIAGNOSIS — M545 Low back pain: Secondary | ICD-10-CM | POA: Insufficient documentation

## 2018-03-27 DIAGNOSIS — I10 Essential (primary) hypertension: Secondary | ICD-10-CM | POA: Diagnosis not present

## 2018-03-27 DIAGNOSIS — Z79899 Other long term (current) drug therapy: Secondary | ICD-10-CM | POA: Diagnosis not present

## 2018-03-27 DIAGNOSIS — Z7982 Long term (current) use of aspirin: Secondary | ICD-10-CM | POA: Diagnosis not present

## 2018-03-27 LAB — URINALYSIS, COMPLETE (UACMP) WITH MICROSCOPIC
BACTERIA UA: NONE SEEN
Bilirubin Urine: NEGATIVE
GLUCOSE, UA: NEGATIVE mg/dL
Hgb urine dipstick: NEGATIVE
Ketones, ur: NEGATIVE mg/dL
Leukocytes, UA: NEGATIVE
Nitrite: NEGATIVE
PROTEIN: NEGATIVE mg/dL
SPECIFIC GRAVITY, URINE: 1.004 — AB (ref 1.005–1.030)
Squamous Epithelial / LPF: NONE SEEN (ref 0–5)
WBC UA: NONE SEEN WBC/hpf (ref 0–5)
pH: 6 (ref 5.0–8.0)

## 2018-03-27 NOTE — ED Triage Notes (Signed)
Pt arrives to ED via POV with c/o left-sided flank pain and testicle pain x3 days. Pt reports being seen here for same on Monday and with no abnormal results. Pt denies fever, no N/V/D.

## 2018-03-28 ENCOUNTER — Emergency Department
Admission: EM | Admit: 2018-03-28 | Discharge: 2018-03-28 | Disposition: A | Discharge status: Home / Self Care | Payer: Commercial Managed Care - PPO | Attending: Emergency Medicine | Admitting: Emergency Medicine

## 2018-03-28 DIAGNOSIS — N50819 Testicular pain, unspecified: Secondary | ICD-10-CM

## 2018-03-28 DIAGNOSIS — R109 Unspecified abdominal pain: Secondary | ICD-10-CM

## 2018-03-28 DIAGNOSIS — M545 Low back pain, unspecified: Secondary | ICD-10-CM

## 2018-03-28 LAB — COMPREHENSIVE METABOLIC PANEL
ALT: 13 U/L (ref 0–44)
AST: 19 U/L (ref 15–41)
Albumin: 4.2 g/dL (ref 3.5–5.0)
Alkaline Phosphatase: 60 U/L (ref 38–126)
Anion gap: 7 (ref 5–15)
BUN: 22 mg/dL — AB (ref 6–20)
CHLORIDE: 106 mmol/L (ref 98–111)
CO2: 26 mmol/L (ref 22–32)
CREATININE: 2.16 mg/dL — AB (ref 0.61–1.24)
Calcium: 9 mg/dL (ref 8.9–10.3)
GFR calc Af Amer: 40 mL/min — ABNORMAL LOW (ref >60)
GFR, EST NON AFRICAN AMERICAN: 34 mL/min — AB (ref >60)
Glucose, Bld: 91 mg/dL (ref 70–99)
POTASSIUM: 3.4 mmol/L — AB (ref 3.5–5.1)
SODIUM: 139 mmol/L (ref 135–145)
Total Bilirubin: 0.6 mg/dL (ref 0.3–1.2)
Total Protein: 7.4 g/dL (ref 6.5–8.1)

## 2018-03-28 LAB — CBC WITH DIFFERENTIAL/PLATELET
ABS IMMATURE GRANULOCYTES: 0.01 10*3/uL (ref 0.00–0.07)
BASOS ABS: 0 10*3/uL (ref 0.0–0.1)
BASOS PCT: 0 %
Eosinophils Absolute: 0.1 10*3/uL (ref 0.0–0.5)
Eosinophils Relative: 3 %
HCT: 40.6 % (ref 39.0–52.0)
Hemoglobin: 13 g/dL (ref 13.0–17.0)
IMMATURE GRANULOCYTES: 0 %
Lymphocytes Relative: 40 %
Lymphs Abs: 1.9 10*3/uL (ref 0.7–4.0)
MCH: 27 pg (ref 26.0–34.0)
MCHC: 32 g/dL (ref 30.0–36.0)
MCV: 84.4 fL (ref 80.0–100.0)
Monocytes Absolute: 0.3 10*3/uL (ref 0.1–1.0)
Monocytes Relative: 6 %
NEUTROS ABS: 2.4 10*3/uL (ref 1.7–7.7)
NEUTROS PCT: 51 %
NRBC: 0 % (ref 0.0–0.2)
PLATELETS: 241 10*3/uL (ref 150–400)
RBC: 4.81 MIL/uL (ref 4.22–5.81)
RDW: 12.2 % (ref 11.5–15.5)
WBC: 4.8 10*3/uL (ref 4.0–10.5)

## 2018-03-28 LAB — ETHANOL: Alcohol, Ethyl (B): <10 mg/dL (ref <10)

## 2018-03-28 LAB — LIPASE, BLOOD: LIPASE: 40 U/L (ref 11–51)

## 2018-03-28 LAB — TROPONIN I

## 2018-03-28 MED ORDER — OXYCODONE-ACETAMINOPHEN 5-325 MG PO TABS: 1.0000 | ORAL_TABLET | ORAL | 0 refills | Status: DC | PRN

## 2018-03-28 MED ORDER — OXYCODONE-ACETAMINOPHEN 5-325 MG PO TABS
1.0000 | ORAL_TABLET | Freq: Once | ORAL | Status: AC
  Administered 2018-03-28: 1 via ORAL
  Filled 2018-03-28: qty 1

## 2018-03-28 MED ORDER — SODIUM CHLORIDE 0.9 % IV BOLUS
1000.0000 mL | Freq: Once | INTRAVENOUS | Status: AC
  Administered 2018-03-28: 1000 mL via INTRAVENOUS

## 2018-03-28 MED ORDER — MORPHINE SULFATE (PF) 4 MG/ML IV SOLN
4.0000 mg | Freq: Once | INTRAVENOUS | Status: AC
  Administered 2018-03-28: 4 mg via INTRAVENOUS
  Filled 2018-03-28: qty 1

## 2018-03-28 MED ORDER — ONDANSETRON HCL 4 MG/2ML IJ SOLN
4.0000 mg | Freq: Once | INTRAMUSCULAR | Status: AC
  Administered 2018-03-28: 4 mg via INTRAVENOUS
  Filled 2018-03-28: qty 2

## 2018-03-28 NOTE — Discharge Instructions (Addendum)
1.  Take pain medicine as needed (Percocet).  Continue muscle relaxers previously prescribed. 2.  Apply moist heat to affected area several times daily. 3.  Return to the ER for worsening symptoms, persistent vomiting, difficulty breathing or other concerns.

## 2018-03-28 NOTE — ED Notes (Signed)
Pt educated on opioid/opiate driving precautions. Pt called wife and will be discharged to the lobby to wait for his ride. Pt is AOx4 and verbalizes understanding.

## 2018-03-28 NOTE — ED Provider Notes (Signed)
[facility] Emergency Department Provider Note   ____________________________________________   First MD Initiated Contact with Patient 03/28/18 [phone]     (approximate)  I have reviewed the triage vital signs and the nursing notes.   HISTORY  Chief Complaint Flank Pain and Testicle Pain    HPI John Gregory is a 54 y.o. male who returns to the ED from home with persistent left-sided flank pain and testicle pain.  Symptoms x3 days.  Patient was seen in the ED several days ago for same with negative CT renal colic study.  He was discharged home with Valium which he states has not been helping.  Pain is worsened by movement.  Patient also endorses generalized malaise, nausea and dizziness.  Denies associated fever, chills, chest pain, shortness of breath, abdominal pain, nausea, vomiting, diarrhea.  Denies recent travel or trauma.   Past Medical History:  Diagnosis Date  . Arthritis    hands, shoulders  . Hypertension   . Kidney stone   . Kidney stone   . Kidney stones   . Polycystic kidney disease   . Renal disorder   . Wears contact lenses   . Wears dentures    partial upper and lower    There are no active problems to display for this patient.   Past Surgical History:  Procedure Laterality Date  . ANKLE SURGERY Left   . EXCISION MASS NECK Left 10/31/2017   Procedure: EXCISION MASS NECK;  Surgeon: Bud Face, MD;  Location: [facility];  Service: ENT;  Laterality: Left;  . KNEE SURGERY Bilateral   . SHOULDER SURGERY Left   . WRIST SURGERY Left     Prior to Admission medications   Medication Sig Start Date End Date Taking? Authorizing Provider  amLODipine (NORVASC) 10 MG tablet  10/20/15   [provider]  aspirin EC 325 MG tablet Take 1 tablet (325 mg total) by mouth daily. 02/18/18   Sharman Cheek, MD  bacitracin 500 UNIT/GM ointment Apply 1 application topically 2 (two) times daily. 10/31/17   Vaught,  Roney Mans, MD  benazepril-hydrochlorthiazide (LOTENSIN HCT) 20-12.5 MG tablet Take 2 tablets by mouth.     [provider]  diazepam (VALIUM) 5 MG tablet Take 1 tablet (5 mg total) by mouth every 8 (eight) hours as needed for muscle spasms. 03/25/18   Emily Filbert, MD  doxycycline (MONODOX) 100 MG capsule Take 1 capsule (100 mg total) by mouth 2 (two) times daily. 10/31/17   Vaught, Roney Mans, MD  escitalopram (LEXAPRO) 10 MG tablet Take 10 mg by mouth daily.    [provider]  glycopyrrolate (ROBINUL) 1 MG tablet Take 1 mg by mouth 2 (two) times daily as needed.    [provider]  HYDROcodone-acetaminophen (NORCO) 5-325 MG tablet Take 1 tablet by mouth every 4 (four) hours as needed for moderate pain. 10/31/17   Vaught, Roney Mans, MD  ondansetron (ZOFRAN ODT) 4 MG disintegrating tablet Take 1 tablet (4 mg total) by mouth every 8 (eight) hours as needed for nausea or vomiting. 12/02/17   Jene Every, MD  ondansetron (ZOFRAN) 4 MG tablet Take 1 tablet (4 mg total) by mouth every 8 (eight) hours as needed for up to 10 doses for nausea or vomiting. 10/31/17   Vaught, Roney Mans, MD  oxyCODONE (ROXICODONE) 5 MG immediate release tablet Take 1 tablet (5 mg total) by mouth every 8 (eight) hours as needed. 11/28/17 11/28/18  Minna Antis, MD  oxyCODONE-acetaminophen (PERCOCET) 5-325 MG tablet  Take 1 tablet by mouth every 4 (four) hours as needed for severe pain. 12/02/17 12/02/18  Jene Every, MD  tamsulosin (FLOMAX) 0.4 MG CAPS capsule Take 1 capsule (0.4 mg total) by mouth daily. 12/02/17   Jene Every, MD    Allergies Bactrim [sulfamethoxazole-trimethoprim]; Calcium; Calcium-containing compounds; Penicillin g; and Penicillins  No family history on file.  Social History Social History   Tobacco Use  . Smoking status: Never Smoker  . Smokeless tobacco: Current User    Types: Snuff  Substance Use Topics  . Alcohol use: No  . Drug use: No    Review of  Systems  Constitutional: No fever/chills Eyes: No visual changes. ENT: No sore throat. Cardiovascular: Denies chest pain. Respiratory: Denies shortness of breath. Gastrointestinal: No abdominal pain.  Positive for nausea, no vomiting.  No diarrhea.  No constipation. Genitourinary: Negative for dysuria. Musculoskeletal: Positive for back pain. Skin: Negative for rash. Neurological: Negative for headaches, focal weakness or numbness.   ____________________________________________   PHYSICAL EXAM:  VITAL SIGNS: ED Triage Vitals  Enc Vitals Group     BP 03/27/18 2043 (!) 158/100     Pulse Rate 03/27/18 2043 73     Resp 03/27/18 2043 18     Temp 03/27/18 2043 99 F (37.2 C)     Temp Source 03/27/18 2043 Oral     SpO2 03/27/18 2043 100 %     Weight 03/27/18 2043 143 lb (64.9 kg)     Height 03/27/18 2043 5\' 10"  (1.778 m)     Head Circumference --      Peak Flow --      Pain Score 03/27/18 2052 8     Pain Loc --      Pain Edu? --      Excl. in [place]? --     Constitutional: Alert and oriented. Well appearing and in mild acute distress. Eyes: Conjunctivae are normal. PERRL. EOMI. Head: Atraumatic. Nose: No congestion/rhinnorhea. Mouth/Throat: Mucous membranes are moist.  Oropharynx non-erythematous. Neck: No stridor.   Cardiovascular: Normal rate, regular rhythm. Grossly normal heart sounds.  Good peripheral circulation. Respiratory: Normal respiratory effort.  No retractions. Lungs CTAB. Gastrointestinal: Soft and nontender to light or deep palpation. No distention. No abdominal bruits.  Mild left CVA tenderness. Genitourinary: Normal male genitalia.  No testicular pain or swelling.  Strong bilateral cremasteric reflexes. Musculoskeletal: No lower extremity tenderness nor edema.  No joint effusions. Neurologic:  Normal speech and language. No gross focal neurologic deficits are appreciated. No gait instability. Skin:  Skin is warm, dry and intact. No rash noted. Psychiatric:  Mood and affect are normal. Speech and behavior are normal.  ____________________________________________   LABS (all labs ordered are listed, but only abnormal results are displayed)  Labs Reviewed  URINALYSIS, COMPLETE (UACMP) WITH MICROSCOPIC - Abnormal; Notable for the following components:      Result Value   Color, Urine STRAW (*)    APPearance CLEAR (*)    Specific Gravity, Urine 1.004 (*)    All other components within normal limits  COMPREHENSIVE METABOLIC PANEL - Abnormal; Notable for the following components:   Potassium 3.4 (*)    BUN 22 (*)    Creatinine, Ser 2.16 (*)    GFR calc non Af Amer 34 (*)    GFR calc Af Amer 40 (*)    All other components within normal limits  CBC WITH DIFFERENTIAL/PLATELET  TROPONIN I  LIPASE, BLOOD  ETHANOL   ____________________________________________  EKG  ED ECG REPORT  I, Irean HongSUNG,Paz Winsett J, the attending physician, personally viewed and interpreted this ECG.   Date: 03/28/2018  EKG Time: 0344  Rate: 57  Rhythm: normal EKG, normal sinus rhythm  Axis: Normal  Intervals:none  ST&T Change: Nonspecific  ____________________________________________  RADIOLOGY  ED MD interpretation: Normal scrotal ultrasound  Official radiology report(s): [place]as Scrotum W/doppler  Result Date: 03/27/2018 CLINICAL DATA:  Testicle pain EXAM: SCROTAL ULTRASOUND DOPPLER ULTRASOUND OF THE TESTICLES TECHNIQUE: Complete ultrasound examination of the testicles, epididymis, and other scrotal structures was performed. Color and spectral Doppler ultrasound were also utilized to evaluate blood flow to the testicles. COMPARISON:  None. FINDINGS: Right testicle Measurements: 3.4 x 1.9 x 2.3 cm. No mass or microlithiasis visualized. Left testicle Measurements: 3.1 x 1.7 x 2.4 cm. No mass or microlithiasis visualized. Right epididymis:  Normal in size and appearance. Left epididymis:  Normal in size and appearance. Hydrocele:  None visualized. Varicocele:  None  visualized. Pulsed Doppler interrogation of both testes demonstrates normal low resistance arterial and venous waveforms bilaterally. IMPRESSION: Negative scrotal ultrasound.  No torsion. Electronically Signed   By: Jasmine PangKim  Fujinaga M.D.   On: 03/27/2018 22:08    ____________________________________________   PROCEDURES  Procedure(s) performed: None  Procedures  Critical Care performed: No  ____________________________________________   INITIAL IMPRESSION / ASSESSMENT AND PLAN / ED COURSE  As part of my medical decision making, I reviewed the following data within the electronic MEDICAL RECORD NUMBER Nursing notes reviewed and incorporated, Labs reviewed, EKG interpreted, Old chart reviewed, Radiograph reviewed and Notes from prior ED visits   53 year old male who returns for left flank and now left testicular pain. Differential diagnosis includes, but is not limited to, acute appendicitis, renal colic, testicular torsion, urinary tract infection/pyelonephritis, prostatitis,  epididymitis, diverticulitis, small bowel obstruction or ileus, colitis, abdominal aortic aneurysm, gastroenteritis, hernia, etc.  I personally reviewed patient's most recent ED visit and note stable renal insufficiency and unremarkable CT renal colic study.  Tonight his urinalysis is unremarkable as well as his testicular ultrasound.  Pain most likely secondary to polycystic kidney disease and possibly musculoskeletal elements.  For the complaint of dizziness, will repeat lab work and also check troponin as well as EKG.  Will administer IV fluids,   Clinical Course as of Mar 28 456  Thu Mar 28, 2018  0435 Hemoglobin: 13.0 [JS]  0457 Pain much better.  Will discharge home on pain medicine, nephrology follow-up.  Strict return precautions given.  Patient verbalizes understanding and agrees with plan of care.   [JS]    Clinical Course User Index [JS] Irean HongSung, Cherlyn Syring J, MD      ____________________________________________   FINAL CLINICAL IMPRESSION(S) / ED DIAGNOSES  Final diagnoses:  Left flank pain  Acute left-sided low back pain without sciatica     ED Discharge Orders    None       Note:  This document was prepared using Dragon voice recognition software and may include unintentional dictation errors.    Irean HongSung, Morelia Cassells J, MD 03/28/18 0600

## 2018-09-05 ENCOUNTER — Other Ambulatory Visit: Payer: Self-pay

## 2018-09-05 ENCOUNTER — Emergency Department
Admission: EM | Admit: 2018-09-05 | Discharge: 2018-09-05 | Disposition: A | Discharge status: Home / Self Care | Payer: Commercial Managed Care - PPO | Attending: Emergency Medicine | Admitting: Emergency Medicine

## 2018-09-05 ENCOUNTER — Emergency Department: Payer: Commercial Managed Care - PPO

## 2018-09-05 DIAGNOSIS — Z79899 Other long term (current) drug therapy: Secondary | ICD-10-CM | POA: Insufficient documentation

## 2018-09-05 DIAGNOSIS — I10 Essential (primary) hypertension: Secondary | ICD-10-CM | POA: Insufficient documentation

## 2018-09-05 DIAGNOSIS — R0789 Other chest pain: Secondary | ICD-10-CM | POA: Diagnosis present

## 2018-09-05 DIAGNOSIS — Z7982 Long term (current) use of aspirin: Secondary | ICD-10-CM | POA: Diagnosis not present

## 2018-09-05 DIAGNOSIS — F17228 Nicotine dependence, chewing tobacco, with other nicotine-induced disorders: Secondary | ICD-10-CM | POA: Diagnosis not present

## 2018-09-05 LAB — BASIC METABOLIC PANEL
Anion gap: 9 (ref 5–15)
BUN: 18 mg/dL (ref 6–20)
CO2: 25 mmol/L (ref 22–32)
Calcium: 8.7 mg/dL — ABNORMAL LOW (ref 8.9–10.3)
Chloride: 105 mmol/L (ref 98–111)
Creatinine, Ser: 2.5 mg/dL — ABNORMAL HIGH (ref 0.61–1.24)
GFR calc Af Amer: 33 mL/min — ABNORMAL LOW (ref >60)
GFR calc non Af Amer: 29 mL/min — ABNORMAL LOW (ref >60)
Glucose, Bld: 92 mg/dL (ref 70–99)
Potassium: 3.1 mmol/L — ABNORMAL LOW (ref 3.5–5.1)
Sodium: 139 mmol/L (ref 135–145)

## 2018-09-05 LAB — CBC
HCT: 34.7 % — ABNORMAL LOW (ref 39.0–52.0)
Hemoglobin: 11.6 g/dL — ABNORMAL LOW (ref 13.0–17.0)
MCH: 27.4 pg (ref 26.0–34.0)
MCHC: 33.4 g/dL (ref 30.0–36.0)
MCV: 81.8 fL (ref 80.0–100.0)
Platelets: 211 10*3/uL (ref 150–400)
RBC: 4.24 MIL/uL (ref 4.22–5.81)
RDW: 12.7 % (ref 11.5–15.5)
WBC: 3.7 10*3/uL — ABNORMAL LOW (ref 4.0–10.5)
nRBC: 0 % (ref 0.0–0.2)

## 2018-09-05 LAB — TROPONIN I
Troponin I: <0.03 ng/mL (ref <0.03)
Troponin I: <0.03 ng/mL (ref <0.03)

## 2018-09-05 MED ORDER — SODIUM CHLORIDE 0.9% FLUSH: 3.0000 mL | Freq: Once | INTRAVENOUS | Status: DC

## 2018-09-05 MED ORDER — ACETAMINOPHEN 325 MG PO TABS
650.0000 mg | ORAL_TABLET | Freq: Once | ORAL | Status: AC
  Administered 2018-09-05: 18:00:00 650 mg via ORAL
  Filled 2018-09-05: qty 2

## 2018-09-05 NOTE — ED Triage Notes (Signed)
Pt c/o left sided chest pain that radiates into the left arm and back since this morning. Pt states he took about 12 baby aspirin throughout the day today. Pt is in NAD on arrival.

## 2018-09-05 NOTE — ED Provider Notes (Addendum)
[facility] Emergency Department Provider Note  ____________________________________________   I have reviewed the triage vital signs and the nursing notes. Where available I have reviewed prior notes and, if possible and indicated, outside hospital notes.    HISTORY  Chief Complaint Chest Pain    HPI John Gregory is a 53 y.o. male history of polycystic kidney disease, hypertension, chronic kidney stones, presents today with left chest wall discomfort.  He states it happened while he was lifting heavy boxes yesterday he felt a pulling sensation using that arm.  Like a pulled muscle.  He has had this discomfort since this happened yesterday without stop.  It is worse when he touches it worse when he changes position worse when he moves the wrong way.  As long as he sits still, it is fine.  No other alleviating or aggravating symptoms no radiation, he can specify the exact point in a intercostal region where it is tender.  No other associated symptoms.    Past Medical History:  Diagnosis Date  . Arthritis    hands, shoulders  . Hypertension   . Kidney stone   . Kidney stone   . Kidney stones   . Polycystic kidney disease   . Renal disorder   . Wears contact lenses   . Wears dentures    partial upper and lower    There are no active problems to display for this patient.   Past Surgical History:  Procedure Laterality Date  . ANKLE SURGERY Left   . EXCISION MASS NECK Left 10/31/2017   Procedure: EXCISION MASS NECK;  Surgeon: Bud FaceVaught, Creighton, MD;  [facility];  Service: ENT;  Laterality: Left;  . KNEE SURGERY Bilateral   . SHOULDER SURGERY Left   . WRIST SURGERY Left     Prior to Admission medications   Medication Sig Start Date End Date Taking? Authorizing Provider  amLODipine (NORVASC) 10 MG tablet  10/20/15   [provider]  aspirin EC 325 MG tablet Take 1 tablet (325 mg total) by mouth daily. 02/18/18   Sharman CheekStafford,  Phillip, MD  bacitracin 500 UNIT/GM ointment Apply 1 application topically 2 (two) times daily. 10/31/17   Vaught, Roney Mansreighton, MD  benazepril-hydrochlorthiazide (LOTENSIN HCT) 20-12.5 MG tablet Take 2 tablets by mouth.     [provider]  diazepam (VALIUM) 5 MG tablet Take 1 tablet (5 mg total) by mouth every 8 (eight) hours as needed for muscle spasms. 03/25/18   Emily FilbertWilliams, Jonathan E, MD  doxycycline (MONODOX) 100 MG capsule Take 1 capsule (100 mg total) by mouth 2 (two) times daily. 10/31/17   Vaught, Roney Mansreighton, MD  escitalopram (LEXAPRO) 10 MG tablet Take 10 mg by mouth daily.    [provider]  glycopyrrolate (ROBINUL) 1 MG tablet Take 1 mg by mouth 2 (two) times daily as needed.    [provider]  HYDROcodone-acetaminophen (NORCO) 5-325 MG tablet Take 1 tablet by mouth every 4 (four) hours as needed for moderate pain. 10/31/17   Vaught, Roney Mansreighton, MD  ondansetron (ZOFRAN ODT) 4 MG disintegrating tablet Take 1 tablet (4 mg total) by mouth every 8 (eight) hours as needed for nausea or vomiting. 12/02/17   Jene EveryKinner, Robert, MD  ondansetron (ZOFRAN) 4 MG tablet Take 1 tablet (4 mg total) by mouth every 8 (eight) hours as needed for up to 10 doses for nausea or vomiting. 10/31/17   Vaught, Roney Mansreighton, MD  oxyCODONE (ROXICODONE) 5 MG immediate release tablet Take 1 tablet (5  mg total) by mouth every 8 (eight) hours as needed. 11/28/17 11/28/18  Harvest Dark, MD  oxyCODONE-acetaminophen (PERCOCET/ROXICET) 5-325 MG tablet Take 1 tablet by mouth every 4 (four) hours as needed for severe pain. 03/28/18   Paulette Blanch, MD  tamsulosin (FLOMAX) 0.4 MG CAPS capsule Take 1 capsule (0.4 mg total) by mouth daily. 12/02/17   Lavonia Drafts, MD    Allergies Bactrim [sulfamethoxazole-trimethoprim], Calcium, Calcium-containing compounds, Penicillin g, and Penicillins  No family history on file.  Social History Social History   Tobacco Use  . Smoking status: Never Smoker  . Smokeless tobacco:  Current User    Types: Snuff  Substance Use Topics  . Alcohol use: No  . Drug use: No    Review of Systems Constitutional: No fever/chills Eyes: No visual changes. ENT: No sore throat. No stiff neck no neck pain Cardiovascular: + chest pain. Respiratory: Denies shortness of breath. Gastrointestinal:   no vomiting.  No diarrhea.  No constipation. Genitourinary: Negative for dysuria. Musculoskeletal: Negative lower extremity swelling Skin: Negative for rash. Neurological: Negative for severe headaches, focal weakness or numbness.   ____________________________________________   PHYSICAL EXAM:  VITAL SIGNS: ED Triage Vitals  Enc Vitals Group     BP 09/05/18 1355 (!) 155/96     Pulse Rate 09/05/18 1355 (!) 55     Resp 09/05/18 1355 16     Temp 09/05/18 1355 98.2 F (36.8 C)     Temp Source 09/05/18 1355 Oral     SpO2 09/05/18 1355 100 %     Weight 09/05/18 1356 145 lb (65.8 kg)     Height 09/05/18 1356 5\' 10"  (1.778 m)     Head Circumference --      Peak Flow --      Pain Score 09/05/18 1356 9     Pain Loc --      Pain Edu? --      Excl. in [place]? --     Constitutional: Alert and oriented. Well appearing and in no acute distress.  Is quite anxious. Eyes: Conjunctivae are normal Head: Atraumatic HEENT: No congestion/rhinnorhea. Mucous membranes are moist.  Oropharynx non-erythematous Neck:   Nontender with no meningismus, no masses, no stridor Cardiovascular: Normal rate, regular rhythm. Grossly normal heart sounds.  Good peripheral circulation. Respiratory: Normal respiratory effort.  No retractions. Lungs CTAB. Chest: Tender palpation left chest when I touch his chest in this exact spot just to the clavicular line, lateral to the left nipple, it is tender.  Patient can exactly specify the spot where it hurts.  It hurts when I push in.  Hurts when he changes position, hurts when he pulls up in the chair or moves.  I touch this area he states "ouch that the pain right  there" and pulls back.  There is no crepitus there is no flow just there is no erythema there is no warmth to touch, there is no evidence of infection, or rib fracture Abdominal: Soft and nontender. No distention. No guarding no rebound Back:  There is no focal tenderness or step off.  there is no midline tenderness there are no lesions noted. there is no CVA tenderness  Musculoskeletal: No lower extremity tenderness, no upper extremity tenderness. No joint effusions, no DVT signs strong distal pulses no edema Neurologic:  Normal speech and language. No gross focal neurologic deficits are appreciated.  Skin:  Skin is warm, dry and intact. No rash noted. Psychiatric: Mood and affect are normal. Speech and behavior are  normal.  ____________________________________________   LABS (all labs ordered are listed, but only abnormal results are displayed)  Labs Reviewed  BASIC METABOLIC PANEL - Abnormal; Notable for the following components:      Result Value   Potassium 3.1 (*)    Creatinine, Ser 2.50 (*)    Calcium 8.7 (*)    GFR calc non Af Amer 29 (*)    GFR calc Af Amer 33 (*)    All other components within normal limits  CBC - Abnormal; Notable for the following components:   WBC 3.7 (*)    Hemoglobin 11.6 (*)    HCT 34.7 (*)    All other components within normal limits  TROPONIN I  TROPONIN I    Pertinent labs  results that were available during my care of the patient were reviewed by me and considered in my medical decision making (see chart for details). ____________________________________________  EKG  I personally interpreted any EKGs ordered by me or triage Sinus rhythm rate 55 bpm, no acute ST elevation or depression normal axis, nonspecific ST changes.  No acute ischemia no change from November 2019 ____________________________________________  RADIOLOGY  Pertinent labs & imaging results that were available during my care of the patient were reviewed by me and  considered in my medical decision making (see chart for details). If possible, patient and/or family made aware of any abnormal findings.  Dg Chest 2 View  Result Date: 09/05/2018 CLINICAL DATA:  LEFT-sided chest pain. EXAM: CHEST - 2 VIEW COMPARISON:  02/18/2018. FINDINGS: The heart size and mediastinal contours are within normal limits. Both lungs are clear. Degenerative scoliosis, leftward convex upper thoracic region. No effusion or pneumothorax. IMPRESSION: No active cardiopulmonary disease. Scoliosis.  No edema or consolidation. Electronically Signed   By: Elsie StainJohn T Curnes M.D.   On: 09/05/2018 14:41   ____________________________________________    PROCEDURES  Procedure(s) performed: None  Procedures  Critical Care performed: None  ____________________________________________   INITIAL IMPRESSION / ASSESSMENT AND PLAN / ED COURSE  Pertinent labs & imaging results that were available during my care of the patient were reviewed by me and considered in my medical decision making (see chart for details). Patient here with very reproducible chest wall pain a very particular part of the chest nothing to suggest ACS PE dissection myocarditis endocarditis pneumonia pneumothorax pericarditis, or other acute intrathoracic pathology at this time.  It is a very reproducible pain in the muscle of the chest which is brought up on by using that muscle and recurrently irritated by the reuse of that muscle.  Nonetheless, we will get 2 sets of cardiac markers, if that is negative is my hope that we get him safely home.  His chronic creatinine issues, is followed by PCP.  ----------------------------------------- 7:16 PM on 09/05/2018 ----------------------------------------- Patient sound asleep blood pressure 150s, chest pain completely resolved with Tylenol, serial enzymes are negative, he declines admission to the hospital, he is concerned about coronavirus he states.  This is not unreasonable.  I  talked to Dr. Juliann Paresallwood, who can see him tomorrow morning, patient advised to return to the emergency room for new or worrisome symptoms.  At this time, there does not appear to be clinical evidence to support the diagnosis of pulmonary embolus, dissection, myocarditis, endocarditis, pericarditis, pericardial tamponade, acute coronary syndrome, pneumothorax, pneumonia, or any other acute intrathoracic pathology that will require admission or acute intervention. Nor is there evidence of any significant intra-abdominal pathology causing this discomfort.     ____________________________________________  FINAL CLINICAL IMPRESSION(S) / ED DIAGNOSES  Final diagnoses:  None      This chart was dictated using voice recognition software.  Despite best efforts to proofread,  errors can occur which can change meaning.     Jeanmarie PlantMcShane, Najib A, MD 09/05/18 1744    Jeanmarie PlantMcShane, Brevyn A, MD 09/05/18 [phone]

## 2018-09-05 NOTE — Discharge Instructions (Addendum)
Return to the emergency room for any new or worrisome symptoms including chest pain or shortness of breath, or if you change your mind about admission.  Take Tylenol for the discomfort in your chest.  If that changes return to the ER.  You would prefer not to get admitted which is certainly not unreasonable, please follow closely with cardiology tomorrow without fail.  Also follow-up with your primary care doctor for further work-up of your chronic kidney problems.

## 2018-09-05 NOTE — ED Notes (Signed)
Pt resting in bed. States pain is worse with movement, but subsides some at rest.

## 2018-09-05 NOTE — ED Notes (Signed)
Changed BP cuff. BP 161/106.

## 2018-09-05 NOTE — ED Notes (Signed)
Pt states pain is gone at present.

## 2019-02-25 ENCOUNTER — Encounter: Payer: Self-pay | Admitting: Emergency Medicine

## 2019-02-25 ENCOUNTER — Emergency Department: Payer: Commercial Managed Care - PPO

## 2019-02-25 ENCOUNTER — Emergency Department
Admission: EM | Admit: 2019-02-25 | Discharge: 2019-02-25 | Disposition: A | Discharge status: Home / Self Care | Payer: Commercial Managed Care - PPO | Attending: Emergency Medicine | Admitting: Emergency Medicine

## 2019-02-25 ENCOUNTER — Other Ambulatory Visit: Payer: Self-pay

## 2019-02-25 DIAGNOSIS — Y92410 Unspecified street and highway as the place of occurrence of the external cause: Secondary | ICD-10-CM | POA: Diagnosis not present

## 2019-02-25 DIAGNOSIS — Y9389 Activity, other specified: Secondary | ICD-10-CM | POA: Diagnosis not present

## 2019-02-25 DIAGNOSIS — F17220 Nicotine dependence, chewing tobacco, uncomplicated: Secondary | ICD-10-CM | POA: Diagnosis not present

## 2019-02-25 DIAGNOSIS — Y999 Unspecified external cause status: Secondary | ICD-10-CM | POA: Insufficient documentation

## 2019-02-25 DIAGNOSIS — M545 Low back pain: Secondary | ICD-10-CM | POA: Diagnosis not present

## 2019-02-25 DIAGNOSIS — I10 Essential (primary) hypertension: Secondary | ICD-10-CM | POA: Insufficient documentation

## 2019-02-25 DIAGNOSIS — Z79899 Other long term (current) drug therapy: Secondary | ICD-10-CM | POA: Insufficient documentation

## 2019-02-25 DIAGNOSIS — S161XXA Strain of muscle, fascia and tendon at neck level, initial encounter: Secondary | ICD-10-CM | POA: Insufficient documentation

## 2019-02-25 DIAGNOSIS — Z7982 Long term (current) use of aspirin: Secondary | ICD-10-CM | POA: Insufficient documentation

## 2019-02-25 DIAGNOSIS — S199XXA Unspecified injury of neck, initial encounter: Secondary | ICD-10-CM | POA: Diagnosis present

## 2019-02-25 MED ORDER — CYCLOBENZAPRINE HCL 10 MG PO TABS: 10.0000 mg | ORAL_TABLET | Freq: Three times a day (TID) | ORAL | 0 refills | Status: DC | PRN

## 2019-02-25 MED ORDER — TRAMADOL HCL 50 MG PO TABS: 50.0000 mg | ORAL_TABLET | Freq: Four times a day (QID) | ORAL | 0 refills | Status: DC | PRN

## 2019-02-25 MED ORDER — ORPHENADRINE CITRATE 30 MG/ML IJ SOLN
60.0000 mg | Freq: Two times a day (BID) | INTRAMUSCULAR | Status: DC
  Administered 2019-02-25: 08:00:00 60 mg via INTRAMUSCULAR
  Filled 2019-02-25: qty 2

## 2019-02-25 MED ORDER — IBUPROFEN 600 MG PO TABS: 600.0000 mg | ORAL_TABLET | Freq: Three times a day (TID) | ORAL | 0 refills | Status: DC | PRN

## 2019-02-25 MED ORDER — HYDROMORPHONE HCL 1 MG/ML IJ SOLN
1.0000 mg | Freq: Once | INTRAMUSCULAR | Status: AC
  Administered 2019-02-25: 1 mg via INTRAMUSCULAR
  Filled 2019-02-25: qty 1
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
## 2019-06-28 ENCOUNTER — Ambulatory Visit: Payer: Commercial Managed Care - PPO

## 2019-07-09 ENCOUNTER — Emergency Department: Payer: Commercial Managed Care - PPO

## 2019-07-09 ENCOUNTER — Other Ambulatory Visit: Payer: Self-pay

## 2019-07-09 ENCOUNTER — Emergency Department
Admission: EM | Admit: 2019-07-09 | Discharge: 2019-07-09 | Disposition: A | Discharge status: Home / Self Care | Payer: Commercial Managed Care - PPO | Attending: Emergency Medicine | Admitting: Emergency Medicine

## 2019-07-09 ENCOUNTER — Encounter: Payer: Self-pay | Admitting: Emergency Medicine

## 2019-07-09 DIAGNOSIS — Z7982 Long term (current) use of aspirin: Secondary | ICD-10-CM | POA: Insufficient documentation

## 2019-07-09 DIAGNOSIS — Z79899 Other long term (current) drug therapy: Secondary | ICD-10-CM | POA: Insufficient documentation

## 2019-07-09 DIAGNOSIS — R109 Unspecified abdominal pain: Secondary | ICD-10-CM | POA: Diagnosis present

## 2019-07-09 DIAGNOSIS — I10 Essential (primary) hypertension: Secondary | ICD-10-CM | POA: Diagnosis not present

## 2019-07-09 DIAGNOSIS — K4090 Unilateral inguinal hernia, without obstruction or gangrene, not specified as recurrent: Secondary | ICD-10-CM

## 2019-07-09 LAB — CBC
HCT: 36.8 % — ABNORMAL LOW (ref 39.0–52.0)
Hemoglobin: 12 g/dL — ABNORMAL LOW (ref 13.0–17.0)
MCH: 27.8 pg (ref 26.0–34.0)
MCHC: 32.6 g/dL (ref 30.0–36.0)
MCV: 85.4 fL (ref 80.0–100.0)
Platelets: 219 10*3/uL (ref 150–400)
RBC: 4.31 MIL/uL (ref 4.22–5.81)
RDW: 12.8 % (ref 11.5–15.5)
WBC: 4.6 10*3/uL (ref 4.0–10.5)
nRBC: 0 % (ref 0.0–0.2)

## 2019-07-09 LAB — BASIC METABOLIC PANEL
Anion gap: 9 (ref 5–15)
BUN: 16 mg/dL (ref 6–20)
CO2: 27 mmol/L (ref 22–32)
Calcium: 9 mg/dL (ref 8.9–10.3)
Chloride: 103 mmol/L (ref 98–111)
Creatinine, Ser: 2.72 mg/dL — ABNORMAL HIGH (ref 0.61–1.24)
GFR calc Af Amer: 30 mL/min — ABNORMAL LOW (ref >60)
GFR calc non Af Amer: 26 mL/min — ABNORMAL LOW (ref >60)
Glucose, Bld: 87 mg/dL (ref 70–99)
Potassium: 3.7 mmol/L (ref 3.5–5.1)
Sodium: 139 mmol/L (ref 135–145)

## 2019-07-09 LAB — URINALYSIS, COMPLETE (UACMP) WITH MICROSCOPIC
Bacteria, UA: NONE SEEN
Bilirubin Urine: NEGATIVE
Glucose, UA: NEGATIVE mg/dL
Hgb urine dipstick: NEGATIVE
Ketones, ur: NEGATIVE mg/dL
Leukocytes,Ua: NEGATIVE
Nitrite: NEGATIVE
Protein, ur: NEGATIVE mg/dL
Specific Gravity, Urine: 1.008 (ref 1.005–1.030)
pH: 6 (ref 5.0–8.0)

## 2019-07-09 MED ORDER — MORPHINE SULFATE (PF) 4 MG/ML IV SOLN
4.0000 mg | Freq: Once | INTRAVENOUS | Status: AC
  Administered 2019-07-09: 4 mg via INTRAVENOUS
  Filled 2019-07-09: qty 1

## 2019-07-09 MED ORDER — KETOROLAC TROMETHAMINE 30 MG/ML IJ SOLN
15.0000 mg | Freq: Once | INTRAMUSCULAR | Status: AC
  Administered 2019-07-09: 15 mg via INTRAVENOUS
  Filled 2019-07-09: qty 1

## 2019-07-09 MED ORDER — ONDANSETRON HCL 4 MG/2ML IJ SOLN
4.0000 mg | Freq: Once | INTRAMUSCULAR | Status: AC
  Administered 2019-07-09: 4 mg via INTRAVENOUS
  Filled 2019-07-09: qty 2

## 2019-07-09 MED ORDER — ONDANSETRON 4 MG PO TBDP: 4.0000 mg | ORAL_TABLET | Freq: Three times a day (TID) | ORAL | 0 refills | Status: DC | PRN

## 2019-07-09 MED ORDER — DOCUSATE SODIUM 100 MG PO CAPS: 100.0000 mg | ORAL_CAPSULE | Freq: Every day | ORAL | 0 refills | Status: DC

## 2019-07-09 MED ORDER — SODIUM CHLORIDE 0.9 % IV BOLUS
1000.0000 mL | Freq: Once | INTRAVENOUS | Status: AC
  Administered 2019-07-09: 1000 mL via INTRAVENOUS

## 2019-07-09 MED ORDER — HYDROCODONE-ACETAMINOPHEN 5-325 MG PO TABS: 1.0000 | ORAL_TABLET | Freq: Four times a day (QID) | ORAL | 0 refills | Status: DC | PRN

## 2019-07-09 MED ORDER — HYDROCODONE-ACETAMINOPHEN 5-325 MG PO TABS
2.0000 | ORAL_TABLET | Freq: Once | ORAL | Status: AC
  Administered 2019-07-09: 2 via ORAL
  Filled 2019-07-09: qty 2

## 2019-07-09 NOTE — ED Provider Notes (Signed)
[facility] Emergency Department Provider Note  ____________________________________________   First MD Initiated Contact with Patient 07/09/19 1805     (approximate)  I have reviewed the triage vital signs and the nursing notes.   HISTORY  Chief Complaint Flank Pain    HPI John Gregory is a 54 y.o. male here with left flank pain.  The patient has a history of recurrent flank pain and kidney stones.  He states his pain is localized to the left lower quadrant and occasionally radiates towards his left lower flank.  It is reproducible with movement and seems to be worse with certain positions.  He has had some mild nausea but no vomiting.  Symptoms feel very similar to his previous kidney stones.  The pain does radiate down towards his left groin.  He denies any actual testicular pain itself.  He does note some occasional bulging in his left upper inguinal area.  Denies any nausea or vomiting.  No diarrhea.  No fevers or chills.  No tearing or sharp midline back pain.  No history of aneurysm.       Past Medical History:  Diagnosis Date  . Arthritis    hands, shoulders  . Hypertension   . Kidney stone   . Kidney stone   . Kidney stones   . Polycystic kidney disease   . Renal disorder   . Wears contact lenses   . Wears dentures    partial upper and lower    There are no problems to display for this patient.   Past Surgical History:  Procedure Laterality Date  . ANKLE SURGERY Left   . EXCISION MASS NECK Left 10/31/2017   Procedure: EXCISION MASS NECK;  Surgeon: Bud Face, MD;  Location: [facility];  Service: ENT;  Laterality: Left;  . KNEE SURGERY Bilateral   . SHOULDER SURGERY Left   . WRIST SURGERY Left     Prior to Admission medications   Medication Sig Start Date End Date Taking? Authorizing Provider  aspirin EC 325 MG tablet Take 1 tablet (325 mg total) by mouth daily. 02/18/18   Sharman Cheek, MD    benazepril-hydrochlorthiazide (LOTENSIN HCT) 20-12.5 MG tablet Take 2 tablets by mouth.     [provider]  cyclobenzaprine (FLEXERIL) 10 MG tablet Take 1 tablet (10 mg total) by mouth 3 (three) times daily as needed. 02/25/19   Joni Reining, PA-C  docusate sodium (COLACE) 100 MG capsule Take 1 capsule (100 mg total) by mouth daily. 07/09/19 08/08/19  Shaune Pollack, MD  escitalopram (LEXAPRO) 10 MG tablet Take 10 mg by mouth daily.    [provider]  glycopyrrolate (ROBINUL) 1 MG tablet Take 1 mg by mouth 2 (two) times daily as needed.    [provider]  HYDROcodone-acetaminophen (NORCO/VICODIN) 5-325 MG tablet Take 1-2 tablets by mouth every 6 (six) hours as needed for moderate pain or severe pain. 07/09/19 07/08/20  Shaune Pollack, MD  ibuprofen (ADVIL) 600 MG tablet Take 1 tablet (600 mg total) by mouth every 8 (eight) hours as needed. 02/25/19   Joni Reining, PA-C  ondansetron (ZOFRAN ODT) 4 MG disintegrating tablet Take 1 tablet (4 mg total) by mouth every 8 (eight) hours as needed for nausea or vomiting. 07/09/19   Shaune Pollack, MD  traMADol (ULTRAM) 50 MG tablet Take 1 tablet (50 mg total) by mouth every 6 (six) hours as needed for moderate pain. 02/25/19   Joni Reining, PA-C  Allergies Bactrim [sulfamethoxazole-trimethoprim], Calcium, Calcium-containing compounds, Penicillin g, and Penicillins  No family history on file.  Social History Social History   Tobacco Use  . Smoking status: Never Smoker  . Smokeless tobacco: Current User    Types: Snuff  Substance Use Topics  . Alcohol use: No  . Drug use: No    Review of Systems  Review of Systems  Constitutional: Negative for chills, fatigue and fever.  HENT: Negative for sore throat.   Respiratory: Negative for shortness of breath.   Cardiovascular: Negative for chest pain.  Gastrointestinal: Positive for abdominal pain.  Genitourinary: Positive for flank pain.  Musculoskeletal:  Negative for neck pain.  Skin: Negative for rash and wound.  Allergic/Immunologic: Negative for immunocompromised state.  Neurological: Negative for weakness and numbness.  Hematological: Does not bruise/bleed easily.  All other systems reviewed and are negative.    ____________________________________________  PHYSICAL EXAM:      VITAL SIGNS: ED Triage Vitals [07/09/19 1709]  Enc Vitals Group     BP (!) 184/96     Pulse Rate 62     Resp 20     Temp 98.7 F (37.1 C)     Temp Source Oral     SpO2 100 %     Weight 145 lb (65.8 kg)     Height 5\' 10"  (1.778 m)     Head Circumference      Peak Flow      Pain Score 6     Pain Loc      Pain Edu?      Excl. in [place]?      Physical Exam Vitals and nursing note reviewed.  Constitutional:      General: He is not in acute distress.    Appearance: He is well-developed.  HENT:     Head: Normocephalic and atraumatic.  Eyes:     Conjunctiva/sclera: Conjunctivae normal.  Cardiovascular:     Rate and Rhythm: Normal rate and regular rhythm.     Heart sounds: Normal heart sounds. No murmur. No friction rub.  Pulmonary:     Effort: Pulmonary effort is normal. No respiratory distress.     Breath sounds: Normal breath sounds. No wheezing or rales.  Abdominal:     General: There is no distension.     Palpations: Abdomen is soft.     Tenderness: There is no abdominal tenderness.     Comments: Moderate left indirect inguinal hernia is appreciated upon any Valsalva maneuver.  Palpation in the area of the hernia reproduces his abdominal pain.  No CVA tenderness.  Genitourinary:    Comments: Testes are descended bilaterally.  Normal cremasterics.  Left inguinal hernia. Musculoskeletal:     Cervical back: Neck supple.  Skin:    General: Skin is warm.     Capillary Refill: Capillary refill takes less than 2 seconds.  Neurological:     Mental Status: He is alert and oriented to person, place, and time.     Motor: No abnormal muscle tone.        ____________________________________________   LABS (all labs ordered are listed, but only abnormal results are displayed)  Labs Reviewed  URINALYSIS, COMPLETE (UACMP) WITH MICROSCOPIC - Abnormal; Notable for the following components:      Result Value   Color, Urine STRAW (*)    APPearance CLEAR (*)    All other components within normal limits  BASIC METABOLIC PANEL - Abnormal; Notable for the following components:   Creatinine, Ser 2.72 (*)  GFR calc non Af Amer 26 (*)    GFR calc Af Amer 30 (*)    All other components within normal limits  CBC - Abnormal; Notable for the following components:   Hemoglobin 12.0 (*)    HCT 36.8 (*)    All other components within normal limits    ____________________________________________  EKG:  ________________________________________  RADIOLOGY All imaging, including plain films, CT scans, and ultrasounds, independently reviewed by me, and interpretations confirmed via formal radiology reads.  ED MD interpretation:   CT stone: No acute abnormality, bilateral kidney stones noted, no acute inflammatory process, possible indirect inguinal hernias  Official radiology report(s): CT Renal Stone Study  Result Date: 07/09/2019 CLINICAL DATA:  54 year old male with flank pain for 2 days radiating to the groin. EXAM: CT ABDOMEN AND PELVIS WITHOUT CONTRAST TECHNIQUE: Multidetector CT imaging of the abdomen and pelvis was performed following the standard protocol without IV contrast. COMPARISON:  CT Abdomen and Pelvis 03/25/2018. FINDINGS: Lower chest: Stable borderline to mild cardiomegaly. No pericardial effusion. Negative lung bases. Hepatobiliary: Chronic polycystic liver disease appears stable since 2019. The largest low-density areas have simple fluid density. Diminutive and negative gallbladder. Pancreas: Negative noncontrast pancreas. Spleen: Negative. Adrenals/Urinary Tract: Chronic polycystic renal disease. There are several small  but hyperdense/hemorrhagic left renal cysts now (such as at the upper pole series 2, image 16, and lower pole image 31). There is a 3 mm calculus in the left mid to lower pole. But no left hydronephrosis or perinephric stranding is evident. Contralateral right renal midpole 3 mm calculus. No right hydronephrosis or perinephric stranding. Negative adrenal glands. No hydroureter is evident. Unremarkable urinary bladder. Stomach/Bowel: Gas-filled but negative rectum. Decompressed sigmoid colon and left colon with some retained stool. Mild diverticulosis at the splenic flexure, no active inflammation (series 2, image 21). Larger volume of retained stool in redundant transverse colon, and the right colon. Normal gas-filled appendix on series 2, image 43. Frequently fluid-filled but nondilated small bowel. Bilateral lower quadrant small bowel loops are in proximity to the inguinal canals, but without overt inguinal hernias (series 2, image 62). Grossly negative noncontrast stomach. No free air or free fluid identified. Vascular/Lymphatic: Vascular patency is not evaluated in the absence of IV contrast. No atherosclerosis identified. No lymphadenopathy is evident. Reproductive: Negative. Other: No pelvic free fluid. Musculoskeletal: Mild chronic scoliosis. Advanced chronic lower lumbar disc degeneration, increased vacuum disc since 2019. No acute osseous abnormality identified. IMPRESSION: 1. Chronic polycystic kidney and liver disease. Mild bilateral nephrolithiasis with no evidence of obstructive uropathy. 2. No acute or inflammatory process identified in the noncontrast abdomen or pelvis. Normal appendix. Electronically Signed   By: Odessa Fleming M.D.   On: 07/09/2019 19:16    ____________________________________________  PROCEDURES   Procedure(s) performed (including Critical Care):  Procedures  ____________________________________________  INITIAL IMPRESSION / MDM / ASSESSMENT AND PLAN / ED COURSE  As part  of my medical decision making, I reviewed the following data within the electronic MEDICAL RECORD NUMBER Nursing notes reviewed and incorporated, Old chart reviewed, Notes from prior ED visits, and [place] Controlled Substance Database       *Dinh Ayotte was evaluated in Emergency Department on 07/09/2019 for the symptoms described in the history of present illness. He was evaluated in the context of the global COVID-19 pandemic, which necessitated consideration that the patient might be at risk for infection with the SARS-CoV-2 virus that causes COVID-19. Institutional protocols and algorithms that pertain to the evaluation of patients at risk  for COVID-19 are in a state of rapid change based on information released by regulatory bodies including the CDC and federal and state organizations. These policies and algorithms were followed during the patient's care in the ED.  Some ED evaluations and interventions may be delayed as a result of limited staffing during the pandemic.*     Medical Decision Making: 54 year old male here with left lower abdominal pain.  He has a history of recurrent kidney stones, and his pain certainly could be related to a recently passed stone.  He has no evidence to suggest obstruction at this time.  Renal function is at baseline.  Urinalysis is without evidence of UTI or infection.  However, I suspect his pain is more so actually related to a small, reproducible but painful indirect left inguinal hernia.  He has loops of bowel tightly adhered here on CT scan, and upon Valsalva maneuver I am able to reproduce his pain as well as palpate a hernia.  He seems to have reproduction with palpation.  He has no evidence of incarceration or strangulation.  Testes are normal and descended bilaterally.  Will treat with brief course of analgesics, stool softeners, and refer him to surgery.  ____________________________________________  FINAL CLINICAL IMPRESSION(S) / ED DIAGNOSES  Final  diagnoses:  Indirect inguinal hernia  Flank pain     MEDICATIONS GIVEN DURING THIS VISIT:  Medications  ketorolac (TORADOL) 30 MG/ML injection 15 mg (15 mg Intravenous Given 07/09/19 1911)  morphine 4 MG/ML injection 4 mg (4 mg Intravenous Given 07/09/19 1911)  ondansetron (ZOFRAN) injection 4 mg (4 mg Intravenous Given 07/09/19 1909)  sodium chloride 0.9 % bolus 1,000 mL (0 mLs Intravenous Stopped 07/09/19 2012)  HYDROcodone-acetaminophen (NORCO/VICODIN) 5-325 MG per tablet 2 tablet (2 tablets Oral Given 07/09/19 2029)     ED Discharge Orders         Ordered    HYDROcodone-acetaminophen (NORCO/VICODIN) 5-325 MG tablet  Every 6 hours PRN     07/09/19 2024    ondansetron (ZOFRAN ODT) 4 MG disintegrating tablet  Every 8 hours PRN     07/09/19 2024    docusate sodium (COLACE) 100 MG capsule  Daily     07/09/19 2024           Note:  This document was prepared using Dragon voice recognition software and may include unintentional dictation errors.   Shaune Pollack, MD 07/09/19 2155

## 2019-07-09 NOTE — ED Triage Notes (Signed)
Pt states that he has a kidney stone on the left side. Pt reports hx of the same. Pt reports sx;s started Monday and radiates down to his groin. Pt reports has been trying to flush it with no luck.

## 2019-07-09 NOTE — ED Notes (Signed)
Patient transported to CT 

## 2019-07-09 NOTE — ED Notes (Signed)
EDP at bedside  

## 2019-07-15 ENCOUNTER — Ambulatory Visit (INDEPENDENT_AMBULATORY_CARE_PROVIDER_SITE_OTHER): Payer: Commercial Managed Care - PPO | Admitting: General Surgery

## 2019-07-15 ENCOUNTER — Encounter: Payer: Self-pay | Admitting: General Surgery

## 2019-07-15 ENCOUNTER — Other Ambulatory Visit: Payer: Self-pay

## 2019-07-15 VITALS — BP 189/105 | HR 62 | Temp 97.9°F | Ht 70.0 in | Wt 145.2 lb

## 2019-07-15 DIAGNOSIS — K4021 Bilateral inguinal hernia, without obstruction or gangrene, recurrent: Secondary | ICD-10-CM | POA: Diagnosis not present

## 2019-07-15 NOTE — Patient Instructions (Addendum)
One of our surgery schedulers Britta Mccreedy or Kennyth Arnold will contact you within the next 24-48 hours. During that call, they will discuss with you the preparation prior to surgery and they will also discuss the different dates and times to get scheduled. Please have the BLUE sheet available when they contact you. If you have any questions or concerns, please give our office a call. Dr.Cannon discussed with patient the recovery stage is 6 weeks of no heavy lifting of 20 pounds or more.  Inguinal Hernia, Adult An inguinal hernia develops when fat or the intestines push through a weak spot in a muscle where your leg meets your lower abdomen (groin). This creates a bulge. This kind of hernia could also be:  In your scrotum, if you are male.  In folds of skin around your vagina, if you are male. There are three types of inguinal hernias:  Hernias that can be pushed back into the abdomen (are reducible). This type rarely causes pain.  Hernias that are not reducible (are incarcerated).  Hernias that are not reducible and lose their blood supply (are strangulated). This type of hernia requires emergency surgery. What are the causes? This condition is caused by having a weak spot in the muscles or tissues in the groin. This weak spot develops over time. The hernia may poke through the weak spot when you suddenly strain your lower abdominal muscles, such as when you:  Lift a heavy object.  Strain to have a bowel movement. Constipation can lead to straining.  Cough. What increases the risk? This condition is more likely to develop in:  Men.  Pregnant women.  People who: ? Are overweight. ? Work in jobs that require long periods of standing or heavy lifting. ? Have had an inguinal hernia before. ? Smoke or have lung disease. These factors can lead to long-lasting (chronic) coughing. What are the signs or symptoms? Symptoms may depend on the size of the hernia. Often, a small inguinal hernia has no  symptoms. Symptoms of a larger hernia may include:  A lump in the groin area. This is easier to see when standing. It might not be visible when lying down.  Pain or burning in the groin. This may get worse when lifting, straining, or coughing.  A dull ache or a feeling of pressure in the groin.  In men, an unusual lump in the scrotum. Symptoms of a strangulated inguinal hernia may include:  A bulge in your groin that is very painful and tender to the touch.  A bulge that turns red or purple.  Fever, nausea, and vomiting.  Inability to have a bowel movement or to pass gas. How is this diagnosed? This condition is diagnosed based on your symptoms, your medical history, and a physical exam. Your health care provider may feel your groin area and ask you to cough. How is this treated? Treatment depends on the size of your hernia and whether you have symptoms. If you do not have symptoms, your health care provider may have you watch your hernia carefully and have you come in for follow-up visits. If your hernia is large or if you have symptoms, you may need surgery to repair the hernia. Follow these instructions at home: Lifestyle  Avoid lifting heavy objects.  Avoid standing for long periods of time.  Do not use any products that contain nicotine or tobacco, such as cigarettes and e-cigarettes. If you need help quitting, ask your health care provider.  Maintain a healthy weight. Preventing  constipation  Take actions to prevent constipation. Constipation leads to straining with bowel movements, which can make a hernia worse or cause a hernia repair to break down. Your health care provider may recommend that you: ? Drink enough fluid to keep your urine pale yellow. ? Eat foods that are high in fiber, such as fresh fruits and vegetables, whole grains, and beans. ? Limit foods that are high in fat and processed sugars, such as fried or sweet foods. ? Take an over-the-counter or  prescription medicine for constipation. General instructions  You may try to push the hernia back in place by very gently pressing on it while lying down. Do not try to force the bulge back in if it will not push in easily.  Watch your hernia for any changes in shape, size, or color. Get help right away if you notice any changes.  Take over-the-counter and prescription medicines only as told by your health care provider.  Keep all follow-up visits as told by your health care provider. This is important. Contact a health care provider if:  You have a fever.  You develop new symptoms.  Your symptoms get worse. Get help right away if:  You have pain in your groin that suddenly gets worse.  You have a bulge in your groin that: ? Suddenly gets bigger and does not get smaller. ? Becomes red or purple or painful to the touch.  You are a man and you have a sudden pain in your scrotum, or the size of your scrotum suddenly changes.  You cannot push the hernia back in place by very gently pressing on it when you are lying down. Do not try to force the bulge back in if it will not push in easily.  You have nausea or vomiting that does not go away.  You have a fast heartbeat.  You cannot have a bowel movement or pass gas. These symptoms may represent a serious problem that is an emergency. Do not wait to see if the symptoms will go away. Get medical help right away. Call your local emergency services (911 in the [place] Summary  An inguinal hernia develops when fat or the intestines push through a weak spot in a muscle where your leg meets your lower abdomen (groin).  This condition is caused by having a weak spot in muscles or tissue in your groin.  Symptoms may depend on the size of the hernia, and they may include pain or swelling in your groin. A small inguinal hernia often has no symptoms.  Treatment may not be needed if you do not have symptoms. If you have symptoms or a large  hernia, you may need surgery to repair the hernia.  Avoid lifting heavy objects. Also avoid standing for long amounts of time. This information is not intended to replace advice given to you by your health care provider. Make sure you discuss any questions you have with your health care provider. Document Revised: 04/14/2017 Document Reviewed: 12/13/2016 Elsevier Patient Education  [place].  Flank Pain, Adult Flank pain is pain that is located on the side of the body between the upper abdomen and the back. This area is called the flank. The pain may occur over a short period of time (acute), or it may be long-term or recurring (chronic). It may be mild or severe. Flank pain can be caused by many things, including:  Muscle soreness or injury.  Kidney stones or kidney disease.  Stress.  A  disease of the spine (vertebral disk disease).  A lung infection (pneumonia).  Fluid around the lungs (pulmonary edema).  A skin rash caused by the chickenpox virus (shingles).  Tumors that affect the back of the abdomen.  Gallbladder disease. Follow these instructions at home:   Drink enough fluid to keep your urine clear or pale yellow.  Rest as told by your health care provider.  Take over-the-counter and prescription medicines only as told by your health care provider.  Keep a journal to track what has caused your flank pain and what has made it feel better.  Keep all follow-up visits as told by your health care provider. This is important. Contact a health care provider if:  Your pain is not controlled with medicine.  You have new symptoms.  Your pain gets worse.  You have a fever.  Your symptoms last longer than 2-3 days.  You have trouble urinating or you are urinating very frequently. Get help right away if:  You have trouble breathing or you are short of breath.  Your abdomen hurts or it is swollen or red.  You have nausea or vomiting.  You feel faint or  you pass out.  You have blood in your urine. Summary  Flank pain is pain that is located on the side of the body between the upper abdomen and the back.  The pain may occur over a short period of time (acute), or it may be long-term or recurring (chronic). It may be mild or severe.  Flank pain can be caused by many things.  Contact your health care provider if your symptoms get worse or they last longer than 2-3 days. This information is not intended to replace advice given to you by your health care provider. Make sure you discuss any questions you have with your health care provider. Document Revised: 02/23/2017 Document Reviewed: 05/26/2016 Elsevier Patient Education  2020 [employer].

## 2019-07-15 NOTE — Progress Notes (Signed)
Patient ID: John Gregory, male   DOB: 02/26/1966, 53 y.o.   MRN: [id number]  Chief Complaint  Patient presents with  . New Patient (Initial Visit)    new patient/ hospital f/u  Indirect inguinal hernia Flank pain    HPI John Gregory is a 53 y.o. male.   He is here today as a referral from the emergency department.  He was seen there on 14 April 2021with left flank pain.  He has a history of recurrent flank pain and kidney stones.  At the time of his emergency department visit, he reported that his pain  localized to the left lower quadrant and occasionally radiates towards his left lower flank.  It is reproducible with movement and seems to be worse with certain positions.  He has had some mild nausea but no vomiting.  Symptoms feel very similar to his previous kidney stones.  The pain does radiate down towards his left groin.  He denies any actual testicular pain itself.  He does note some occasional bulging in his left upper inguinal area.  He states that last night, he noticed some discomfort in his right groin as well.  He endorses constipation and does experience nausea when the pain is worse.  He says the pain is an aching sensation for the most part but occasionally becomes somewhat stabbing.  It does not seem to improve with anything aside from not moving.  Increased activity makes it worse.   Past Medical History:  Diagnosis Date  . Arthritis    hands, shoulders  . Hypertension   . Kidney stone   . Kidney stone   . Kidney stones   . Polycystic kidney disease   . Polycystic liver disease   . Renal disorder   . Wears contact lenses   . Wears dentures    partial upper and lower    Past Surgical History:  Procedure Laterality Date  . ANKLE SURGERY Left   . EXCISION MASS NECK Left 10/31/2017   Procedure: EXCISION MASS NECK;  Surgeon: Vaught, Creighton, MD;  Location: [facility];  Service: ENT;  Laterality: Left;  . KNEE SURGERY Bilateral   . SHOULDER SURGERY  Left   . WRIST SURGERY Left     Family History  Problem Relation Age of Onset  . Polycystic kidney disease Mother   . Polycystic kidney disease Sister     Social History Social History   Tobacco Use  . Smoking status: Never Smoker  . Smokeless tobacco: Current User    Types: Snuff  Substance Use Topics  . Alcohol use: No  . Drug use: No    Allergies  Allergen Reactions  . Bactrim [Sulfamethoxazole-Trimethoprim] Itching  . Calcium Other (See Comments)    Kidney stones  . Calcium-Containing Compounds     Causes kidney stones  . Penicillin G Swelling  . Penicillins Swelling    angioedema    Current Outpatient Medications  Medication Sig Dispense Refill  . amLODipine (NORVASC) 10 MG tablet Take 10 mg by mouth daily.    . benazepril-hydrochlorthiazide (LOTENSIN HCT) 20-12.5 MG tablet Take 2 tablets by mouth.     . docusate sodium (COLACE) 100 MG capsule Take 1 capsule (100 mg total) by mouth daily. 30 capsule 0  . HYDROcodone-acetaminophen (NORCO/VICODIN) 5-325 MG tablet Take 1-2 tablets by mouth every 6 (six) hours as needed for moderate pain or severe pain. 15 tablet 0  . LUMIGAN 0.01 % SOLN     . ondansetron (ZOFRAN   ODT) 4 MG disintegrating tablet Take 1 tablet (4 mg total) by mouth every 8 (eight) hours as needed for nausea or vomiting. 20 tablet 0  . aspirin EC 325 MG tablet Take 1 tablet (325 mg total) by mouth daily. (Patient not taking: Reported on 07/15/2019) 30 tablet 0  . cyclobenzaprine (FLEXERIL) 10 MG tablet Take 1 tablet (10 mg total) by mouth 3 (three) times daily as needed. (Patient not taking: Reported on 07/15/2019) 15 tablet 0  . escitalopram (LEXAPRO) 10 MG tablet Take 10 mg by mouth daily.    Marland Kitchen glycopyrrolate (ROBINUL) 1 MG tablet Take 1 mg by mouth 2 (two) times daily as needed.    Marland Kitchen ibuprofen (ADVIL) 600 MG tablet Take 1 tablet (600 mg total) by mouth every 8 (eight) hours as needed. (Patient not taking: Reported on 07/15/2019) 15 tablet 0  . traMADol  (ULTRAM) 50 MG tablet Take 1 tablet (50 mg total) by mouth every 6 (six) hours as needed for moderate pain. (Patient not taking: Reported on 07/15/2019) 12 tablet 0   No current facility-administered medications for this visit.    Review of Systems Review of Systems  Gastrointestinal: Positive for constipation.  Genitourinary: Positive for difficulty urinating, flank pain and testicular pain.  All other systems reviewed and are negative.  Today's Vitals   07/15/19 1417  BP: (!) 189/105  Pulse: 62  Temp: 97.9 F (36.6 C)  TempSrc: Temporal  SpO2: 97%  Weight: 145 lb 3.2 oz (65.9 kg)  Height: 5\' 10"  (1.778 m)  PainSc: 2   PainLoc: Scrotum   Body mass index is 20.83 kg/m.   Physical Exam Physical Exam Constitutional:      General: He is not in acute distress.    Appearance: He is normal weight.  HENT:     Head: Normocephalic and atraumatic.     Nose:     Comments: Covered with a mask secondary to COVID-19 precautions    Mouth/Throat:     Comments: Covered with a mask secondary to COVID-19 precautions Eyes:     General: No scleral icterus.       Right eye: No discharge.        Left eye: No discharge.     Conjunctiva/sclera: Conjunctivae normal.  Neck:     Comments: No palpable thyromegaly or dominant thyroid masses appreciated. Cardiovascular:     Rate and Rhythm: Normal rate and regular rhythm.     Pulses: Normal pulses.  Pulmonary:     Effort: Pulmonary effort is normal.     Breath sounds: Normal breath sounds.  Abdominal:     General: Bowel sounds are normal.     Palpations: Abdomen is soft.  Genitourinary:      Comments: Visible bulge in the left inguinal canal.  It reduces easily.  With Valsalva, there is a palpable hernia in the right inguinal canal as well. Musculoskeletal:        General: No swelling or tenderness.  Lymphadenopathy:     Cervical: No cervical adenopathy.  Skin:    General: Skin is warm and dry.  Neurological:     General: No focal  deficit present.     Mental Status: He is alert and oriented to person, place, and time.  Psychiatric:        Mood and Affect: Mood normal.        Behavior: Behavior normal.     Data Reviewed I reviewed the emergency department note from April 14.  This describes the physical  exam finding of a left inguinal hernia.  I also reviewed the imaging obtained that date.  The radiologist does not comment that visible hernias are present, but that there is a small bowel closely approximated to each inguinal canal.  Quite notable are the large cysts within the kidney and liver, consistent with his known history of polycystic kidney disease.  Assessment This is a 54 year old man with bilateral inguinal hernias.  They are both quite small but are starting to cause some symptoms.  He is interested in surgical repair.  I discussed with him the option of a referral to one of my partners who performs laparoscopic or robotic inguinal hernias, however he stated that he was perfectly happy to have me perform them open.  Plan I have explained the procedure, risks, and aftercare of inguinal hernia repair to Gita Kudo.   Risks include but are not limited to bleeding, infection, wound problems, anesthesia, recurrence, bladder or intestine injury, urinary retention, testicular dysfunction, chronic pain, mesh problems.  He  seems to understand and agrees to proceed.  Questions were answered to his stated satisfaction.  We will work on getting him scheduled.    Fredirick Maudlin 07/15/2019, 2:18 PM

## 2019-07-15 NOTE — H&P (View-Only) (Signed)
Patient ID: John Gregory, male   DOB: 1966/01/14, 54 y.o.   MRN: [id number]  Chief Complaint  Patient presents with  . New Patient (Initial Visit)    new patient/ hospital f/u  Indirect inguinal hernia Flank pain    HPI John Gregory is a 54 y.o. male.   He is here today as a referral from the emergency department.  He was seen there on 14 April 2021with left flank pain.  He has a history of recurrent flank pain and kidney stones.  At the time of his emergency department visit, he reported that his pain  localized to the left lower quadrant and occasionally radiates towards his left lower flank.  It is reproducible with movement and seems to be worse with certain positions.  He has had some mild nausea but no vomiting.  Symptoms feel very similar to his previous kidney stones.  The pain does radiate down towards his left groin.  He denies any actual testicular pain itself.  He does note some occasional bulging in his left upper inguinal area.  He states that last night, he noticed some discomfort in his right groin as well.  He endorses constipation and does experience nausea when the pain is worse.  He says the pain is an aching sensation for the most part but occasionally becomes somewhat stabbing.  It does not seem to improve with anything aside from not moving.  Increased activity makes it worse.   Past Medical History:  Diagnosis Date  . Arthritis    hands, shoulders  . Hypertension   . Kidney stone   . Kidney stone   . Kidney stones   . Polycystic kidney disease   . Polycystic liver disease   . Renal disorder   . Wears contact lenses   . Wears dentures    partial upper and lower    Past Surgical History:  Procedure Laterality Date  . ANKLE SURGERY Left   . EXCISION MASS NECK Left 10/31/2017   Procedure: EXCISION MASS NECK;  Surgeon: Bud Face, MD;  Location: [facility];  Service: ENT;  Laterality: Left;  . KNEE SURGERY Bilateral   . SHOULDER SURGERY  Left   . WRIST SURGERY Left     Family History  Problem Relation Age of Onset  . Polycystic kidney disease Mother   . Polycystic kidney disease Sister     Social History Social History   Tobacco Use  . Smoking status: Never Smoker  . Smokeless tobacco: Current User    Types: Snuff  Substance Use Topics  . Alcohol use: No  . Drug use: No    Allergies  Allergen Reactions  . Bactrim [Sulfamethoxazole-Trimethoprim] Itching  . Calcium Other (See Comments)    Kidney stones  . Calcium-Containing Compounds     Causes kidney stones  . Penicillin G Swelling  . Penicillins Swelling    angioedema    Current Outpatient Medications  Medication Sig Dispense Refill  . amLODipine (NORVASC) 10 MG tablet Take 10 mg by mouth daily.    . benazepril-hydrochlorthiazide (LOTENSIN HCT) 20-12.5 MG tablet Take 2 tablets by mouth.     . docusate sodium (COLACE) 100 MG capsule Take 1 capsule (100 mg total) by mouth daily. 30 capsule 0  . HYDROcodone-acetaminophen (NORCO/VICODIN) 5-325 MG tablet Take 1-2 tablets by mouth every 6 (six) hours as needed for moderate pain or severe pain. 15 tablet 0  . LUMIGAN 0.01 % SOLN     . ondansetron (ZOFRAN  ODT) 4 MG disintegrating tablet Take 1 tablet (4 mg total) by mouth every 8 (eight) hours as needed for nausea or vomiting. 20 tablet 0  . aspirin EC 325 MG tablet Take 1 tablet (325 mg total) by mouth daily. (Patient not taking: Reported on 07/15/2019) 30 tablet 0  . cyclobenzaprine (FLEXERIL) 10 MG tablet Take 1 tablet (10 mg total) by mouth 3 (three) times daily as needed. (Patient not taking: Reported on 07/15/2019) 15 tablet 0  . escitalopram (LEXAPRO) 10 MG tablet Take 10 mg by mouth daily.    Marland Kitchen glycopyrrolate (ROBINUL) 1 MG tablet Take 1 mg by mouth 2 (two) times daily as needed.    Marland Kitchen ibuprofen (ADVIL) 600 MG tablet Take 1 tablet (600 mg total) by mouth every 8 (eight) hours as needed. (Patient not taking: Reported on 07/15/2019) 15 tablet 0  . traMADol  (ULTRAM) 50 MG tablet Take 1 tablet (50 mg total) by mouth every 6 (six) hours as needed for moderate pain. (Patient not taking: Reported on 07/15/2019) 12 tablet 0   No current facility-administered medications for this visit.    Review of Systems Review of Systems  Gastrointestinal: Positive for constipation.  Genitourinary: Positive for difficulty urinating, flank pain and testicular pain.  All other systems reviewed and are negative.  Today's Vitals   07/15/19 1417  BP: (!) 189/105  Pulse: 62  Temp: 97.9 F (36.6 C)  TempSrc: Temporal  SpO2: 97%  Weight: 145 lb 3.2 oz (65.9 kg)  Height: 5\' 10"  (1.778 m)  PainSc: 2   PainLoc: Scrotum   Body mass index is 20.83 kg/m.   Physical Exam Physical Exam Constitutional:      General: He is not in acute distress.    Appearance: He is normal weight.  HENT:     Head: Normocephalic and atraumatic.     Nose:     Comments: Covered with a mask secondary to COVID-19 precautions    Mouth/Throat:     Comments: Covered with a mask secondary to COVID-19 precautions Eyes:     General: No scleral icterus.       Right eye: No discharge.        Left eye: No discharge.     Conjunctiva/sclera: Conjunctivae normal.  Neck:     Comments: No palpable thyromegaly or dominant thyroid masses appreciated. Cardiovascular:     Rate and Rhythm: Normal rate and regular rhythm.     Pulses: Normal pulses.  Pulmonary:     Effort: Pulmonary effort is normal.     Breath sounds: Normal breath sounds.  Abdominal:     General: Bowel sounds are normal.     Palpations: Abdomen is soft.  Genitourinary:      Comments: Visible bulge in the left inguinal canal.  It reduces easily.  With Valsalva, there is a palpable hernia in the right inguinal canal as well. Musculoskeletal:        General: No swelling or tenderness.  Lymphadenopathy:     Cervical: No cervical adenopathy.  Skin:    General: Skin is warm and dry.  Neurological:     General: No focal  deficit present.     Mental Status: He is alert and oriented to person, place, and time.  Psychiatric:        Mood and Affect: Mood normal.        Behavior: Behavior normal.     Data Reviewed I reviewed the emergency department note from April 14.  This describes the physical  exam finding of a left inguinal hernia.  I also reviewed the imaging obtained that date.  The radiologist does not comment that visible hernias are present, but that there is a small bowel closely approximated to each inguinal canal.  Quite notable are the large cysts within the kidney and liver, consistent with his known history of polycystic kidney disease.  Assessment This is a 54 year old man with bilateral inguinal hernias.  They are both quite small but are starting to cause some symptoms.  He is interested in surgical repair.  I discussed with him the option of a referral to one of my partners who performs laparoscopic or robotic inguinal hernias, however he stated that he was perfectly happy to have me perform them open.  Plan I have explained the procedure, risks, and aftercare of inguinal hernia repair to Gita Kudo.   Risks include but are not limited to bleeding, infection, wound problems, anesthesia, recurrence, bladder or intestine injury, urinary retention, testicular dysfunction, chronic pain, mesh problems.  He  seems to understand and agrees to proceed.  Questions were answered to his stated satisfaction.  We will work on getting him scheduled.    Fredirick Maudlin 07/15/2019, 2:18 PM

## 2019-07-16 ENCOUNTER — Telehealth: Payer: Self-pay | Admitting: General Surgery

## 2019-07-16 NOTE — Telephone Encounter (Signed)
Outgoing call, left message for patient to call.  Please advise him of Pre-Admission date/time, COVID Testing date and Surgery date.  Surgery Date: 07/25/19   Preadmission Testing Date: 07/22/19 (phone 8a-1p) Covid Testing Date: 07/23/19 - patient [facility] to [facility] to [facility] ([address] [facility]) between 8a-1p   Also patient is to call [phone], between 1-3:00pm the day before surgery, to find out what time to arrive for surgery.

## 2019-07-16 NOTE — Telephone Encounter (Signed)
Received call back from patient.  He is now aware of all dates regarding his surgery and voices understanding.

## 2019-07-22 ENCOUNTER — Encounter
Admission: RE | Admit: 2019-07-22 | Discharge: 2019-07-22 | Disposition: A | Discharge status: Home / Self Care | Payer: Commercial Managed Care - PPO | Source: Ambulatory Visit | Attending: General Surgery | Admitting: General Surgery

## 2019-07-22 ENCOUNTER — Other Ambulatory Visit: Payer: Commercial Managed Care - PPO

## 2019-07-22 ENCOUNTER — Other Ambulatory Visit: Payer: Self-pay | Admitting: General Surgery

## 2019-07-22 ENCOUNTER — Other Ambulatory Visit: Payer: Self-pay

## 2019-07-22 DIAGNOSIS — K4020 Bilateral inguinal hernia, without obstruction or gangrene, not specified as recurrent: Secondary | ICD-10-CM

## 2019-07-22 NOTE — Patient Instructions (Signed)
COVID TESTING Date: Testing site:  [facility] [address] Thru Hours:  4:25 am - 1:00 pm Once you are tested, you are asked to stay quarantined (avoiding public places) until after your surgery.   Your procedure is scheduled on: Report to Day Surgery on the 2nd floor of the [facility]. To find out your arrival time, please call [phone] between 1PM - 3PM on:  REMEMBER: Instructions that are not followed completely may result in serious medical risk, up to and including death; or upon the discretion of your surgeon and anesthesiologist your surgery may need to be rescheduled.  Do not eat food after midnight the night before surgery.  No gum chewing, lozengers or hard candies.  You may however, drink CLEAR liquids up to 2 hours before you are scheduled to arrive for your surgery. Do not drink anything within 2 hours of your scheduled arrival time.  Clear liquids include: - water  - apple juice without pulp - gatorade (not RED) - black coffee or tea (Do NOT add milk or creamers to the coffee or tea) Do NOT drink anything that is not on this list.  Type 1 and Type 2 diabetics should only drink water.  ENSURE PRE-SURGERY CARBOHYDRATE DRINK:  Complete drinking 2 hours prior to scheduled arrival time. IF A DRINK IS ORDERED.  TAKE THESE MEDICATIONS THE MORNING OF SURGERY WITH A SIP OF WATER: NONE  Stop Anti-inflammatories (NSAIDS) such as Advil, Aleve, Ibuprofen, Motrin, Naproxen, Naprosyn and ASPIRIN AND  Aspirin based products such as Excedrin, Goodys Powder, BC Powder. (May take Tylenol or Acetaminophen if needed.)  Stop ANY OVER THE COUNTER supplements until after surgery. (May continue Vitamin D, Vitamin B, and multivitamin.)  No Alcohol for 24 hours before or after surgery.  No Smoking including e-cigarettes for 24 hours prior to surgery.  No chewable tobacco products for at least 6 hours prior to surgery.  No nicotine patches on the  day of surgery.  Do not use any "recreational" drugs for at least a week prior to your surgery.  Please be advised that the combination of cocaine and anesthesia may have negative outcomes, up to and including death. If you test positive for cocaine, your surgery will be cancelled.  On the morning of surgery brush your teeth with toothpaste and water, you may rinse your mouth with mouthwash if you wish. Do not swallow any toothpaste or mouthwash.  Do not wear jewelry, make-up, hairpins, clips or nail polish.  Do not wear lotions, powders, or perfumes AFTERSHAVE AND DEODORANT  Do not shave 48 hours prior to surgery.   Contact lenses, hearing aids and dentures may not be worn into surgery.  Do not bring valuables to the hospital. [facility] is not responsible for any missing/lost belongings or valuables.   Use CHG Soap  as directed on instruction sheet.  Notify your doctor if there is any change in your medical condition (cold, fever, infection).  Wear comfortable clothing (specific to your surgery type) to the hospital.  Plan for stool softeners for home use; pain medications have a tendency to cause constipation. You can also help prevent constipation by eating foods high in fiber such as fruits and vegetables and drinking plenty of fluids as your diet allows.  After surgery, you can help prevent lung complications by doing breathing exercises.  Take deep breaths and cough every 1-2 hours. Your doctor may order a device called an Incentive Spirometer to help you take deep  breaths. When coughing or sneezing, hold a pillow firmly against your incision with both hands. This is called "splinting." Doing this helps protect your incision. It also decreases belly discomfort.  If you are being discharged the day of surgery, you will not be allowed to drive home. You will need a responsible adult (18 years or older) to drive you home and stay with you that night.   If you are taking public  transportation, you will need to have a responsible adult (18 years or older) with you. Please confirm with your physician that it is acceptable to use public transportation.   Please call the Pre-admissions Testing Dept. at [phone] if you have any questions about these instructions.  Visitation Policy:  Patients undergoing a surgery or procedure may have one family member or support person with them as long as that person is not COVID-19 positive or experiencing its symptoms.  That person may remain in the waiting area during the procedure.  Children under 42 years of age may have both parents or legal guardians with them during their hospital stay.   Inpatient Visitation Update:  Two designated support people may visit a patient during visiting hours 7 am to 8 pm. It must be the same two designated people for the duration of the patient stay. The visitors may come and go during the day, and there is no switching out to have different visitors. A mask must be worn at all times, including in the patient room.

## 2019-07-23 ENCOUNTER — Encounter
Admission: RE | Admit: 2019-07-23 | Discharge: 2019-07-23 | Disposition: A | Discharge status: Home / Self Care | Payer: Commercial Managed Care - PPO | Source: Ambulatory Visit | Attending: General Surgery | Admitting: General Surgery

## 2019-07-23 DIAGNOSIS — I10 Essential (primary) hypertension: Secondary | ICD-10-CM | POA: Diagnosis not present

## 2019-07-23 DIAGNOSIS — Z01818 Encounter for other preprocedural examination: Secondary | ICD-10-CM | POA: Diagnosis present

## 2019-07-23 DIAGNOSIS — Z20822 Contact with and (suspected) exposure to covid-19: Secondary | ICD-10-CM | POA: Diagnosis not present

## 2019-07-23 LAB — SARS CORONAVIRUS 2 (TAT 6-24 HRS): SARS Coronavirus 2: NEGATIVE

## 2019-07-25 ENCOUNTER — Other Ambulatory Visit: Payer: Self-pay

## 2019-07-25 ENCOUNTER — Ambulatory Visit: Payer: Commercial Managed Care - PPO | Admitting: Anesthesiology

## 2019-07-25 ENCOUNTER — Ambulatory Visit
Admission: RE | Admit: 2019-07-25 | Discharge: 2019-07-25 | Disposition: A | Discharge status: Home / Self Care | Payer: Commercial Managed Care - PPO | Attending: General Surgery | Admitting: General Surgery

## 2019-07-25 ENCOUNTER — Encounter: Payer: Self-pay | Admitting: General Surgery

## 2019-07-25 ENCOUNTER — Encounter
Admission: RE | Disposition: A | Discharge status: Home / Self Care | Payer: Self-pay | Source: Home / Self Care | Attending: General Surgery

## 2019-07-25 DIAGNOSIS — M19042 Primary osteoarthritis, left hand: Secondary | ICD-10-CM | POA: Diagnosis not present

## 2019-07-25 DIAGNOSIS — Z79899 Other long term (current) drug therapy: Secondary | ICD-10-CM | POA: Insufficient documentation

## 2019-07-25 DIAGNOSIS — Z7982 Long term (current) use of aspirin: Secondary | ICD-10-CM | POA: Insufficient documentation

## 2019-07-25 DIAGNOSIS — Z888 Allergy status to other drugs, medicaments and biological substances status: Secondary | ICD-10-CM | POA: Diagnosis not present

## 2019-07-25 DIAGNOSIS — Z881 Allergy status to other antibiotic agents status: Secondary | ICD-10-CM | POA: Diagnosis not present

## 2019-07-25 DIAGNOSIS — Z841 Family history of disorders of kidney and ureter: Secondary | ICD-10-CM | POA: Diagnosis not present

## 2019-07-25 DIAGNOSIS — Z880 Allergy status to penicillin: Secondary | ICD-10-CM | POA: Insufficient documentation

## 2019-07-25 DIAGNOSIS — I10 Essential (primary) hypertension: Secondary | ICD-10-CM | POA: Insufficient documentation

## 2019-07-25 DIAGNOSIS — M19012 Primary osteoarthritis, left shoulder: Secondary | ICD-10-CM | POA: Insufficient documentation

## 2019-07-25 DIAGNOSIS — Z87442 Personal history of urinary calculi: Secondary | ICD-10-CM | POA: Insufficient documentation

## 2019-07-25 DIAGNOSIS — Z791 Long term (current) use of non-steroidal anti-inflammatories (NSAID): Secondary | ICD-10-CM | POA: Insufficient documentation

## 2019-07-25 DIAGNOSIS — K4020 Bilateral inguinal hernia, without obstruction or gangrene, not specified as recurrent: Secondary | ICD-10-CM

## 2019-07-25 DIAGNOSIS — M19011 Primary osteoarthritis, right shoulder: Secondary | ICD-10-CM | POA: Insufficient documentation

## 2019-07-25 DIAGNOSIS — M19041 Primary osteoarthritis, right hand: Secondary | ICD-10-CM | POA: Diagnosis not present

## 2019-07-25 DIAGNOSIS — K5900 Constipation, unspecified: Secondary | ICD-10-CM | POA: Diagnosis not present

## 2019-07-25 HISTORY — PX: INGUINAL HERNIA REPAIR: SHX194

## 2019-07-25 SURGERY — REPAIR, HERNIA, INGUINAL, ADULT
Anesthesia: General | Laterality: Bilateral

## 2019-07-25 MED ORDER — LACTATED RINGERS IV SOLN: INTRAVENOUS | Status: DC

## 2019-07-25 MED ORDER — GABAPENTIN 300 MG PO CAPS
ORAL_CAPSULE | ORAL | Status: AC
  Administered 2019-07-25: 300 mg via ORAL
  Filled 2019-07-25: qty 1

## 2019-07-25 MED ORDER — LIDOCAINE HCL (CARDIAC) PF 100 MG/5ML IV SOSY
PREFILLED_SYRINGE | INTRAVENOUS | Status: DC | PRN
  Administered 2019-07-25: 100 mg via INTRAVENOUS

## 2019-07-25 MED ORDER — BUPIVACAINE LIPOSOME 1.3 % IJ SUSP: 20.0000 mL | Freq: Once | INTRAMUSCULAR | Status: DC

## 2019-07-25 MED ORDER — CLINDAMYCIN PHOSPHATE 900 MG/50ML IV SOLN
INTRAVENOUS | Status: AC
  Filled 2019-07-25: qty 50

## 2019-07-25 MED ORDER — MIDAZOLAM HCL 2 MG/2ML IJ SOLN
INTRAMUSCULAR | Status: DC | PRN
  Administered 2019-07-25: 2 mg via INTRAVENOUS

## 2019-07-25 MED ORDER — FENTANYL CITRATE (PF) 100 MCG/2ML IJ SOLN
INTRAMUSCULAR | Status: AC
  Filled 2019-07-25: qty 2

## 2019-07-25 MED ORDER — BUPIVACAINE LIPOSOME 1.3 % IJ SUSP
INTRAMUSCULAR | Status: AC
  Filled 2019-07-25: qty 20

## 2019-07-25 MED ORDER — PROPOFOL 10 MG/ML IV BOLUS
INTRAVENOUS | Status: DC | PRN
  Administered 2019-07-25: 120 mg via INTRAVENOUS

## 2019-07-25 MED ORDER — ACETAMINOPHEN 500 MG PO TABS
ORAL_TABLET | ORAL | Status: AC
  Administered 2019-07-25: 09:00:00 1000 mg via ORAL
  Filled 2019-07-25: qty 2

## 2019-07-25 MED ORDER — BUPIVACAINE HCL (PF) 0.25 % IJ SOLN
INTRAMUSCULAR | Status: AC
  Filled 2019-07-25: qty 30

## 2019-07-25 MED ORDER — SUGAMMADEX SODIUM 200 MG/2ML IV SOLN
INTRAVENOUS | Status: DC | PRN
  Administered 2019-07-25: 400 mg via INTRAVENOUS

## 2019-07-25 MED ORDER — FAMOTIDINE 20 MG PO TABS: 20.0000 mg | ORAL_TABLET | Freq: Once | ORAL | Status: AC

## 2019-07-25 MED ORDER — CHLORHEXIDINE GLUCONATE CLOTH 2 % EX PADS: 6.0000 | MEDICATED_PAD | Freq: Once | CUTANEOUS | Status: DC

## 2019-07-25 MED ORDER — ONDANSETRON HCL 4 MG/2ML IJ SOLN
INTRAMUSCULAR | Status: DC | PRN
  Administered 2019-07-25: 4 mg via INTRAVENOUS

## 2019-07-25 MED ORDER — GABAPENTIN 300 MG PO CAPS: 300.0000 mg | ORAL_CAPSULE | ORAL | Status: AC

## 2019-07-25 MED ORDER — ROCURONIUM BROMIDE 100 MG/10ML IV SOLN
INTRAVENOUS | Status: DC | PRN
  Administered 2019-07-25: 10 mg via INTRAVENOUS
  Administered 2019-07-25: 20 mg via INTRAVENOUS
  Administered 2019-07-25: 40 mg via INTRAVENOUS

## 2019-07-25 MED ORDER — OXYCODONE HCL 5 MG PO TABS: 5.0000 mg | ORAL_TABLET | Freq: Four times a day (QID) | ORAL | 0 refills | Status: DC | PRN

## 2019-07-25 MED ORDER — PHENYLEPHRINE HCL (PRESSORS) 10 MG/ML IV SOLN
INTRAVENOUS | Status: DC | PRN
  Administered 2019-07-25 (×3): 100 ug via INTRAVENOUS

## 2019-07-25 MED ORDER — IBUPROFEN 800 MG PO TABS
800.0000 mg | ORAL_TABLET | Freq: Three times a day (TID) | ORAL | 0 refills | Status: DC | PRN
Start: 2019-07-25 — End: 2019-07-25

## 2019-07-25 MED ORDER — PHENYLEPHRINE HCL-NACL 20-0.9 MG/250ML-% IV SOLN
INTRAVENOUS | Status: DC | PRN
  Administered 2019-07-25: 50 ug/min via INTRAVENOUS

## 2019-07-25 MED ORDER — LIDOCAINE-EPINEPHRINE 1 %-1:100000 IJ SOLN
INTRAMUSCULAR | Status: DC | PRN
  Administered 2019-07-25 (×2): 5 mL

## 2019-07-25 MED ORDER — SCOPOLAMINE 1 MG/3DAYS TD PT72
MEDICATED_PATCH | TRANSDERMAL | Status: AC
  Filled 2019-07-25: qty 1

## 2019-07-25 MED ORDER — ACETAMINOPHEN 500 MG PO TABS
1000.0000 mg | ORAL_TABLET | Freq: Four times a day (QID) | ORAL | 0 refills | Status: DC | PRN
Start: 2019-07-25 — End: 2021-05-24

## 2019-07-25 MED ORDER — SCOPOLAMINE 1 MG/3DAYS TD PT72
1.0000 | MEDICATED_PATCH | TRANSDERMAL | Status: DC
  Administered 2019-07-25: 1.5 mg via TRANSDERMAL

## 2019-07-25 MED ORDER — ONDANSETRON HCL 4 MG/2ML IJ SOLN: 4.0000 mg | Freq: Once | INTRAMUSCULAR | Status: DC | PRN

## 2019-07-25 MED ORDER — FAMOTIDINE 20 MG PO TABS
ORAL_TABLET | ORAL | Status: AC
  Administered 2019-07-25: 20 mg via ORAL
  Filled 2019-07-25: qty 1

## 2019-07-25 MED ORDER — FENTANYL CITRATE (PF) 100 MCG/2ML IJ SOLN: 25.0000 ug | INTRAMUSCULAR | Status: DC | PRN

## 2019-07-25 MED ORDER — FENTANYL CITRATE (PF) 100 MCG/2ML IJ SOLN
INTRAMUSCULAR | Status: DC | PRN
  Administered 2019-07-25 (×2): 50 ug via INTRAVENOUS

## 2019-07-25 MED ORDER — ACETAMINOPHEN 500 MG PO TABS: 1000.0000 mg | ORAL_TABLET | ORAL | Status: AC

## 2019-07-25 MED ORDER — LIDOCAINE-EPINEPHRINE 1 %-1:100000 IJ SOLN
INTRAMUSCULAR | Status: AC
  Filled 2019-07-25: qty 1

## 2019-07-25 MED ORDER — BUPIVACAINE HCL (PF) 0.25 % IJ SOLN
INTRAMUSCULAR | Status: DC | PRN
  Administered 2019-07-25 (×2): 10 mL

## 2019-07-25 MED ORDER — VASOPRESSIN 20 UNIT/ML IV SOLN
INTRAVENOUS | Status: DC | PRN
Start: 2019-07-25 — End: 2019-07-25
  Administered 2019-07-25 (×2): 1 [IU] via INTRAVENOUS

## 2019-07-25 MED ORDER — LACTATED RINGERS IV SOLN: INTRAVENOUS | Status: DC | PRN

## 2019-07-25 MED ORDER — CLINDAMYCIN PHOSPHATE 900 MG/50ML IV SOLN
900.0000 mg | INTRAVENOUS | Status: AC
  Administered 2019-07-25: 900 mg via INTRAVENOUS

## 2019-07-25 MED ORDER — DEXAMETHASONE SODIUM PHOSPHATE 10 MG/ML IJ SOLN
INTRAMUSCULAR | Status: DC | PRN
  Administered 2019-07-25: 10 mg via INTRAVENOUS

## 2019-07-25 MED ORDER — BUPIVACAINE LIPOSOME 1.3 % IJ SUSP
INTRAMUSCULAR | Status: DC | PRN
  Administered 2019-07-25 (×2): 10 mL

## 2019-07-25 MED ORDER — MIDAZOLAM HCL 2 MG/2ML IJ SOLN
INTRAMUSCULAR | Status: AC
  Filled 2019-07-25: qty 2

## 2019-07-25 MED ORDER — ONDANSETRON 4 MG PO TBDP
4.0000 mg | ORAL_TABLET | Freq: Three times a day (TID) | ORAL | 0 refills | Status: DC | PRN
Start: 2019-07-25 — End: 2020-11-07

## 2019-07-25 SURGICAL SUPPLY — 38 items
BLADE CLIPPER SURG (BLADE) ×2 IMPLANT
CANISTER SUCT 1200ML W/VALVE (MISCELLANEOUS) ×2 IMPLANT
CHLORAPREP W/TINT 26 (MISCELLANEOUS) ×2 IMPLANT
COVER WAND RF STERILE (DRAPES) ×2 IMPLANT
DERMABOND ADVANCED (GAUZE/BANDAGES/DRESSINGS) ×2
DERMABOND ADVANCED .7 DNX12 (GAUZE/BANDAGES/DRESSINGS) ×2 IMPLANT
DRAIN PENROSE 5/8X18 LTX STRL (DRAIN) ×2 IMPLANT
DRAPE LAPAROTOMY 77X122 PED (DRAPES) ×2 IMPLANT
ELECT CAUTERY BLADE TIP 2.5 (TIP) ×2
ELECT REM PT RETURN 9FT ADLT (ELECTROSURGICAL) ×2
ELECTRODE CAUTERY BLDE TIP 2.5 (TIP) ×1 IMPLANT
ELECTRODE REM PT RTRN 9FT ADLT (ELECTROSURGICAL) ×1 IMPLANT
GLOVE BIO SURGEON STRL SZ 6.5 (GLOVE) ×4 IMPLANT
GLOVE INDICATOR 7.0 STRL GRN (GLOVE) ×6 IMPLANT
GOWN STRL REUS W/ TWL LRG LVL3 (GOWN DISPOSABLE) ×3 IMPLANT
GOWN STRL REUS W/TWL LRG LVL3 (GOWN DISPOSABLE) ×3
KIT TURNOVER KIT A (KITS) ×2 IMPLANT
LABEL OR SOLS (LABEL) IMPLANT
MESH HERNIA 6X12 ULTRAPRO MED (Mesh General) ×2 IMPLANT
MESH HERNIA ULTRAPRO MED (Mesh General) ×2 IMPLANT
NEEDLE HYPO 22GX1.5 SAFETY (NEEDLE) ×4 IMPLANT
NS IRRIG 500ML POUR BTL (IV SOLUTION) ×2 IMPLANT
PACK BASIN MINOR (MISCELLANEOUS) ×2 IMPLANT
SPONGE KITTNER 5P (MISCELLANEOUS) ×2 IMPLANT
SPONGE LAP 18X18 RF (DISPOSABLE) ×2 IMPLANT
STRIP CLOSURE SKIN 1/2X4 (GAUZE/BANDAGES/DRESSINGS) ×2 IMPLANT
SUT ETHIBOND NAB MO 7 #0 18IN (SUTURE) ×6 IMPLANT
SUT MNCRL 4-0 (SUTURE) ×1
SUT MNCRL 4-0 27XMFL (SUTURE) ×1
SUT PROLENE 2 0 SH DA (SUTURE) IMPLANT
SUT VIC AB 2-0 SH 27 (SUTURE) ×1
SUT VIC AB 2-0 SH 27XBRD (SUTURE) ×1 IMPLANT
SUT VIC AB 3-0 SH 27 (SUTURE)
SUT VIC AB 3-0 SH 27X BRD (SUTURE) IMPLANT
SUTURE MNCRL 4-0 27XMF (SUTURE) ×1 IMPLANT
SYR 10ML LL (SYRINGE) ×2 IMPLANT
SYR 20ML LL LF (SYRINGE) ×2 IMPLANT
SYR BULB IRRIG 60ML STRL (SYRINGE) ×2 IMPLANT

## 2019-07-25 NOTE — Interval H&P Note (Signed)
History and Physical Interval Note:  07/25/2019 9:21 AM  John Gregory  has presented today for surgery, with the diagnosis of Bilateral inguinal hernias.  The various methods of treatment have been discussed with the patient and family. After consideration of risks, benefits and other options for treatment, the patient has consented to  Procedure(s): HERNIA REPAIR INGUINAL ADULT (Bilateral) as a surgical intervention.  The patient's history has been reviewed, patient examined, no change in status, stable for surgery.  I have reviewed the patient's chart and labs.  Questions were answered to the patient's satisfaction.     Duanne Guess

## 2019-07-25 NOTE — Transfer of Care (Signed)
Immediate Anesthesia Transfer of Care Note  Patient: John Gregory  Procedure(s) Performed: HERNIA REPAIR INGUINAL ADULT (Bilateral )  Patient Location: PACU  Anesthesia Type:General  Level of Consciousness: sedated  Airway & Oxygen Therapy: Patient Spontanous Breathing and Patient connected to face mask oxygen  Post-op Assessment: Report given to RN and Post -op Vital signs reviewed and stable  Post vital signs: Reviewed and stable  Last Vitals:  Vitals Value Taken Time  BP 115/77 07/25/19 1152  Temp 36.1 C 07/25/19 1152  Pulse 48 07/25/19 1201  Resp 11 07/25/19 1201  SpO2 100 % 07/25/19 1201  Vitals shown include unvalidated device data.  Last Pain:  Vitals:   07/25/19 1152  TempSrc:   PainSc: Asleep         Complications: No apparent anesthesia complications

## 2019-07-25 NOTE — Anesthesia Postprocedure Evaluation (Signed)
Anesthesia Post Note  Patient: John Gregory  Procedure(s) Performed: HERNIA REPAIR INGUINAL ADULT (Bilateral )  Patient location during evaluation: PACU Anesthesia Type: General Level of consciousness: awake and alert Pain management: pain level controlled Vital Signs Assessment: post-procedure vital signs reviewed and stable Respiratory status: spontaneous breathing and respiratory function stable Cardiovascular status: stable Anesthetic complications: no     Last Vitals:  Vitals:   07/25/19 1212 07/25/19 1222  BP: (!) 136/98 (!) 131/93  Pulse: (!) 58   Resp: 15 13  Temp:    SpO2: 100% 98%    Last Pain:  Vitals:   07/25/19 1222  TempSrc:   PainSc: 0-No pain                 Raegan Winders K

## 2019-07-25 NOTE — Discharge Instructions (Signed)

## 2019-07-25 NOTE — Anesthesia Preprocedure Evaluation (Addendum)
Anesthesia Evaluation  Patient identified by MRN, date of birth, ID band Patient awake    Reviewed: Allergy & Precautions, NPO status , Patient's Chart, lab work & pertinent test results  History of Anesthesia Complications Negative for: history of anesthetic complications  Airway Mallampati: II       Dental   Pulmonary neg sleep apnea, neg COPD, Not current smoker,           Cardiovascular hypertension, Pt. on medications (-) Past MI and (-) CHF (-) dysrhythmias (-) Valvular Problems/Murmurs     Neuro/Psych neg Seizures    GI/Hepatic Neg liver ROS, neg GERD  ,  Endo/Other  neg diabetes  Renal/GU Renal disease (stones, polycycstic kidney)     Musculoskeletal   Abdominal   Peds  Hematology   Anesthesia Other Findings   Reproductive/Obstetrics                            Anesthesia Physical Anesthesia Plan  ASA: II  Anesthesia Plan: General   Post-op Pain Management:    Induction: Intravenous  PONV Risk Score and Plan: Propofol infusion and TIVA  Airway Management Planned: Oral ETT  Additional Equipment:   Intra-op Plan:   Post-operative Plan:   Informed Consent: I have reviewed the patients History and Physical, chart, labs and discussed the procedure including the risks, benefits and alternatives for the proposed anesthesia with the patient or authorized representative who has indicated his/her understanding and acceptance.       Plan Discussed with:   Anesthesia Plan Comments:         Anesthesia Quick Evaluation

## 2019-07-25 NOTE — Op Note (Signed)
Hernia, Open, Procedure Note  Indications: The patient presented with a history of  bilateral, reducible hernias.    Pre-operative Diagnosis: bilateral reducible inguinal hernias  Post-operative Diagnosis: same  Surgeon: Duanne Guess   Assistants: Laneta Simmers, RNFA  Anesthesia: General endotracheal anesthesia  ASA Class: 3  Procedure Details  The patient was seen again in the Holding Room. The risks, benefits, complications, treatment options, and expected outcomes were discussed with the patient. The possibilities of reaction to medication, pulmonary aspiration, perforation of viscus, bleeding, recurrent infection, the need for additional procedures, and development of a complication requiring transfusion or further operation were discussed with the patient and/or family. There was concurrence with the proposed plan, and informed consent was obtained. The site of surgery was properly noted/marked. The patient was taken to the Operating Room, identified as John Gregory, and the procedure verified as hernia repair. A Time Out was held and the above information confirmed.  The patient was placed in the supine position and underwent induction of anesthesia, the lower abdomen and bilateral groins were prepped and draped in the standard fashion.  A one-to-one mixture of 0.25% bupivacaine and 1% lidocaine with epinephrine was used to anesthetize the skin over the mid-portion of the left inguinal canal. A transverse incision was made. Dissection was carried through the soft tissue to expose the inguinal canal and inguinal ligament along its lower edge. The external oblique fascia was split along the course of its fibers, exposing the inguinal canal. The cord and nerve were looped using a Penrose drain and reflected out of the field. The defect was found to be a direct hernia.  It was exposed and a piece of Ultrapro mesh was trimmed to size.  The disc component was placed within the defect.  The  mesh was then secured to the pubic tubercle with 0 Ethibond suture.  Additional Ethibond sutures were used to secure the mesh to the conjoined tendon medially and the shelving edge of the inguinal ligament laterally. The mesh was split to allow passage of the cord and nerve into the canal without entrapment.  The tails of the mesh were then tacked together to recreate the internal ring.  The contents were then returned to canal and the external oblique fascia was then closed in a continuous fashion using 2-0 Vicryl suture taking care not to cause entrapment of the nerve.  Liposomal bupivacaine was infiltrated along the fascial planes.  A moist laparotomy sponge was placed over the incision.  We then turned to the right, and repeated the above procedure.  Once again, the hernia was found to be a direct hernia.  This defect was somewhat smaller and therefore the mesh was trimmed even more to allow it to rest securely within the defect.  We then secured the overlying mesh to the pubic tubercle, conjoined tendon, and shelving edge of the inguinal ligament just as we had on the left.  Once again, the mesh was split to allow passage of the cord and nerve.  The external oblique fascia was closed with 2-0 Vicryl suture, taking care to avoid entrapment of the nerve.  Liposomal bupivacaine was infiltrated along the fascial planes.  We then closed Scarpa's fascia bilaterally with 3-0 Vicryl suture.  Skin was closed with running subcuticular Monocryl.  The skin was cleaned and Dermabond and Steri-Strips were applied.  The patient was awakened, extubated, and taken to the postanesthesia care unit in good condition.  Instrument, sponge, and needle counts were correct prior to closure  and at the conclusion of the case.  Findings: Hernias as above  Estimated Blood Loss: Minimal         Drains: None         Total IV Fluids: 1 L         Specimens: None               Complications: None; patient tolerated the  procedure well.         Disposition: Stable to the postanesthesia care unit with plans for subsequent discharge to home.         Condition: stable

## 2019-07-25 NOTE — Anesthesia Procedure Notes (Signed)
Procedure Name: Intubation Date/Time: 07/25/2019 9:45 AM Performed by: Almeta Monas, CRNA Pre-anesthesia Checklist: Patient identified, Patient being monitored, Timeout performed, Emergency Drugs available and Suction available Patient Re-evaluated:Patient Re-evaluated prior to induction Oxygen Delivery Method: Circle system utilized Preoxygenation: Pre-oxygenation with 100% oxygen Induction Type: IV induction Ventilation: Mask ventilation without difficulty Laryngoscope Size: 3 and McGraph Grade View: Grade I Tube type: Oral Tube size: 7.0 mm Number of attempts: 1 Airway Equipment and Method: Stylet Placement Confirmation: ETT inserted through vocal cords under direct vision,  positive ETCO2 and breath sounds checked- equal and bilateral Secured at: 21 cm Tube secured with: Tape Dental Injury: Teeth and Oropharynx as per pre-operative assessment

## 2019-07-29 ENCOUNTER — Telehealth: Payer: Self-pay | Admitting: Emergency Medicine

## 2019-07-29 NOTE — Telephone Encounter (Signed)
Per Dr Lady Gary,  Oxycodone is [facility] stronger than Tramadol. He should try alternating 600 mg ibuprofen every 6 hours with 650 mg of Tylenol every 6 hours (so that he is taking something for pain every 3 hours) on a scheduled basis for the next 5 days. He can apply ice packs to the groin areas as needed. The oxycodone can be used for breakthrough pain.    Pt made aware of the above. He denied moving appt to earlier day. States he will call the office if he has any other concerns.

## 2019-08-01 ENCOUNTER — Encounter: Payer: Self-pay | Admitting: Emergency Medicine

## 2019-08-01 ENCOUNTER — Other Ambulatory Visit: Payer: Self-pay

## 2019-08-01 ENCOUNTER — Emergency Department
Admission: EM | Admit: 2019-08-01 | Discharge: 2019-08-01 | Disposition: A | Discharge status: Home / Self Care | Payer: Commercial Managed Care - PPO | Attending: Emergency Medicine | Admitting: Emergency Medicine

## 2019-08-01 ENCOUNTER — Emergency Department: Payer: Commercial Managed Care - PPO

## 2019-08-01 DIAGNOSIS — I10 Essential (primary) hypertension: Secondary | ICD-10-CM | POA: Diagnosis not present

## 2019-08-01 DIAGNOSIS — F17290 Nicotine dependence, other tobacco product, uncomplicated: Secondary | ICD-10-CM | POA: Diagnosis not present

## 2019-08-01 DIAGNOSIS — Q612 Polycystic kidney, adult type: Secondary | ICD-10-CM | POA: Insufficient documentation

## 2019-08-01 DIAGNOSIS — G8918 Other acute postprocedural pain: Secondary | ICD-10-CM

## 2019-08-01 LAB — CBC WITH DIFFERENTIAL/PLATELET
Abs Immature Granulocytes: 0.02 10*3/uL (ref 0.00–0.07)
Basophils Absolute: 0 10*3/uL (ref 0.0–0.1)
Basophils Relative: 1 %
Eosinophils Absolute: 0.3 10*3/uL (ref 0.0–0.5)
Eosinophils Relative: 5 %
HCT: 40.5 % (ref 39.0–52.0)
Hemoglobin: 13.4 g/dL (ref 13.0–17.0)
Immature Granulocytes: 0 %
Lymphocytes Relative: 30 %
Lymphs Abs: 1.9 10*3/uL (ref 0.7–4.0)
MCH: 27.1 pg (ref 26.0–34.0)
MCHC: 33.1 g/dL (ref 30.0–36.0)
MCV: 82 fL (ref 80.0–100.0)
Monocytes Absolute: 0.4 10*3/uL (ref 0.1–1.0)
Monocytes Relative: 6 %
Neutro Abs: 3.7 10*3/uL (ref 1.7–7.7)
Neutrophils Relative %: 58 %
Platelets: 222 10*3/uL (ref 150–400)
RBC: 4.94 MIL/uL (ref 4.22–5.81)
RDW: 12.8 % (ref 11.5–15.5)
WBC: 6.4 10*3/uL (ref 4.0–10.5)
nRBC: 0 % (ref 0.0–0.2)

## 2019-08-01 LAB — COMPREHENSIVE METABOLIC PANEL
ALT: 21 U/L (ref 0–44)
AST: 19 U/L (ref 15–41)
Albumin: 3.9 g/dL (ref 3.5–5.0)
Alkaline Phosphatase: 64 U/L (ref 38–126)
Anion gap: 8 (ref 5–15)
BUN: 22 mg/dL — ABNORMAL HIGH (ref 6–20)
CO2: 28 mmol/L (ref 22–32)
Calcium: 9.1 mg/dL (ref 8.9–10.3)
Chloride: 102 mmol/L (ref 98–111)
Creatinine, Ser: 2.36 mg/dL — ABNORMAL HIGH (ref 0.61–1.24)
GFR calc Af Amer: 36 mL/min — ABNORMAL LOW (ref >60)
GFR calc non Af Amer: 31 mL/min — ABNORMAL LOW (ref >60)
Glucose, Bld: 100 mg/dL — ABNORMAL HIGH (ref 70–99)
Potassium: 3.6 mmol/L (ref 3.5–5.1)
Sodium: 138 mmol/L (ref 135–145)
Total Bilirubin: 0.6 mg/dL (ref 0.3–1.2)
Total Protein: 7.3 g/dL (ref 6.5–8.1)

## 2019-08-01 MED ORDER — OXYCODONE-ACETAMINOPHEN 5-325 MG PO TABS: 1.0000 | ORAL_TABLET | Freq: Three times a day (TID) | ORAL | 0 refills | Status: DC | PRN

## 2019-08-01 MED ORDER — SENNA 8.6 MG PO TABS
2.0000 | ORAL_TABLET | Freq: Every day | ORAL | 0 refills | Status: DC
Start: 2019-08-01 — End: 2021-05-24

## 2019-08-01 MED ORDER — DOCUSATE SODIUM 100 MG PO CAPS
100.0000 mg | ORAL_CAPSULE | Freq: Two times a day (BID) | ORAL | 2 refills | Status: AC
Start: 2019-08-01 — End: 2020-07-31

## 2019-08-01 NOTE — ED Triage Notes (Signed)
Pt presents to ER from home, reports 04/30 had hernia repair bilaterally RLQ, LLQ, reports pain has increased, no symptoms of infections. Pt talks in complete sentences no distress noted

## 2019-08-01 NOTE — ED Notes (Addendum)
Pt has swelling near groin area from bilateral hernia surgery that took place last Friday. Swelling noted, no redness or exudate noted from surgical site. Pt denies fevers.

## 2019-08-01 NOTE — ED Provider Notes (Signed)
[facility] Emergency Department Provider Note       Time seen: ----------------------------------------- 9:20 PM on 08/01/2019 -----------------------------------------   I have reviewed the triage vital signs and the nursing notes.  HISTORY   Chief Complaint Post-op Problem    HPI John Gregory is a 54 y.o. male with a history of arthritis, hypertension, kidney stones, polycystic kidney disease, renal disorder who presents to the ED for pain and swelling in the inguinal areas bilaterally.  Patient had bilateral inguinal hernia repair 7 days ago, has not had fevers, chills or other complaints.  Past Medical History:  Diagnosis Date  . Arthritis    hands, shoulders  . Hypertension   . Kidney stone   . Kidney stone   . Kidney stones   . Polycystic kidney disease   . Renal disorder   . Wears contact lenses   . Wears dentures    partial upper and lower    Patient Active Problem List   Diagnosis Date Noted  . Non-recurrent bilateral inguinal hernia without obstruction or gangrene     Past Surgical History:  Procedure Laterality Date  . ANKLE SURGERY Left   . EXCISION MASS NECK Left 10/31/2017   Procedure: EXCISION MASS NECK;  Surgeon: Bud Face, MD;  Location: [facility];  Service: ENT;  Laterality: Left;  . INGUINAL HERNIA REPAIR Bilateral 07/25/2019   Procedure: HERNIA REPAIR INGUINAL ADULT;  Surgeon: Duanne Guess, MD;  Location: [facility];  Service: General;  Laterality: Bilateral;  . KNEE SURGERY Bilateral   . SHOULDER SURGERY Left   . WRIST SURGERY Left     Allergies Bactrim [sulfamethoxazole-trimethoprim], Calcium, and Penicillins  Social History Social History   Tobacco Use  . Smoking status: Never Smoker  . Smokeless tobacco: Current User    Types: Snuff  Substance Use Topics  . Alcohol use: No  . Drug use: No    Review of Systems Constitutional: Negative for fever. Cardiovascular: Negative for  chest pain. Respiratory: Negative for shortness of breath. Gastrointestinal: Positive for lower abdominal and inguinal pain bilaterally Musculoskeletal: Negative for back pain. Skin: Negative for rash. Neurological: Negative for headaches, focal weakness or numbness.  All systems negative/normal/unremarkable except as stated in the HPI  ____________________________________________   PHYSICAL EXAM:  VITAL SIGNS: ED Triage Vitals  Enc Vitals Group     BP 08/01/19 2112 (!) 154/111     Pulse Rate 08/01/19 2112 69     Resp 08/01/19 2112 20     Temp 08/01/19 2112 98.5 F (36.9 C)     Temp Source 08/01/19 2112 Oral     SpO2 08/01/19 2112 100 %     Weight 08/01/19 2114 155 lb (70.3 kg)     Height 08/01/19 2114 5\' 10"  (1.778 m)     Head Circumference --      Peak Flow --      Pain Score 08/01/19 2114 9     Pain Loc --      Pain Edu? --      Excl. in GC? --     Constitutional: Alert and oriented. Well appearing and in no distress. Eyes: Conjunctivae are normal. Normal extraocular movements. Cardiovascular: Normal rate, regular rhythm. No murmurs, rubs, or gallops. Respiratory: Normal respiratory effort without tachypnea nor retractions. Breath sounds are clear and equal bilaterally. No wheezes/rales/rhonchi. Gastrointestinal: Bilateral inguinal swelling and tenderness is noted, no overlying erythema, surgical incision sites are unremarkable Musculoskeletal: Nontender with normal range of motion in extremities. No  lower extremity tenderness nor edema. Neurologic:  Normal speech and language. No gross focal neurologic deficits are appreciated.  Skin:  Skin is warm, dry and intact. No rash noted. Psychiatric: Mood and affect are normal. Speech and behavior are normal.  ____________________________________________  ED COURSE:  As part of my medical decision making, I reviewed the following data within the [place] History obtained from family if available, nursing  notes, old chart and ekg, as well as notes from prior ED visits. Patient presented for bilateral inguinal pain and swelling, we will assess with labs and imaging as indicated at this time.   Procedures  Sie Formisano was evaluated in Emergency Department on 08/01/2019 for the symptoms described in the history of present illness. He was evaluated in the context of the global COVID-19 pandemic, which necessitated consideration that the patient might be at risk for infection with the SARS-CoV-2 virus that causes COVID-19. Institutional protocols and algorithms that pertain to the evaluation of patients at risk for COVID-19 are in a state of rapid change based on information released by regulatory bodies including the CDC and federal and state organizations. These policies and algorithms were followed during the patient's care in the ED.  ____________________________________________   LABS (pertinent positives/negatives)  Labs Reviewed  COMPREHENSIVE METABOLIC PANEL - Abnormal; Notable for the following components:      Result Value   Glucose, Bld 100 (*)    BUN 22 (*)    Creatinine, Ser 2.36 (*)    GFR calc non Af Amer 31 (*)    GFR calc Af Amer 36 (*)    All other components within normal limits  CBC WITH DIFFERENTIAL/PLATELET    RADIOLOGY Images were viewed by me  CT of the abdomen pelvis with contrast  IMPRESSION: 1. No acute intra-abdominal or pelvic pathology. No significant interval change since the prior CT. 2. Polycystic kidney disease. 3. Constipation.  No bowel obstruction. Normal appendix. ____________________________________________   DIFFERENTIAL DIAGNOSIS   Postoperative swelling, infection, abscess, mesh failure  FINAL ASSESSMENT AND PLAN  Postoperative pain and swelling   Plan: The patient had presented for postoperative pain and swelling. Patient's labs were at his baseline, he has chronic kidney disease. Patient's imaging did not reveal any acute findings.   Overall exam seems to be consistent with normal postoperative healing, he may have aggravated or caused additional swelling with straining.  He is advised to have close outpatient follow-up.   Laurence Aly, MD    Note: This note was generated in part or whole with voice recognition software. Voice recognition is usually quite accurate but there are transcription errors that can and very often do occur. I apologize for any typographical errors that were not detected and corrected.     Earleen Newport, MD 08/01/19 2249

## 2019-08-04 ENCOUNTER — Telehealth: Payer: Self-pay | Admitting: General Surgery

## 2019-08-04 NOTE — Telephone Encounter (Signed)
Patient states that he wants a return to work note for going back to work starting today. I have written him his note with lifting restrictions.   He has been having nausea since surgery and has been taking the Zofran but is still experiencing some nausea. I let him know that the Narcotic pain medication could be causing him nausea and that he may want to try over the counter pain medication instead.  He also reports indigestion and burping since surgery. I suggested that he could try Tums for the indigestion and Simethicone for the gas and burping. I also let him know this may also be a side effect of the narcotic pain medication.

## 2019-08-04 NOTE — Telephone Encounter (Signed)
Patient is here and is asking for a letter to return to work and is also asking about on of his medications. Please call patient and advise.

## 2019-08-14 ENCOUNTER — Encounter: Payer: Self-pay | Admitting: General Surgery

## 2019-08-14 ENCOUNTER — Other Ambulatory Visit: Payer: Self-pay

## 2019-08-14 ENCOUNTER — Ambulatory Visit (INDEPENDENT_AMBULATORY_CARE_PROVIDER_SITE_OTHER): Payer: Self-pay | Admitting: General Surgery

## 2019-08-14 VITALS — BP 149/101 | HR 70 | Temp 96.8°F | Ht 70.0 in | Wt 141.2 lb

## 2019-08-14 DIAGNOSIS — K4020 Bilateral inguinal hernia, without obstruction or gangrene, not specified as recurrent: Secondary | ICD-10-CM

## 2019-08-14 NOTE — Patient Instructions (Signed)
Dr.Cannon discussed with patient he may massage Cocoa Butter or Shea Butter onto the area to help with the scar.   Dr.Cannon discussed with patient with time, the pain and discomfort should heal.   Patient may continue to take Ibuprofen or Tylenol to help with the pain and discomfort or inflammation.  Patient may refrain from any heavy lifting or bending, pushing or pulling of 10 pounds or more for three more weeks. For a total of six weeks.  Follow-up with our office as needed.  Please call and ask to speak with a nurse if you develop questions or concerns.

## 2019-08-14 NOTE — Progress Notes (Signed)
John Gregory is here today for a postoperative visit.  He is a 54 year old man who underwent bilateral inguinal hernia repairs about 3 weeks ago.  He did have some difficulty with postoperative pain control, but this has improved with time.  He reports swelling and discomfort in both incisions as well as some pain in his right testicle.  Reports some nausea, but no vomiting.  No fevers or chills.  He does endorse constipation.  He has been alternating warm and cold compresses to both groins.  Today's Vitals   08/14/19 0922  BP: (!) 149/101  Pulse: 70  Temp: (!) 96.8 F (36 C)  TempSrc: Temporal  SpO2: 97%  Weight: 141 lb 3.2 oz (64 kg)  Height: 5\' 10"  (1.778 m)  PainSc: 4   PainLoc: Groin   Body mass index is 20.26 kg/m. Focused examination of the surgical sites demonstrates that both are healing nicely.  There are healing ridges beneath each of the incisions, the right more so than the left.  He does have some tenderness to palpation of his right testicle.  Impression and plan: This is a 54 year old man who had bilateral open inguinal hernia repairs 3 weeks ago.  Overall, he is actually doing very well and his healing is as I would expect for this time after his operation.  He was reassured that the "swelling" that he was concerned about is actually a normal healing ridge and part of the scar formation and healing process.  He should continue to use ibuprofen as an anti-inflammatory.  He may massage the scars with a topical emollient agent to help soften, flatten, and resolve the healing ridges.  He should continue to refrain from lifting anything heavier than 10 pounds for another 3 weeks.  If his testicular pain does not resolve, we may consider referral to urology or initiation of an agent such as Lyrica.  He will keep in communication with 46 regarding this issue.  Otherwise, I will see him on an as-needed basis.

## 2019-08-15 ENCOUNTER — Ambulatory Visit: Payer: Commercial Managed Care - PPO | Attending: Internal Medicine

## 2019-08-15 DIAGNOSIS — Z23 Encounter for immunization: Secondary | ICD-10-CM

## 2019-08-15 NOTE — Progress Notes (Signed)
° °  Covid-19 Vaccination Clinic  Name:  John Gregory    MRN: [id number] DOB: 07/29/1965  08/15/2019  Mr. Marzella was observed post Covid-19 immunization for 15 minutes without incident. He was provided with Vaccine Information Sheet and instruction to access the V-Safe system.   Mr. Lant was instructed to call 911 with any severe reactions post vaccine:  Difficulty breathing   Swelling of face and throat   A fast heartbeat   A bad rash all over body   Dizziness and weakness   Immunizations Administered    Name Date Dose VIS Date Route   Pfizer COVID-19 Vaccine 08/15/2019  8:37 AM 0.3 mL 05/21/2018 Intramuscular   Manufacturer: [employer], [employer]   Lot: [id number]   NDC: [address]-3

## 2019-08-20 ENCOUNTER — Telehealth: Payer: Self-pay | Admitting: *Deleted

## 2019-08-20 MED ORDER — GABAPENTIN 300 MG PO CAPS: 300.0000 mg | ORAL_CAPSULE | Freq: Three times a day (TID) | ORAL | 0 refills | Status: DC

## 2019-08-20 NOTE — Telephone Encounter (Signed)
Patient called and stated that he is still having some pain and pulling at night when he trys to sleep. He stated he sleeps on his stomach and has not been getting much sleep due to the pain and pulling, He was wanting to know if we could recommend or call something in so he can sleep better at night.  He had surgery on 07/25/19 hernia repair by Dr Lady Gary

## 2019-08-20 NOTE — Telephone Encounter (Signed)
Spoke with Dr Everlene Farrier and he is amendable to prescribing a trial of Gabapentin to see if this will help the patient.  Patient is aware and thankful.

## 2019-08-20 NOTE — Telephone Encounter (Signed)
Patient states that he has been alternating Ibuprofen and Tylenol every 4 hours. He has been using heat and ice to the areas. He has the most trouble at night with sleeping as the pain will wake him up. He would like to try Gabapentin as he had discussed this with Dr Lady Gary at his last visit to see if this would help him.

## 2019-09-09 ENCOUNTER — Ambulatory Visit: Payer: Commercial Managed Care - PPO

## 2019-09-11 ENCOUNTER — Telehealth: Payer: Self-pay

## 2019-09-11 NOTE — Telephone Encounter (Signed)
Needs note stating that he had to self quarantine due to covid test prior to surgery covid test done 07/23/19.  Patient to call office Friday morning with fax number.

## 2019-09-13 ENCOUNTER — Ambulatory Visit: Payer: Commercial Managed Care - PPO | Attending: Internal Medicine

## 2019-09-13 DIAGNOSIS — Z23 Encounter for immunization: Secondary | ICD-10-CM

## 2019-09-13 NOTE — Progress Notes (Signed)
   Covid-19 Vaccination Clinic  Name:  John Gregory    MRN: [id number] DOB: 06/01/1965  09/13/2019  [name] was observed post Covid-19 immunization for 15 minutes without incident. He was provided with Vaccine Information Sheet and instruction to access the V-Safe system.   [name] was instructed to call 911 with any severe reactions post vaccine: Marland Kitchen Difficulty breathing  . Swelling of face and throat  . A fast heartbeat  . A bad rash all over body  . Dizziness and weakness   Immunizations Administered    Name Date Dose VIS Date Route   Pfizer COVID-19 Vaccine 09/13/2019  8:35 AM 0.3 mL 05/21/2018 Intramuscular   Manufacturer: [employer], [employer]   Lot: [id number]   NDC: [address]-1

## 2020-02-11 ENCOUNTER — Emergency Department: Payer: Commercial Managed Care - PPO

## 2020-02-11 ENCOUNTER — Encounter: Payer: Self-pay | Admitting: Emergency Medicine

## 2020-02-11 ENCOUNTER — Emergency Department
Admission: EM | Admit: 2020-02-11 | Discharge: 2020-02-11 | Disposition: A | Discharge status: Home / Self Care | Payer: Commercial Managed Care - PPO | Attending: Emergency Medicine | Admitting: Emergency Medicine

## 2020-02-11 ENCOUNTER — Other Ambulatory Visit: Payer: Self-pay

## 2020-02-11 DIAGNOSIS — R109 Unspecified abdominal pain: Secondary | ICD-10-CM | POA: Diagnosis not present

## 2020-02-11 DIAGNOSIS — K5901 Slow transit constipation: Secondary | ICD-10-CM | POA: Diagnosis not present

## 2020-02-11 DIAGNOSIS — I10 Essential (primary) hypertension: Secondary | ICD-10-CM | POA: Insufficient documentation

## 2020-02-11 DIAGNOSIS — Z79899 Other long term (current) drug therapy: Secondary | ICD-10-CM | POA: Diagnosis not present

## 2020-02-11 LAB — CBC
HCT: 39.4 % (ref 39.0–52.0)
Hemoglobin: 13.1 g/dL (ref 13.0–17.0)
MCH: 27.6 pg (ref 26.0–34.0)
MCHC: 33.2 g/dL (ref 30.0–36.0)
MCV: 82.9 fL (ref 80.0–100.0)
Platelets: 196 10*3/uL (ref 150–400)
RBC: 4.75 MIL/uL (ref 4.22–5.81)
RDW: 12.9 % (ref 11.5–15.5)
WBC: 4 10*3/uL (ref 4.0–10.5)
nRBC: 0 % (ref 0.0–0.2)

## 2020-02-11 LAB — URINALYSIS, COMPLETE (UACMP) WITH MICROSCOPIC
Bacteria, UA: NONE SEEN
Bilirubin Urine: NEGATIVE
Glucose, UA: NEGATIVE mg/dL
Hgb urine dipstick: NEGATIVE
Ketones, ur: NEGATIVE mg/dL
Leukocytes,Ua: NEGATIVE
Nitrite: NEGATIVE
Protein, ur: NEGATIVE mg/dL
Specific Gravity, Urine: 1.006 (ref 1.005–1.030)
pH: 8 (ref 5.0–8.0)

## 2020-02-11 LAB — COMPREHENSIVE METABOLIC PANEL
ALT: 10 U/L (ref 0–44)
AST: 17 U/L (ref 15–41)
Albumin: 3.8 g/dL (ref 3.5–5.0)
Alkaline Phosphatase: 51 U/L (ref 38–126)
Anion gap: 9 (ref 5–15)
BUN: 12 mg/dL (ref 6–20)
CO2: 26 mmol/L (ref 22–32)
Calcium: 8.5 mg/dL — ABNORMAL LOW (ref 8.9–10.3)
Chloride: 102 mmol/L (ref 98–111)
Creatinine, Ser: 2.03 mg/dL — ABNORMAL HIGH (ref 0.61–1.24)
GFR, Estimated: 39 mL/min — ABNORMAL LOW (ref >60)
Glucose, Bld: 94 mg/dL (ref 70–99)
Potassium: 4.2 mmol/L (ref 3.5–5.1)
Sodium: 137 mmol/L (ref 135–145)
Total Bilirubin: 0.9 mg/dL (ref 0.3–1.2)
Total Protein: 6.9 g/dL (ref 6.5–8.1)

## 2020-02-11 LAB — LIPASE, BLOOD: Lipase: 38 U/L (ref 11–51)

## 2020-02-11 MED ORDER — POLYETHYLENE GLYCOL 3350 17 G PO PACK
17.0000 g | PACK | Freq: Every day | ORAL | 0 refills | Status: DC
Start: 2020-02-11 — End: 2021-05-24

## 2020-02-11 MED ORDER — AMLODIPINE BESYLATE 10 MG PO TABS: 10.0000 mg | ORAL_TABLET | Freq: Every day | ORAL | 1 refills | Status: AC

## 2020-02-11 MED ORDER — ONDANSETRON HCL 4 MG/2ML IJ SOLN
4.0000 mg | Freq: Once | INTRAMUSCULAR | Status: AC
  Administered 2020-02-11: 4 mg via INTRAVENOUS
  Filled 2020-02-11: qty 2

## 2020-02-11 MED ORDER — MORPHINE SULFATE (PF) 4 MG/ML IV SOLN
4.0000 mg | Freq: Once | INTRAVENOUS | Status: AC
  Administered 2020-02-11: 4 mg via INTRAVENOUS
  Filled 2020-02-11: qty 1

## 2020-02-11 MED ORDER — BENAZEPRIL-HYDROCHLOROTHIAZIDE 20-12.5 MG PO TABS
2.0000 | ORAL_TABLET | Freq: Every day | ORAL | 1 refills | Status: DC
Start: 2020-02-11 — End: 2021-05-26

## 2020-02-11 MED ORDER — MAGNESIUM CITRATE PO SOLN
1.0000 | Freq: Once | ORAL | Status: AC
  Administered 2020-02-11: 1 via ORAL
  Filled 2020-02-11: qty 296

## 2020-02-11 MED ORDER — BISACODYL 10 MG RE SUPP: 10.0000 mg | RECTAL | 0 refills | Status: DC | PRN

## 2020-02-11 NOTE — ED Triage Notes (Signed)
Pt comes into the Ed via POV c/o abdominal pain.  Pt states he feels like he has a blockage, but he is still able to pas gas.  Pt states he has irregular BM on a normal basis.  Pt also recently had hernia surgery a couple months ago.  Pt has h/o kidney stones. Last BM this morning but he has taken 3 enemas and laxatives.  Pt states he has some nausea.  Pt presents HTN in triage and states he has been out of BP medication for a month.

## 2020-02-11 NOTE — ED Provider Notes (Signed)
[facility] Emergency Department Provider Note   ____________________________________________    I have reviewed the triage vital signs and the nursing notes.   HISTORY  Chief Complaint Abdominal Pain     HPI John Gregory is a 54 y.o. male with a history of kidney stones, polycystic kidney disease, diverticulitis who presents with complaints of left lower quadrant abdominal pain.  Patient reports the pain started relatively suddenly yesterday and has been continuous since then.  He is not take anything for this.  Denies fevers or chills, positive nausea.  No sick contacts reported.  He does report he has been taking laxatives for constipation as well.  Past Medical History:  Diagnosis Date  . Arthritis    hands, shoulders  . Hypertension   . Kidney stone   . Kidney stone   . Kidney stones   . Polycystic kidney disease   . Renal disorder   . Wears contact lenses   . Wears dentures    partial upper and lower    Patient Active Problem List   Diagnosis Date Noted  . Non-recurrent bilateral inguinal hernia without obstruction or gangrene     Past Surgical History:  Procedure Laterality Date  . ANKLE SURGERY Left   . EXCISION MASS NECK Left 10/31/2017   Procedure: EXCISION MASS NECK;  Surgeon: Bud Face, MD;  Location: [facility];  Service: ENT;  Laterality: Left;  . INGUINAL HERNIA REPAIR Bilateral 07/25/2019   Procedure: HERNIA REPAIR INGUINAL ADULT;  Surgeon: Duanne Guess, MD;  Location: [facility];  Service: General;  Laterality: Bilateral;  . KNEE SURGERY Bilateral   . SHOULDER SURGERY Left   . WRIST SURGERY Left     Prior to Admission medications   Medication Sig Start Date End Date Taking? Authorizing Provider  acetaminophen (TYLENOL) 500 MG tablet Take 2 tablets (1,000 mg total) by mouth every 6 (six) hours as needed for up to 30 doses for mild pain, moderate pain, fever or headache. Patient not taking:  Reported on 08/14/2019 07/25/19   Duanne Guess, MD  amLODipine (NORVASC) 10 MG tablet Take 1 tablet (10 mg total) by mouth daily. 02/11/20   Jene Every, MD  atorvastatin (LIPITOR) 80 MG tablet Take 80 mg by mouth daily. 07/22/19   [provider]  benazepril-hydrochlorthiazide (LOTENSIN HCT) 20-12.5 MG tablet Take 2 tablets by mouth daily. 02/11/20   Jene Every, MD  betamethasone valerate ointment (VALISONE) 0.1 %  08/13/19   [provider]  bisacodyl (DULCOLAX) 10 MG suppository Place 1 suppository (10 mg total) rectally as needed for moderate constipation. 02/11/20   Jene Every, MD  docusate sodium (COLACE) 100 MG capsule Take 1 capsule (100 mg total) by mouth 2 (two) times daily. 08/01/19 07/31/20  Emily Filbert, MD  gabapentin (NEURONTIN) 300 MG capsule Take 1 capsule (300 mg total) by mouth 3 (three) times daily. 08/20/19   Pabon, Diego F, MD  ibuprofen (ADVIL) 800 MG tablet Take 800 mg by mouth every 8 (eight) hours as needed. 07/25/19   [provider]  lisinopril (ZESTRIL) 40 MG tablet Take 40 mg by mouth daily. 08/13/19   [provider]  LUMIGAN 0.01 % SOLN Place 1 drop into both eyes at bedtime.  07/06/19   [provider]  ondansetron (ZOFRAN ODT) 4 MG disintegrating tablet Take 1 tablet (4 mg total) by mouth every 8 (eight) hours as needed for nausea or vomiting. 07/25/19   Duanne Guess, MD  oxyCODONE (OXY  IR/ROXICODONE) 5 MG immediate release tablet Take 1 tablet (5 mg total) by mouth every 6 (six) hours as needed for severe pain. Patient not taking: Reported on 08/14/2019 07/25/19   Duanne Guess, MD  oxyCODONE-acetaminophen (PERCOCET) 5-325 MG tablet Take 1 tablet by mouth every 8 (eight) hours as needed. 08/01/19   Emily Filbert, MD  polyethylene glycol (MIRALAX) 17 g packet Take 17 g by mouth daily. 02/11/20   Jene Every, MD  senna (SENOKOT) 8.6 MG TABS tablet Take 2 tablets (17.2 mg total) by mouth at  bedtime. Patient not taking: Reported on 08/14/2019 08/01/19   Emily Filbert, MD     Allergies Bactrim [sulfamethoxazole-trimethoprim], Calcium, and Penicillins  Family History  Problem Relation Age of Onset  . Polycystic kidney disease Mother   . Polycystic kidney disease Sister     Social History Social History   Tobacco Use  . Smoking status: Never Smoker  . Smokeless tobacco: Current User    Types: Snuff  Vaping Use  . Vaping Use: Never used  Substance Use Topics  . Alcohol use: No  . Drug use: No    Review of Systems  Constitutional: No fever/chills Eyes: No visual changes.  ENT: No sore throat. Cardiovascular: Denies chest pain. Respiratory: Denies shortness of breath. Gastrointestinal: As above Genitourinary: Negative for dysuria.  No hematuria noted Musculoskeletal: Negative for back pain. Skin: Negative for rash. Neurological: Negative for headaches or weakness   ____________________________________________   PHYSICAL EXAM:  VITAL SIGNS: ED Triage Vitals [02/11/20 1524]  Enc Vitals Group     BP (!) 181/127     Pulse Rate 68     Resp 20     Temp 98.2 F (36.8 C)     Temp Source Oral     SpO2 98 %     Weight 65.8 kg (145 lb)     Height 1.778 m (5\' 10" )     Head Circumference      Peak Flow      Pain Score 8     Pain Loc      Pain Edu?      Excl. in GC?     Constitutional: Alert and oriented.    Mouth/Throat: Mucous membranes are moist.    Cardiovascular: Normal rate, regular rhythm. Grossly normal heart sounds.  Good peripheral circulation. Respiratory: Normal respiratory effort.  No retractions. Lungs CTAB. Gastrointestinal: Soft, tenderness palpation left lower quadrant, no distention, mild left CVA tenderness Genitourinary: deferred Musculoskeletal: No lower extremity tenderness nor edema.  Warm and well perfused Neurologic:  Normal speech and language. No gross focal neurologic deficits are appreciated.  Skin:  Skin is warm,  dry and intact. No rash noted. Psychiatric: Mood and affect are normal. Speech and behavior are normal.  ____________________________________________   LABS (all labs ordered are listed, but only abnormal results are displayed)  Labs Reviewed  COMPREHENSIVE METABOLIC PANEL - Abnormal; Notable for the following components:      Result Value   Creatinine, Ser 2.03 (*)    Calcium 8.5 (*)    GFR, Estimated 39 (*)    All other components within normal limits  URINALYSIS, COMPLETE (UACMP) WITH MICROSCOPIC - Abnormal; Notable for the following components:   Color, Urine STRAW (*)    APPearance CLEAR (*)    All other components within normal limits  LIPASE, BLOOD  CBC   ____________________________________________  EKG  ED ECG REPORT I, , the attending physician, personally viewed and interpreted this ECG.  Date: 02/11/2020  Rhythm: normal sinus rhythm QRS Axis: normal Intervals: normal ST/T Wave abnormalities: normal Narrative Interpretation: no evidence of acute ischemia  ____________________________________________  RADIOLOGY  None ____________________________________________   PROCEDURES  Procedure(s) performed: No  Procedures   Critical Care performed: No ____________________________________________   INITIAL IMPRESSION / ASSESSMENT AND PLAN / ED COURSE  Pertinent labs & imaging results that were available during my care of the patient were reviewed by me and considered in my medical decision making (see chart for details).  Patient presents with left lower quadrant abdominal pain as described above.  Differential includes diverticulitis, ureterolithiasis, constipation  We will treat the patient with IV morphine, IV Zofran, he does have a history of chronic kidney disease so we will avoid Toradol in this instance.  Will obtain CT renal stone study to evaluate for diverticulitis/ureterolithiasis.  CT is reviewed by me, quite reassuring, no  ureterolithiasis.  Known history of polycystic kidney disease.  Patient feeling improved after treatment.  Suspect constipation as the cause of his abdominal pain, will give mag citrate here, MiraLAX and Dulcolax for home use, outpatient follow-up with PCP.   ____________________________________________   FINAL CLINICAL IMPRESSION(S) / ED DIAGNOSES  Final diagnoses:  Abdominal pain, unspecified abdominal location  Slow transit constipation        Note:  This document was prepared using Dragon voice recognition software and may include unintentional dictation errors.   Jene Every, MD 02/11/20 2120

## 2020-11-06 ENCOUNTER — Emergency Department: Payer: Commercial Managed Care - PPO

## 2020-11-06 ENCOUNTER — Emergency Department
Admission: EM | Admit: 2020-11-06 | Discharge: 2020-11-07 | Disposition: A | Discharge status: Home / Self Care | Payer: Commercial Managed Care - PPO | Attending: Emergency Medicine | Admitting: Emergency Medicine

## 2020-11-06 ENCOUNTER — Encounter: Payer: Self-pay | Admitting: Emergency Medicine

## 2020-11-06 DIAGNOSIS — I10 Essential (primary) hypertension: Secondary | ICD-10-CM

## 2020-11-06 DIAGNOSIS — Z79899 Other long term (current) drug therapy: Secondary | ICD-10-CM | POA: Insufficient documentation

## 2020-11-06 DIAGNOSIS — F17290 Nicotine dependence, other tobacco product, uncomplicated: Secondary | ICD-10-CM | POA: Insufficient documentation

## 2020-11-06 DIAGNOSIS — M25572 Pain in left ankle and joints of left foot: Secondary | ICD-10-CM | POA: Diagnosis not present

## 2020-11-06 LAB — BASIC METABOLIC PANEL
Anion gap: 5 (ref 5–15)
BUN: 16 mg/dL (ref 6–20)
CO2: 26 mmol/L (ref 22–32)
Calcium: 8.5 mg/dL — ABNORMAL LOW (ref 8.9–10.3)
Chloride: 108 mmol/L (ref 98–111)
Creatinine, Ser: 2.18 mg/dL — ABNORMAL HIGH (ref 0.61–1.24)
GFR, Estimated: 36 mL/min — ABNORMAL LOW (ref >60)
Glucose, Bld: 89 mg/dL (ref 70–99)
Potassium: 3.7 mmol/L (ref 3.5–5.1)
Sodium: 139 mmol/L (ref 135–145)

## 2020-11-06 LAB — URINALYSIS, COMPLETE (UACMP) WITH MICROSCOPIC
Bacteria, UA: NONE SEEN
Bilirubin Urine: NEGATIVE
Glucose, UA: NEGATIVE mg/dL
Hgb urine dipstick: NEGATIVE
Ketones, ur: NEGATIVE mg/dL
Leukocytes,Ua: NEGATIVE
Nitrite: NEGATIVE
Protein, ur: NEGATIVE mg/dL
Specific Gravity, Urine: 1.003 — ABNORMAL LOW (ref 1.005–1.030)
Squamous Epithelial / HPF: NONE SEEN (ref 0–5)
pH: 7 (ref 5.0–8.0)

## 2020-11-06 LAB — CBC
HCT: 35.4 % — ABNORMAL LOW (ref 39.0–52.0)
Hemoglobin: 11.9 g/dL — ABNORMAL LOW (ref 13.0–17.0)
MCH: 28.4 pg (ref 26.0–34.0)
MCHC: 33.6 g/dL (ref 30.0–36.0)
MCV: 84.5 fL (ref 80.0–100.0)
Platelets: 190 10*3/uL (ref 150–400)
RBC: 4.19 MIL/uL — ABNORMAL LOW (ref 4.22–5.81)
RDW: 13.7 % (ref 11.5–15.5)
WBC: 4.7 10*3/uL (ref 4.0–10.5)
nRBC: 0 % (ref 0.0–0.2)

## 2020-11-06 MED ORDER — HYDROCODONE-ACETAMINOPHEN 5-325 MG PO TABS
2.0000 | ORAL_TABLET | Freq: Once | ORAL | Status: AC
  Administered 2020-11-06: 2 via ORAL
  Filled 2020-11-06: qty 2

## 2020-11-06 MED ORDER — AMLODIPINE BESYLATE 5 MG PO TABS
10.0000 mg | ORAL_TABLET | Freq: Once | ORAL | Status: AC
  Administered 2020-11-06: 10 mg via ORAL
  Filled 2020-11-06: qty 2

## 2020-11-06 MED ORDER — HYDROCHLOROTHIAZIDE 12.5 MG PO CAPS
12.5000 mg | ORAL_CAPSULE | Freq: Once | ORAL | Status: AC
  Administered 2020-11-06: 12.5 mg via ORAL
  Filled 2020-11-06: qty 1

## 2020-11-06 MED ORDER — BENAZEPRIL HCL 20 MG PO TABS
20.0000 mg | ORAL_TABLET | Freq: Once | ORAL | Status: AC
  Administered 2020-11-07: 20 mg via ORAL
  Filled 2020-11-06: qty 1

## 2020-11-06 NOTE — ED Triage Notes (Signed)
Pt to ED via POV with c/o L ankle pain, pt states hx of surgery when he was 14, states several days ago felt a pop, now feels like something is rubbing together.   Pt also states has been out of his BP meds  x several days.

## 2020-11-06 NOTE — ED Notes (Signed)
Left ankle wrapped with an ACE wrap at this time.

## 2020-11-06 NOTE — ED Provider Notes (Addendum)
[facility] Emergency Department Provider Note  ____________________________________________   Event Date/Time   First MD Initiated Contact with Patient 11/06/20 2302     (approximate)  I have reviewed the triage vital signs and the nursing notes.   HISTORY  Chief Complaint Ankle Pain    HPI Conner Muegge is a 55 y.o. male with history of polycystic kidney disease, hypertension who presents to the emergency department with complaints of left ankle pain for the past few days.  States it "popped" and then he started having pain around the lateral ankle.  No other injury.  States he did have surgery after a fracture to this area when he was 55 years old.  He denies any fevers, swelling, calf tenderness.  Has been able to ambulate.  Patient also found to be extremely hypertensive here.  States he has been out of his blood pressure medication for the last week.  He has refills at his pharmacy and plans to pick them up tomorrow.  States that he thinks his blood pressure normally runs in the 140s/80s.  Complaining of a slight headache.  No vision changes, chest pain, shortness of breath, numbness, tingling, weakness, nausea or vomiting.  Denies any illicit drug use.        Past Medical History:  Diagnosis Date   Arthritis    hands, shoulders   Hypertension    Kidney stone    Kidney stone    Kidney stones    Polycystic kidney disease    Renal disorder    Wears contact lenses    Wears dentures    partial upper and lower    Patient Active Problem List   Diagnosis Date Noted   Non-recurrent bilateral inguinal hernia without obstruction or gangrene     Past Surgical History:  Procedure Laterality Date   ANKLE SURGERY Left    EXCISION MASS NECK Left 10/31/2017   Procedure: EXCISION MASS NECK;  Surgeon: Bud Face, MD;  Location: [facility];  Service: ENT;  Laterality: Left;   INGUINAL HERNIA REPAIR Bilateral 07/25/2019   Procedure:  HERNIA REPAIR INGUINAL ADULT;  Surgeon: Duanne Guess, MD;  Location: [facility];  Service: General;  Laterality: Bilateral;   KNEE SURGERY Bilateral    SHOULDER SURGERY Left    WRIST SURGERY Left     Prior to Admission medications   Medication Sig Start Date End Date Taking? Authorizing Provider  HYDROcodone-acetaminophen (NORCO/VICODIN) 5-325 MG tablet Take 1 tablet by mouth every 4 (four) hours as needed. 11/07/20  Yes Owynn Mosqueda, Baxter Hire N, DO  ondansetron (ZOFRAN ODT) 4 MG disintegrating tablet Take 1 tablet (4 mg total) by mouth every 6 (six) hours as needed for nausea or vomiting. 11/07/20  Yes Alistair Senft, Layla Maw, DO  acetaminophen (TYLENOL) 500 MG tablet Take 2 tablets (1,000 mg total) by mouth every 6 (six) hours as needed for up to 30 doses for mild pain, moderate pain, fever or headache. Patient not taking: Reported on 08/14/2019 07/25/19   Duanne Guess, MD  amLODipine (NORVASC) 10 MG tablet Take 1 tablet (10 mg total) by mouth daily. 02/11/20   Jene Every, MD  atorvastatin (LIPITOR) 80 MG tablet Take 80 mg by mouth daily. 07/22/19   [provider]  benazepril-hydrochlorthiazide (LOTENSIN HCT) 20-12.5 MG tablet Take 2 tablets by mouth daily. 02/11/20   Jene Every, MD  betamethasone valerate ointment (VALISONE) 0.1 %  08/13/19   [provider]  bisacodyl (DULCOLAX) 10 MG suppository Place 1 suppository (10  mg total) rectally as needed for moderate constipation. 02/11/20   Jene Every, MD  gabapentin (NEURONTIN) 300 MG capsule Take 1 capsule (300 mg total) by mouth 3 (three) times daily. 08/20/19   Pabon, Diego F, MD  ibuprofen (ADVIL) 800 MG tablet Take 800 mg by mouth every 8 (eight) hours as needed. 07/25/19   [provider]  lisinopril (ZESTRIL) 40 MG tablet Take 40 mg by mouth daily. 08/13/19   [provider]  LUMIGAN 0.01 % SOLN Place 1 drop into both eyes at bedtime.  07/06/19   [provider]  oxyCODONE (OXY IR/ROXICODONE) 5 MG  immediate release tablet Take 1 tablet (5 mg total) by mouth every 6 (six) hours as needed for severe pain. Patient not taking: Reported on 08/14/2019 07/25/19   Duanne Guess, MD  oxyCODONE-acetaminophen (PERCOCET) 5-325 MG tablet Take 1 tablet by mouth every 8 (eight) hours as needed. 08/01/19   Emily Filbert, MD  polyethylene glycol (MIRALAX) 17 g packet Take 17 g by mouth daily. 02/11/20   Jene Every, MD  senna (SENOKOT) 8.6 MG TABS tablet Take 2 tablets (17.2 mg total) by mouth at bedtime. Patient not taking: Reported on 08/14/2019 08/01/19   Emily Filbert, MD    Allergies Bactrim [sulfamethoxazole-trimethoprim], Calcium, and Penicillins  Family History  Problem Relation Age of Onset   Polycystic kidney disease Mother    Polycystic kidney disease Sister     Social History Social History   Tobacco Use   Smoking status: Never   Smokeless tobacco: Current    Types: Snuff  Vaping Use   Vaping Use: Never used  Substance Use Topics   Alcohol use: No   Drug use: No    Review of Systems Constitutional: No fever. Eyes: No visual changes. ENT: No sore throat. Cardiovascular: Denies chest pain. Respiratory: Denies shortness of breath. Gastrointestinal: No nausea, vomiting, diarrhea. Genitourinary: Negative for dysuria. Musculoskeletal: Negative for back pain. Skin: Negative for rash. Neurological: Negative for focal weakness or numbness.  ____________________________________________   PHYSICAL EXAM:  VITAL SIGNS: ED Triage Vitals  Enc Vitals Group     BP 11/06/20 2149 (!) 220/130     Pulse Rate 11/06/20 2149 (!) 58     Resp 11/06/20 2149 20     Temp 11/06/20 2149 98.2 F (36.8 C)     Temp Source 11/06/20 2149 Oral     SpO2 11/06/20 2149 100 %     Weight 11/06/20 2150 150 lb (68 kg)     Height 11/06/20 2150 5\' 10"  (1.778 m)     Head Circumference --      Peak Flow --      Pain Score 11/06/20 2149 6     Pain Loc --      Pain Edu? --      Excl. in  GC? --    CONSTITUTIONAL: Alert and oriented and responds appropriately to questions. Well-appearing; well-nourished HEAD: Normocephalic EYES: Conjunctivae clear, pupils appear equal, EOM appear intact ENT: normal nose; moist mucous membranes NECK: Supple, normal ROM CARD: Regular and bradycardic; S1 and S2 appreciated; no murmurs, no clicks, no rubs, no gallops RESP: Normal chest excursion without splinting or tachypnea; breath sounds clear and equal bilaterally; no wheezes, no rhonchi, no rales, no hypoxia or respiratory distress, speaking full sentences ABD/GI: Normal bowel sounds; non-distended; soft, non-tender, no rebound, no guarding, no peritoneal signs, no hepatosplenomegaly BACK: The back appears normal EXT: Normal ROM in all joints; no deformity noted, no edema; no  cyanosis, patient does have some tenderness palpation over the lateral malleolus and distal lateral calf without soft tissue swelling, redness, warmth, ecchymosis, fluctuance, induration or other lesions noted.  He has a 2+ left DP pulse.  No calf tenderness or calf swelling.  Compartments are soft.  No tenderness in the foot, knee.  Patient has full range of motion in all joints of the left lower extremity. SKIN: Normal color for age and race; warm; no rash on exposed skin NEURO: Moves all extremities equally, normal speech, cranial nerves II through XII intact, normal sensation diffusely PSYCH: The patient's mood and manner are appropriate.  ____________________________________________   LABS (all labs ordered are listed, but only abnormal results are displayed)  Labs Reviewed  CBC - Abnormal; Notable for the following components:      Result Value   RBC 4.19 (*)    Hemoglobin 11.9 (*)    HCT 35.4 (*)    All other components within normal limits  BASIC METABOLIC PANEL - Abnormal; Notable for the following components:   Creatinine, Ser 2.18 (*)    Calcium 8.5 (*)    GFR, Estimated 36 (*)    All other components  within normal limits  URINALYSIS, COMPLETE (UACMP) WITH MICROSCOPIC - Abnormal; Notable for the following components:   Color, Urine COLORLESS (*)    APPearance CLEAR (*)    Specific Gravity, Urine 1.003 (*)    All other components within normal limits  URINE DRUG SCREEN, QUALITATIVE ([facility] ONLY)   ____________________________________________  EKG   EKG Interpretation  Date/Time:  Saturday November 06 2020 21:54:19 EDT Ventricular Rate:  57 PR Interval:  140 QRS Duration: 94 QT Interval:  424 QTC Calculation: 412 R Axis:   77 Text Interpretation: Sinus bradycardia Otherwise normal ECG Confirmed by Rochele RaringWard, Amarionna Arca [phone]([id number]) on 11/06/2020 11:07:08 PM        ____________________________________________  RADIOLOGY Normajean BaxterI, Chiyo Fay, personally viewed and evaluated these images (plain radiographs) as part of my medical decision making, as well as reviewing the written report by the radiologist.  ED MD interpretation: Left ankle x-ray shows no acute abnormality.  Official radiology report(s): DG Ankle Complete Left  Result Date: 11/06/2020 CLINICAL DATA:  Left ankle pain EXAM: LEFT ANKLE COMPLETE - 3+ VIEW COMPARISON:  None. FINDINGS: No fracture or dislocation is seen. The ankle mortise is intact. The base of the fifth metatarsal is unremarkable. Visualized soft tissues are within normal limits. IMPRESSION: Negative. Electronically Signed   By: Charline BillsSriyesh  Krishnan M.D.   On: 11/06/2020 22:32    ____________________________________________   PROCEDURES  Procedure(s) performed (including Critical Care):  Procedures   ____________________________________________   INITIAL IMPRESSION / ASSESSMENT AND PLAN / ED COURSE  As part of my medical decision making, I reviewed the following data within the electronic MEDICAL RECORD NUMBER Nursing notes reviewed and incorporated, Labs reviewed , EKG interpreted , Old EKG reviewed, Old chart reviewed, Radiograph reviewed , and Notes from prior ED  visits         Patient here with left-sided ankle pain.  X-ray obtained from triage shows no fracture, dislocation.  No signs of cellulitis, septic arthritis, gout, DVT, arterial obstruction, compartment syndrome.  Suspect sprain, tendinitis.  Recommended rest, elevation, ice.  We will place an Ace wrap.  Given his history of polycystic kidney disease and elevated creatinine, patient cannot take NSAIDs.  Will give Vicodin for pain control and reassess.  Patient also found to be very hypertensive here.  He is also bradycardic.  He  is not sure what his heart rate normally runs but states his blood pressure is not normally this high.  States he has not taken any medication for his blood pressure in about a week.  Complaints of "slight" headache but no other symptoms.  Neurologically intact.  Low suspicion for intracranial hemorrhage.  Labs, EKG, urine obtained from triage show no signs of endorgan damage today.  His creatinine is stable compared to previous.  We will give his home medications of benazepril, hydrochlorothiazide and amlodipine and reassess his blood pressure.  On review of his previous records and vital signs it appears his heart rate has been in the 40s in 2020 and he is frequently hypertensive.  ED PROGRESS  Patient's blood pressure has improved to 177/97 with a heart rate of 52.  He has no complaints at this time.  Will discharge with pain medication and outpatient orthopedic follow-up for his left ankle pain.  Suspect sprain, tendinitis.  He will pick up his blood pressure medication in the morning.  Discussed return precautions with this.  He verbalized understanding and is comfortable with this plan.  At this time, I do not feel there is any life-threatening condition present. I have reviewed, interpreted and discussed all results (EKG, imaging, lab, urine as appropriate) and exam findings with patient/family. I have reviewed nursing notes and appropriate previous records.  I feel  the patient is safe to be discharged home without further emergent workup and can continue workup as an outpatient as needed. Discussed usual and customary return precautions. Patient/family verbalize understanding and are comfortable with this plan.  Outpatient follow-up has been provided as needed. All questions have been answered.   1:35 AM  BP 181/95 with HR 51. ____________________________________________   FINAL CLINICAL IMPRESSION(S) / ED DIAGNOSES  Final diagnoses:  Acute left ankle pain  Primary hypertension     ED Discharge Orders          Ordered    HYDROcodone-acetaminophen (NORCO/VICODIN) 5-325 MG tablet  Every 4 hours PRN        11/07/20 0130    ondansetron (ZOFRAN ODT) 4 MG disintegrating tablet  Every 6 hours PRN        11/07/20 0131            *Please note:  Grayland Daisey was evaluated in Emergency Department on 11/07/2020 for the symptoms described in the history of present illness. He was evaluated in the context of the global COVID-19 pandemic, which necessitated consideration that the patient might be at risk for infection with the SARS-CoV-2 virus that causes COVID-19. Institutional protocols and algorithms that pertain to the evaluation of patients at risk for COVID-19 are in a state of rapid change based on information released by regulatory bodies including the CDC and federal and state organizations. These policies and algorithms were followed during the patient's care in the ED.  Some ED evaluations and interventions may be delayed as a result of limited staffing during and the pandemic.*   Note:  This document was prepared using Dragon voice recognition software and may include unintentional dictation errors.    Alesandro Stueve, Layla Maw, DO 11/07/20 0131    Morningstar Toft, Layla Maw, DO 11/07/20 [phone]

## 2020-11-07 LAB — URINE DRUG SCREEN, QUALITATIVE (ARMC ONLY)
Amphetamines, Ur Screen: NOT DETECTED
Barbiturates, Ur Screen: NOT DETECTED
Benzodiazepine, Ur Scrn: NOT DETECTED
Cannabinoid 50 Ng, Ur ~~LOC~~: NOT DETECTED
Cocaine Metabolite,Ur ~~LOC~~: NOT DETECTED
MDMA (Ecstasy)Ur Screen: NOT DETECTED
Methadone Scn, Ur: NOT DETECTED
Opiate, Ur Screen: NOT DETECTED
Phencyclidine (PCP) Ur S: NOT DETECTED
Tricyclic, Ur Screen: NOT DETECTED

## 2020-11-07 MED ORDER — HYDROCODONE-ACETAMINOPHEN 5-325 MG PO TABS
1.0000 | ORAL_TABLET | ORAL | 0 refills | Status: DC | PRN
Start: 2020-11-07 — End: 2021-05-24

## 2020-11-07 MED ORDER — ONDANSETRON 4 MG PO TBDP: 4.0000 mg | ORAL_TABLET | Freq: Four times a day (QID) | ORAL | 0 refills | Status: DC | PRN

## 2020-11-07 MED ORDER — HYDRALAZINE HCL 20 MG/ML IJ SOLN
10.0000 mg | Freq: Once | INTRAMUSCULAR | Status: AC
  Administered 2020-11-07: 10 mg via INTRAVENOUS
  Filled 2020-11-07: qty 1

## 2020-11-07 NOTE — Discharge Instructions (Addendum)

## 2020-12-31 ENCOUNTER — Encounter: Payer: Self-pay | Admitting: General Surgery

## 2021-05-24 ENCOUNTER — Encounter: Payer: Self-pay | Admitting: *Deleted

## 2021-05-24 ENCOUNTER — Emergency Department: Payer: Commercial Managed Care - PPO

## 2021-05-24 ENCOUNTER — Observation Stay (HOSPITAL_BASED_OUTPATIENT_CLINIC_OR_DEPARTMENT_OTHER): Payer: Commercial Managed Care - PPO

## 2021-05-24 ENCOUNTER — Other Ambulatory Visit: Payer: Self-pay

## 2021-05-24 ENCOUNTER — Observation Stay
Admission: EM | Admit: 2021-05-24 | Discharge: 2021-05-26 | Disposition: A | Discharge status: Home / Self Care | Payer: Commercial Managed Care - PPO | Attending: Internal Medicine | Admitting: Internal Medicine

## 2021-05-24 DIAGNOSIS — E785 Hyperlipidemia, unspecified: Secondary | ICD-10-CM | POA: Diagnosis not present

## 2021-05-24 DIAGNOSIS — R079 Chest pain, unspecified: Secondary | ICD-10-CM | POA: Diagnosis present

## 2021-05-24 DIAGNOSIS — R778 Other specified abnormalities of plasma proteins: Secondary | ICD-10-CM | POA: Diagnosis not present

## 2021-05-24 DIAGNOSIS — R001 Bradycardia, unspecified: Secondary | ICD-10-CM

## 2021-05-24 DIAGNOSIS — R7989 Other specified abnormal findings of blood chemistry: Secondary | ICD-10-CM | POA: Diagnosis present

## 2021-05-24 DIAGNOSIS — Z20822 Contact with and (suspected) exposure to covid-19: Secondary | ICD-10-CM | POA: Diagnosis not present

## 2021-05-24 DIAGNOSIS — I129 Hypertensive chronic kidney disease with stage 1 through stage 4 chronic kidney disease, or unspecified chronic kidney disease: Secondary | ICD-10-CM | POA: Diagnosis not present

## 2021-05-24 DIAGNOSIS — I25110 Atherosclerotic heart disease of native coronary artery with unstable angina pectoris: Principal | ICD-10-CM | POA: Insufficient documentation

## 2021-05-24 DIAGNOSIS — Z79899 Other long term (current) drug therapy: Secondary | ICD-10-CM | POA: Insufficient documentation

## 2021-05-24 DIAGNOSIS — N1832 Chronic kidney disease, stage 3b: Secondary | ICD-10-CM | POA: Diagnosis not present

## 2021-05-24 DIAGNOSIS — I200 Unstable angina: Secondary | ICD-10-CM

## 2021-05-24 DIAGNOSIS — I10 Essential (primary) hypertension: Secondary | ICD-10-CM | POA: Diagnosis present

## 2021-05-24 DIAGNOSIS — N183 Chronic kidney disease, stage 3 unspecified: Secondary | ICD-10-CM | POA: Diagnosis present

## 2021-05-24 HISTORY — DX: Hyperlipidemia, unspecified: E78.5

## 2021-05-24 HISTORY — DX: Chronic kidney disease, stage 3 unspecified: N18.30

## 2021-05-24 LAB — BASIC METABOLIC PANEL
Anion gap: 6 (ref 5–15)
BUN: 17 mg/dL (ref 6–20)
CO2: 26 mmol/L (ref 22–32)
Calcium: 8.9 mg/dL (ref 8.9–10.3)
Chloride: 107 mmol/L (ref 98–111)
Creatinine, Ser: 2.27 mg/dL — ABNORMAL HIGH (ref 0.61–1.24)
GFR, Estimated: 34 mL/min — ABNORMAL LOW (ref >60)
Glucose, Bld: 90 mg/dL (ref 70–99)
Potassium: 3.7 mmol/L (ref 3.5–5.1)
Sodium: 139 mmol/L (ref 135–145)

## 2021-05-24 LAB — TROPONIN I (HIGH SENSITIVITY)
Troponin I (High Sensitivity): 36 ng/L — ABNORMAL HIGH (ref <18)
Troponin I (High Sensitivity): 35 ng/L — ABNORMAL HIGH (ref <18)
Troponin I (High Sensitivity): 27 ng/L — ABNORMAL HIGH (ref <18)
Troponin I (High Sensitivity): 29 ng/L — ABNORMAL HIGH

## 2021-05-24 LAB — NM MYOCAR MULTI W/SPECT W/WALL MOTION / EF
LV dias vol: 184 mL (ref 62–150)
LV sys vol: 58 mL
Nuc Stress EF: 56 %
Peak HR: 75 {beats}/min
Percent HR: 44 %
Rest HR: 46 {beats}/min
Rest Nuclear Isotope Dose: 10.8 mCi
SDS: 0
SRS: 0
SSS: 0
ST Depression (mm): 0 mm
Stress Nuclear Isotope Dose: 32.6 mCi
TID: 1.09

## 2021-05-24 LAB — CBC
HCT: 35.4 % — ABNORMAL LOW (ref 39.0–52.0)
Hemoglobin: 11.2 g/dL — ABNORMAL LOW (ref 13.0–17.0)
MCH: 26.9 pg (ref 26.0–34.0)
MCHC: 31.6 g/dL (ref 30.0–36.0)
MCV: 84.9 fL (ref 80.0–100.0)
Platelets: 223 10*3/uL (ref 150–400)
RBC: 4.17 MIL/uL — ABNORMAL LOW (ref 4.22–5.81)
RDW: 12.8 % (ref 11.5–15.5)
WBC: 6.1 10*3/uL (ref 4.0–10.5)
nRBC: 0 % (ref 0.0–0.2)

## 2021-05-24 LAB — TSH: TSH: 1.517 u[IU]/mL (ref 0.350–4.500)

## 2021-05-24 LAB — RESP PANEL BY RT-PCR (FLU A&B, COVID) ARPGX2
Influenza A by PCR: NEGATIVE
Influenza B by PCR: NEGATIVE
SARS Coronavirus 2 by RT PCR: NEGATIVE

## 2021-05-24 LAB — URINE DRUG SCREEN, QUALITATIVE (ARMC ONLY)
Amphetamines, Ur Screen: NOT DETECTED
Barbiturates, Ur Screen: NOT DETECTED
Benzodiazepine, Ur Scrn: NOT DETECTED
Cannabinoid 50 Ng, Ur ~~LOC~~: NOT DETECTED
Cocaine Metabolite,Ur ~~LOC~~: NOT DETECTED
MDMA (Ecstasy)Ur Screen: NOT DETECTED
Methadone Scn, Ur: NOT DETECTED
Opiate, Ur Screen: NOT DETECTED
Phencyclidine (PCP) Ur S: NOT DETECTED
Tricyclic, Ur Screen: NOT DETECTED

## 2021-05-24 MED ORDER — REGADENOSON 0.4 MG/5ML IV SOLN
0.4000 mg | Freq: Once | INTRAVENOUS | Status: AC
Start: 2021-05-24 — End: 2021-05-24
  Administered 2021-05-24: 0.4 mg via INTRAVENOUS

## 2021-05-24 MED ORDER — HYDRALAZINE HCL 20 MG/ML IJ SOLN: 5.0000 mg | INTRAMUSCULAR | Status: DC | PRN

## 2021-05-24 MED ORDER — ONDANSETRON HCL 4 MG/2ML IJ SOLN
4.0000 mg | Freq: Three times a day (TID) | INTRAMUSCULAR | Status: DC | PRN
Start: 2021-05-24 — End: 2021-05-26
  Administered 2021-05-26: 4 mg via INTRAVENOUS
  Filled 2021-05-24: qty 2

## 2021-05-24 MED ORDER — NITROGLYCERIN 0.4 MG SL SUBL
0.4000 mg | SUBLINGUAL_TABLET | SUBLINGUAL | Status: DC | PRN
Start: 2021-05-24 — End: 2021-05-26
  Administered 2021-05-24 (×3): 0.4 mg via SUBLINGUAL
  Filled 2021-05-24: qty 1

## 2021-05-24 MED ORDER — ENOXAPARIN SODIUM 40 MG/0.4ML IJ SOSY
40.0000 mg | PREFILLED_SYRINGE | INTRAMUSCULAR | Status: DC
  Administered 2021-05-24: 40 mg via SUBCUTANEOUS
  Filled 2021-05-24: qty 0.4

## 2021-05-24 MED ORDER — TECHNETIUM TC 99M TETROFOSMIN IV KIT
10.0000 | PACK | Freq: Once | INTRAVENOUS | Status: AC | PRN
  Administered 2021-05-24: 10.75 via INTRAVENOUS
  Filled 2021-05-24: qty 10

## 2021-05-24 MED ORDER — MORPHINE SULFATE (PF) 2 MG/ML IV SOLN
2.0000 mg | INTRAVENOUS | Status: DC | PRN
  Administered 2021-05-25: 2 mg via INTRAVENOUS
  Filled 2021-05-24: qty 1

## 2021-05-24 MED ORDER — ASPIRIN EC 81 MG PO TBEC
81.0000 mg | DELAYED_RELEASE_TABLET | Freq: Every day | ORAL | Status: DC
Start: 2021-05-25 — End: 2021-05-26
  Administered 2021-05-25 – 2021-05-26 (×2): 81 mg via ORAL
  Filled 2021-05-24 (×2): qty 1

## 2021-05-24 MED ORDER — ACETAMINOPHEN 650 MG RE SUPP: 650.0000 mg | Freq: Four times a day (QID) | RECTAL | Status: DC | PRN

## 2021-05-24 MED ORDER — ATORVASTATIN CALCIUM 20 MG PO TABS
40.0000 mg | ORAL_TABLET | Freq: Every day | ORAL | Status: DC
  Administered 2021-05-24 – 2021-05-26 (×3): 40 mg via ORAL
  Filled 2021-05-24 (×3): qty 2

## 2021-05-24 MED ORDER — ISOSORBIDE MONONITRATE ER 30 MG PO TB24
30.0000 mg | ORAL_TABLET | Freq: Every day | ORAL | Status: DC
  Administered 2021-05-24 – 2021-05-26 (×3): 30 mg via ORAL
  Filled 2021-05-24 (×3): qty 1

## 2021-05-24 MED ORDER — ASPIRIN 81 MG PO CHEW
324.0000 mg | CHEWABLE_TABLET | Freq: Once | ORAL | Status: AC
  Administered 2021-05-24: 324 mg via ORAL
  Filled 2021-05-24: qty 4

## 2021-05-24 MED ORDER — TECHNETIUM TC 99M TETROFOSMIN IV KIT
30.0000 | PACK | Freq: Once | INTRAVENOUS | Status: AC | PRN
  Administered 2021-05-24: 32.6 via INTRAVENOUS
  Filled 2021-05-24: qty 30

## 2021-05-24 MED ORDER — AMLODIPINE BESYLATE 10 MG PO TABS
10.0000 mg | ORAL_TABLET | Freq: Every day | ORAL | Status: DC
  Administered 2021-05-24 – 2021-05-26 (×3): 10 mg via ORAL
  Filled 2021-05-24: qty 1
  Filled 2021-05-24: qty 2
  Filled 2021-05-24: qty 1

## 2021-05-24 MED ORDER — ACETAMINOPHEN 500 MG PO TABS
1000.0000 mg | ORAL_TABLET | Freq: Once | ORAL | Status: AC | PRN
  Administered 2021-05-24: 1000 mg via ORAL
  Filled 2021-05-24: qty 2

## 2021-05-24 MED ORDER — SODIUM CHLORIDE 0.9 % IV SOLN: INTRAVENOUS | Status: DC

## 2021-05-24 MED ORDER — ACETAMINOPHEN 325 MG PO TABS
650.0000 mg | ORAL_TABLET | Freq: Four times a day (QID) | ORAL | Status: DC | PRN
  Administered 2021-05-24 – 2021-05-25 (×2): 650 mg via ORAL
  Filled 2021-05-24: qty 2

## 2021-05-24 NOTE — ED Notes (Addendum)
Pt taken to nucmed at this time.

## 2021-05-24 NOTE — Progress Notes (Signed)
°  Carryover admission to the Day Admitter.  I discussed this case with the EDP, Dr. Scotty Court.  Per these discussions:   This is a 56 year old male with history of hypertension, who presents to the ED this evening complaining of 1 day of substernal chest pressure radiating into his left arm, worse with exertion.  Has not attempted any nitroglycerin thus far.  Work-up thus far notable for the following: Initial high-sensitivity troponin I 36, with repeat value trending down to 35.  EKG reportedly shows sinus rhythm without evidence of acute ischemic changes, including no evidence of ST elevation.  Chest x-ray reportedly shows no evidence of acute cardiopulmonary process.  Patient has received full dose aspirin x1.  He is about to receive sublingual nitroglycerin to see how he responds to this medication.  I have placed order for observation to cardiac telemetry for further evaluation and management of presenting chest pain. I have placed some additional preliminary admit orders via the adult multi-morbid admission order set.  Have ordered next Troponin to be checked at 9 AM.    Newton Pigg, DO Hospitalist

## 2021-05-24 NOTE — Consult Note (Signed)
Cardiology Consultation:   Patient ID: John Gregory; [id number]; September 18, 1965   Admit date: 05/24/2021 Date of Consult: 05/24/2021  Primary Care Provider: Letta Median, MD Primary Cardiologist: New to [facility] - consult by End Primary Electrophysiologist:  None   Patient Profile:   John Gregory is a 56 y.o. male with a hx of polycystic kidney disease with CKD stage IIIb, HTN, HLD, nephrolithiasis, and arthritis who is being seen today for the evaluation of chest pain and elevated troponin at the request of Dr. Blaine Hamper.  History of Present Illness:   John Gregory has no previously known cardiac history. He was recently chopping some wood, and following this he developed brief intermittent chest discomfort that would spontaneously resolve. 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 No associated symptoms. Pain would improve with rest. At it's worst, the pain was rate 7/10. Due to persistent symptoms he presented to the ED.   Upon his presentation to the ED, vitals were stable with noted bradycardia at times with appropriate chronotropic competence noted. EKG demonstrated sinus rhythm with LVH and no acute st/t changes. High sensitivity troponin 36 with a delta troponin of 35. BUN/SCr 17/2.27 with a baseline around 2-2.5. HGB 11.2. CXR without acute cardiopulmonary process. In the ED, he continued to note chest pain rated 5/10 that improved with 1 SL NTG early this morning. Currently, he does continue to note 5/10 chest pain. At baseline, he is very active and practices martial arts.    Past Medical History:  Diagnosis Date   Arthritis    hands, shoulders   Chronic kidney disease (CKD), stage IIIb    HLD (hyperlipidemia)    Hypertension    Kidney stone    Kidney stone    Kidney stones    Polycystic  kidney disease    Renal disorder    Wears contact lenses    Wears dentures    partial upper and lower    Past Surgical History:  Procedure Laterality Date   ANKLE SURGERY Left    EXCISION MASS NECK Left 10/31/2017   Procedure: EXCISION MASS NECK;  Surgeon: Carloyn Manner, MD;  Location: [facility];  Service: ENT;  Laterality: Left;   INGUINAL HERNIA REPAIR Bilateral 07/25/2019   Procedure: HERNIA REPAIR INGUINAL ADULT;  Surgeon: Fredirick Maudlin, MD;  Location: [facility];  Service: General;  Laterality: Bilateral;   KNEE SURGERY Bilateral    SHOULDER SURGERY Left    WRIST SURGERY Left      Home Meds: Prior to Admission medications   Medication Sig Start Date End Date Taking? Authorizing Provider  amLODipine (NORVASC) 10 MG tablet Take 1 tablet (10 mg total) by mouth daily. 02/11/20  Yes Lavonia Drafts, MD  benazepril-hydrochlorthiazide (LOTENSIN HCT) 20-12.5 MG tablet Take 2 tablets by mouth daily. 02/11/20  Yes Lavonia Drafts, MD    Inpatient Medications: Scheduled Meds:  [START ON 05/25/2021] aspirin EC  81 mg Oral Daily   enoxaparin (LOVENOX) injection  40 mg Subcutaneous Q24H   Continuous Infusions:  PRN Meds: acetaminophen **OR** acetaminophen, hydrALAZINE, morphine injection, nitroGLYCERIN, ondansetron (ZOFRAN) IV  Allergies:   Allergies  Allergen Reactions   Bactrim [Sulfamethoxazole-Trimethoprim] Itching   Calcium Other (See Comments)    Kidney stones   Penicillins Swelling    angioedema    Social History:  Social History   Socioeconomic History   Marital status: Married    Spouse name: Not on file   Number of children: Not on file   Years of education: Not on file   Highest education level: Not on file  Occupational History   Not on file  Tobacco Use   Smoking status: Never   Smokeless tobacco: Current    Types: Snuff  Vaping Use   Vaping Use: Never used  Substance and Sexual Activity   Alcohol use: No   Drug use: No   Sexual activity:  Not on file  Other Topics Concern   Not on file  Social History Narrative   Not on file   Social Determinants of Health   Financial Resource Strain: Not on file  Food Insecurity: Not on file  Transportation Needs: Not on file  Physical Activity: Not on file  Stress: Not on file  Social Connections: Not on file  Intimate Partner Violence: Not on file     Family History:   Family History  Problem Relation Age of Onset   Polycystic kidney disease Mother    Polycystic kidney disease Sister     ROS:  Review of Systems  Constitutional:  Positive for malaise/fatigue. Negative for chills, diaphoresis, fever and weight loss.  HENT:  Negative for congestion.   Eyes:  Negative for discharge and redness.  Respiratory:  Negative for cough, sputum production, shortness of breath and wheezing.   Cardiovascular:  Positive for chest pain. Negative for palpitations, orthopnea, claudication, leg swelling and PND.  Gastrointestinal:  Negative for abdominal pain, heartburn, nausea and vomiting.  Musculoskeletal:  Positive for back pain. Negative for falls and myalgias.  Skin:  Negative for rash.  Neurological:  Negative for dizziness, tingling, tremors, sensory change, speech change, focal weakness, loss of consciousness and weakness.  Endo/Heme/Allergies:  Does not bruise/bleed easily.  Psychiatric/Behavioral:  Negative for substance abuse. The patient is not nervous/anxious.   All other systems reviewed and are negative.    Physical Exam/Data:   Vitals:   05/24/21 0530 05/24/21 0600 05/24/21 0630 05/24/21 0700  BP: (!) 160/99 (!) 139/95 (!) 135/95 (!) 128/92  Pulse: (!) 55 (!) 48 (!) 46 (!) 48  Resp: 13 12 14 14   Temp:      TempSrc:      SpO2: 100% 100% 100% 97%  Weight:      Height:        Intake/Output Summary (Last 24 hours) at 05/24/2021 1030 Last data filed at 05/24/2021 0448 Gross per 24 hour  Intake --  Output 600 ml  Net -600 ml   Filed Weights   05/24/21 0034   Weight: 65.8 kg   Body mass index is 20.81 kg/m.   Physical Exam: General: Well developed, well nourished, in no acute distress. Head: Normocephalic, atraumatic, sclera non-icteric, no xanthomas, nares without discharge.  Neck: Negative for carotid bruits. JVD not elevated. Lungs: Clear bilaterally to auscultation without wheezes, rales, or rhonchi. Breathing is unlabored. Heart: RRR with S1 S2. No murmurs, rubs, or gallops appreciated. Abdomen: Soft, non-tender, non-distended with normoactive bowel sounds. No hepatomegaly. No rebound/guarding. No obvious abdominal masses. Msk:  Strength and tone appear normal for age. Extremities: No clubbing or cyanosis. No edema. Distal pedal pulses are 2+ and equal bilaterally. Neuro: Alert and oriented X 3. No facial asymmetry. No focal deficit. Moves all extremities spontaneously. Psych:  Responds to questions appropriately with a normal affect.   EKG:  The EKG was personally reviewed  and demonstrates: NSR, 62 bpm, LVH, no acute st/t changes Telemetry:  Telemetry was personally reviewed and demonstrates: sinus bradycardia, 40s to 50s bpm  Weights: [employer]   05/24/21 0034  Weight: 65.8 kg    Relevant CV Studies: None  Laboratory Data:  Chemistry Recent Labs  Lab 05/24/21 0045  NA 139  K 3.7  CL 107  CO2 26  GLUCOSE 90  BUN 17  CREATININE 2.27*  CALCIUM 8.9  GFRNONAA 34*  ANIONGAP 6    No results for input(s): PROT, ALBUMIN, AST, ALT, ALKPHOS, BILITOT in the last 168 hours. Hematology Recent Labs  Lab 05/24/21 0045  WBC 6.1  RBC 4.17*  HGB 11.2*  HCT 35.4*  MCV 84.9  MCH 26.9  MCHC 31.6  RDW 12.8  PLT 223   Cardiac EnzymesNo results for input(s): TROPONINI in the last 168 hours. No results for input(s): TROPIPOC in the last 168 hours.  BNPNo results for input(s): BNP, PROBNP in the last 168 hours.  DDimer No results for input(s): DDIMER in the last 168 hours.  Radiology/Studies:  DG Chest 2 View  Result  Date: 05/24/2021 IMPRESSION: No active cardiopulmonary disease. Electronically Signed   By: Anner Crete M.D.   On: 05/24/2021 01:07    Assessment and Plan:   1. Chest pain with moderate risk for cardiac etiology with mildly elevated high sensitivity troponin: -Start Imdur 30 mg -ASA 81 mg -No indication for heparin gtt at this time without dynamic troponin elevation -NPO -Plan for Lexiscan MPI today to evaluate for high risk ischemia, especially in the context of his underlying renal dysfunction in an effort to minimize risk of permanent progressive of his CKD -Await lipid panel and A1c for further risk stratification   2. Sinus bradycardia: -Has demonstrated appropriate chronotropic response -Check TSH -Avoid AV nodal blocking medications  3. Polycystic kidney disease with CKD stage IIIb: -Stable -Monitor -If LHC is needed, will need hydration    Shared Decision Making/Informed Consent{  The risks [chest pain, shortness of breath, cardiac arrhythmias, dizziness, blood pressure fluctuations, myocardial infarction, stroke/transient ischemic attack, nausea, vomiting, allergic reaction, radiation exposure, metallic taste sensation and life-threatening complications (estimated to be 1 in 10,000)], benefits (risk stratification, diagnosing coronary artery disease, treatment guidance) and alternatives of a nuclear stress test were discussed in detail with Mr. Saye and he agrees to proceed.    For questions or updates, please contact [place] Please consult [email or web address] for contact info under Cardiology/STEMI.   Signed, Christell Faith, PA-C [place] Pager: [phone] 05/24/2021, 10:30 AM

## 2021-05-24 NOTE — ED Provider Notes (Signed)
[facility] Provider Note    Event Date/Time   First MD Initiated Contact with Patient 05/24/21 [phone]     (approximate)   History   Chest Pain   HPI  Jewett Santanna is a 56 y.o. male with a past history of kidney stones, polycystic kidney disease, hypertension who comes ED complaining of chest pain that started this evening.  It is located in the left chest, radiates to the neck and left arm.  Worse with exertion, better with rest.  No shortness of breath.  It is associated with diaphoresis.  No vomiting.  Currently pain is about 5/10 in intensity, feels dull.     Physical Exam   Triage Vital Signs: ED Triage Vitals  Enc Vitals Group     BP 05/24/21 0046 (!) 156/85     Pulse Rate 05/24/21 0037 60     Resp 05/24/21 0037 20     Temp 05/24/21 0037 99.1 F (37.3 C)     Temp Source 05/24/21 0037 Oral     SpO2 05/24/21 0037 100 %     Weight 05/24/21 0034 145 lb (65.8 kg)     Height 05/24/21 0034 5\' 10"  (1.778 m)     Head Circumference --      Peak Flow --      Pain Score 05/24/21 0034 6     Pain Loc --      Pain Edu? --      Excl. in [place]? --     Most recent vital signs: Vitals:   05/24/21 0530 05/24/21 0600  BP: (!) 160/99 (!) 139/95  Pulse: (!) 55 (!) 48  Resp: 13 12  Temp:    SpO2: 100% 100%     General: Awake, no distress.  CV:  Good peripheral perfusion.  Regular rate and rhythm Resp:  Normal effort.  Clear to auscultation bilaterally Abd:  No distention.  Soft and nontender. Other:  No lower extremity edema or calf swelling or circumference.     ED Results / Procedures / Treatments   Labs (all labs ordered are listed, but only abnormal results are displayed) Labs Reviewed  BASIC METABOLIC PANEL - Abnormal; Notable for the following components:      Result Value   Creatinine, Ser 2.27 (*)    GFR, Estimated 34 (*)    All other components within normal limits  CBC - Abnormal; Notable for the following components:   RBC 4.17  (*)    Hemoglobin 11.2 (*)    HCT 35.4 (*)    All other components within normal limits  TROPONIN I (HIGH SENSITIVITY) - Abnormal; Notable for the following components:   Troponin I (High Sensitivity) 36 (*)    All other components within normal limits  TROPONIN I (HIGH SENSITIVITY) - Abnormal; Notable for the following components:   Troponin I (High Sensitivity) 35 (*)    All other components within normal limits  RESP PANEL BY RT-PCR (FLU A&B, COVID) [id number]     EKG  Interpreted by me Normal sinus rhythm rate of 62.  Right axis.  Normal intervals.  Normal QRS ST segments and T waves.   RADIOLOGY Chest x-ray viewed and interpreted by me, appears unremarkable.  Radiology report reviewed    PROCEDURES:  Critical Care performed: No  Procedures   MEDICATIONS ORDERED IN ED: Medications  nitroGLYCERIN (NITROSTAT) SL tablet 0.4 mg (0.4 mg Sublingual Given 05/24/21 0500)  aspirin chewable tablet 324 mg (324 mg Oral Given 05/24/21  0448)  acetaminophen (TYLENOL) tablet 1,000 mg (1,000 mg Oral Given 05/24/21 0448)     IMPRESSION / MDM / ASSESSMENT AND PLAN / ED COURSE  I reviewed the triage vital signs and the nursing notes.                              Differential diagnosis includes, but is not limited to, non-STEMI, pneumothorax, pleural effusion, pulmonary edema, pneumonia   Patient presents with chest pain syndrome which is somewhat concerning for cardiac etiology.  Vital signs are unremarkable.  Not in distress.  Labs show baseline CKD, COVID and flu negative.  Serial troponins are flat at 35.  No prior labs to compare to for baseline.  Given the patient's age, comorbidities, exertional chest pain, I recommended hospitalization for further cardiac evaluation, to which patient agrees.  Will give aspirin, nitroglycerin.  Case discussed with hospitalist.      FINAL CLINICAL IMPRESSION(S) / ED DIAGNOSES   Final diagnoses:  Chest pain with moderate risk for cardiac  etiology     Rx / DC Orders   ED Discharge Orders     None        Note:  This document was prepared using Dragon voice recognition software and may include unintentional dictation errors.   Carrie Mew, MD 05/24/21 [phone]

## 2021-05-24 NOTE — ED Notes (Signed)
Patient to Nuc Med for stress test at this time.

## 2021-05-24 NOTE — H&P (Signed)
History and Physical    John Gregory [id number] DOB: Aug 17, 1965 DOA: 05/24/2021  Referring MD/NP/PA:   PCP: Letta Median, MD   Patient coming from:  The patient is coming from home.  At baseline, pt is independent for most of ADL.        Chief Complaint: chest pain  HPI: John Gregory is a 56 y.o. male with medical history significant of polycystic kidney disease, kidney stone, CKD-3B, bilateral inguinal hernia, hypertension, hyperlipidemia, who presents with chest pain.  Patient states that his chest pain has been going on for several days, which has worsened since yesterday evening.  It is located in the left side of chest, 6 out of 10 in severity, sharp, radiating to the neck and left arm.  It is exertional, worse on exertion, better with rest. He has dry cough, no shortness of breath.  No fever or chills.  Denies nausea, vomiting, diarrhea or abdominal pain.  No symptoms of UTI.  Data Reviewed and ED Course: pt was found to have troponin level 36 --> 35, WBC 6.1, negative COVID PCR, stable renal function, temperature 99.1, blood pressure 128/92, heart rate 98 --> 48, RR 20, oxygen saturation 97% on room air.  Chest x-ray negative.  Patient is placed on telemetry bed for position, Dr. Saunders Revel of of cardiology is consulted   EKG: I have personally reviewed.  Sinus rhythm, QTc 401, RAD, poor R wave progression   Review of Systems:   General: no fevers, chills, no body weight gain, has fatigue HEENT: no blurry vision, hearing changes or sore throat Respiratory: no dyspnea, has dry coughing, no wheezing CV: has chest pain, no palpitations GI: no nausea, vomiting, abdominal pain, diarrhea, constipation GU: no dysuria, burning on urination, increased urinary frequency, hematuria  Ext: no leg edema Neuro: no unilateral weakness, numbness, or tingling, no vision change or hearing loss Skin: no rash, no skin tear. MSK: No muscle spasm, no deformity, no limitation of  range of movement in spin Heme: No easy bruising.  Travel history: No recent long distant travel.   Allergy:  Allergies  Allergen Reactions   Bactrim [Sulfamethoxazole-Trimethoprim] Itching   Calcium Other (See Comments)    Kidney stones   Penicillins Swelling    angioedema    Past Medical History:  Diagnosis Date   Arthritis    hands, shoulders   Chronic kidney disease (CKD), stage IIIb    HLD (hyperlipidemia)    Hypertension    Kidney stone    Kidney stone    Kidney stones    Polycystic kidney disease    Renal disorder    Wears contact lenses    Wears dentures    partial upper and lower    Past Surgical History:  Procedure Laterality Date   ANKLE SURGERY Left    EXCISION MASS NECK Left 10/31/2017   Procedure: EXCISION MASS NECK;  Surgeon: Carloyn Manner, MD;  Location: [facility];  Service: ENT;  Laterality: Left;   INGUINAL HERNIA REPAIR Bilateral 07/25/2019   Procedure: HERNIA REPAIR INGUINAL ADULT;  Surgeon: Fredirick Maudlin, MD;  Location: [facility];  Service: General;  Laterality: Bilateral;   KNEE SURGERY Bilateral    SHOULDER SURGERY Left    WRIST SURGERY Left     Social History:  reports that he has never smoked. His smokeless tobacco use includes snuff. He reports that he does not drink alcohol and does not use drugs.  Family History:  Family History  Problem Relation Age of  Onset   Polycystic kidney disease Mother    Polycystic kidney disease Sister      Prior to Admission medications   Medication Sig Start Date End Date Taking? Authorizing Provider  acetaminophen (TYLENOL) 500 MG tablet Take 2 tablets (1,000 mg total) by mouth every 6 (six) hours as needed for up to 30 doses for mild pain, moderate pain, fever or headache. Patient not taking: Reported on 08/14/2019 07/25/19   Fredirick Maudlin, MD  amLODipine (NORVASC) 10 MG tablet Take 1 tablet (10 mg total) by mouth daily. 02/11/20   Lavonia Drafts, MD  atorvastatin (LIPITOR) 80 MG tablet  Take 80 mg by mouth daily. 07/22/19   [provider]  benazepril-hydrochlorthiazide (LOTENSIN HCT) 20-12.5 MG tablet Take 2 tablets by mouth daily. 02/11/20   Lavonia Drafts, MD  betamethasone valerate ointment (VALISONE) 0.1 %  08/13/19   [provider]  bisacodyl (DULCOLAX) 10 MG suppository Place 1 suppository (10 mg total) rectally as needed for moderate constipation. 02/11/20   Lavonia Drafts, MD  gabapentin (NEURONTIN) 300 MG capsule Take 1 capsule (300 mg total) by mouth 3 (three) times daily. 08/20/19   Pabon, Marjory Lies, MD  HYDROcodone-acetaminophen (NORCO/VICODIN) 5-325 MG tablet Take 1 tablet by mouth every 4 (four) hours as needed. 11/07/20   Ward, Delice Bison, DO  ibuprofen (ADVIL) 800 MG tablet Take 800 mg by mouth every 8 (eight) hours as needed. 07/25/19   [provider]  lisinopril (ZESTRIL) 40 MG tablet Take 40 mg by mouth daily. 08/13/19   [provider]  LUMIGAN 0.01 % SOLN Place 1 drop into both eyes at bedtime.  07/06/19   [provider]  ondansetron (ZOFRAN ODT) 4 MG disintegrating tablet Take 1 tablet (4 mg total) by mouth every 6 (six) hours as needed for nausea or vomiting. 11/07/20   Ward, Delice Bison, DO  oxyCODONE (OXY IR/ROXICODONE) 5 MG immediate release tablet Take 1 tablet (5 mg total) by mouth every 6 (six) hours as needed for severe pain. Patient not taking: Reported on 08/14/2019 07/25/19   Fredirick Maudlin, MD  oxyCODONE-acetaminophen (PERCOCET) 5-325 MG tablet Take 1 tablet by mouth every 8 (eight) hours as needed. 08/01/19   Earleen Newport, MD  polyethylene glycol (MIRALAX) 17 g packet Take 17 g by mouth daily. 02/11/20   Lavonia Drafts, MD  senna (SENOKOT) 8.6 MG TABS tablet Take 2 tablets (17.2 mg total) by mouth at bedtime. Patient not taking: Reported on 08/14/2019 08/01/19   Earleen Newport, MD    Physical Exam: Vitals:   05/24/21 0530 05/24/21 0600 05/24/21 0630 05/24/21 0700  BP: (!) 160/99 (!) 139/95 (!)  135/95 (!) 128/92  Pulse: (!) 55 (!) 48 (!) 46 (!) 48  Resp: 13 12 14 14   Temp:      TempSrc:      SpO2: 100% 100% 100% 97%  Weight:      Height:       General: Not in acute distress HEENT:       Eyes: PERRL, EOMI, no scleral icterus.       ENT: No discharge from the ears and nose, no pharynx injection, no tonsillar enlargement.        Neck: No JVD, no bruit, no mass felt. Heme: No neck lymph node enlargement. Cardiac: S1/S2, RRR, No murmurs, No gallops or rubs. Respiratory: No rales, wheezing, rhonchi or rubs. GI: Soft, nondistended, nontender, no rebound pain, no organomegaly, BS present. GU: No hematuria Ext: No pitting leg edema bilaterally.  1+DP/PT pulse bilaterally. Musculoskeletal: No joint deformities, No joint redness or warmth, no limitation of ROM in spin. Skin: No rashes.  Neuro: Alert, oriented X3, cranial nerves II-XII grossly intact, moves all extremities normally. Psych: Patient is not psychotic, no suicidal or hemocidal ideation.  Labs on Admission: I have personally reviewed following labs and imaging studies  CBC: Recent Labs  Lab 05/24/21 0045  WBC 6.1  HGB 11.2*  HCT 35.4*  MCV 84.9  PLT [id number]   Basic Metabolic Panel: Recent Labs  Lab 05/24/21 0045  NA 139  K 3.7  CL 107  CO2 26  GLUCOSE 90  BUN 17  CREATININE 2.27*  CALCIUM 8.9   GFR: Estimated Creatinine Clearance: 35 mL/min (A) (by C-G formula based on SCr of 2.27 mg/dL (H)). Liver Function Tests: No results for input(s): AST, ALT, ALKPHOS, BILITOT, PROT, ALBUMIN in the last 168 hours. No results for input(s): LIPASE, AMYLASE in the last 168 hours. No results for input(s): AMMONIA in the last 168 hours. Coagulation Profile: No results for input(s): INR, PROTIME in the last 168 hours. Cardiac Enzymes: No results for input(s): CKTOTAL, CKMB, CKMBINDEX, TROPONINI in the last 168 hours. BNP (last 3 results) No results for input(s): PROBNP in the last 8760 hours. HbA1C: No results for  input(s): HGBA1C in the last 72 hours. CBG: No results for input(s): GLUCAP in the last 168 hours. Lipid Profile: No results for input(s): CHOL, HDL, LDLCALC, TRIG, CHOLHDL, LDLDIRECT in the last 72 hours. Thyroid Function Tests: Recent Labs    05/24/21 0045  TSH 1.517   Anemia Panel: No results for input(s): VITAMINB12, FOLATE, FERRITIN, TIBC, IRON, RETICCTPCT in the last 72 hours. Urine analysis:    Component Value Date/Time   COLORURINE COLORLESS (A) 11/06/2020 2308   APPEARANCEUR CLEAR (A) 11/06/2020 2308   LABSPEC 1.003 (L) 11/06/2020 2308   PHURINE 7.0 11/06/2020 2308   GLUCOSEU NEGATIVE 11/06/2020 2308   HGBUR NEGATIVE 11/06/2020 2308   BILIRUBINUR NEGATIVE 11/06/2020 2308   KETONESUR NEGATIVE 11/06/2020 2308   PROTEINUR NEGATIVE 11/06/2020 2308   NITRITE NEGATIVE 11/06/2020 2308   LEUKOCYTESUR NEGATIVE 11/06/2020 2308   Sepsis Labs: @LABRCNTIP (procalcitonin:4,lacticidven:4) ) Recent Results (from the past 240 hour(s))  Resp Panel by RT-PCR (Flu A&B, Covid) Nasopharyngeal Swab     Status: None   Collection Time: 05/24/21  4:25 AM   Specimen: Nasopharyngeal Swab; Nasopharyngeal(NP) swabs in vial transport medium  Result Value Ref Range Status   SARS Coronavirus 2 by RT PCR NEGATIVE NEGATIVE Final    Comment: (NOTE) SARS-CoV-2 target nucleic acids are NOT DETECTED.  The SARS-CoV-2 RNA is generally detectable in upper respiratory specimens during the acute phase of infection. The lowest concentration of SARS-CoV-2 viral copies this assay can detect is 138 copies/mL. A negative result does not preclude SARS-Cov-2 infection and should not be used as the sole basis for treatment or other patient management decisions. A negative result may occur with  improper specimen collection/handling, submission of specimen other than nasopharyngeal swab, presence of viral mutation(s) within the areas targeted by this assay, and inadequate number of viral copies(<138 copies/mL).  A negative result must be combined with clinical observations, patient history, and epidemiological information. The expected result is Negative.  Fact Sheet for Patients:  EntrepreneurPulse.com.au  Fact Sheet for Healthcare Providers:  IncredibleEmployment.be  This test is no t yet approved or cleared by the [place] FDA and  has been authorized for detection and/or diagnosis of SARS-CoV-2 by FDA under an Emergency Use Authorization (  EUA). This EUA will remain  in effect (meaning this test can be used) for the duration of the COVID-19 declaration under Section 564(b)(1) of the Act, 21 U.S.C.section 360bbb-3(b)(1), unless the authorization is terminated  or revoked sooner.       Influenza A by PCR NEGATIVE NEGATIVE Final   Influenza B by PCR NEGATIVE NEGATIVE Final    Comment: (NOTE) The Xpert Xpress SARS-CoV-2/FLU/RSV plus assay is intended as an aid in the diagnosis of influenza from Nasopharyngeal swab specimens and should not be used as a sole basis for treatment. Nasal washings and aspirates are unacceptable for Xpert Xpress SARS-CoV-2/FLU/RSV testing.  Fact Sheet for Patients: EntrepreneurPulse.com.au  Fact Sheet for Healthcare Providers: IncredibleEmployment.be  This test is not yet approved or cleared by the [place] FDA and has been authorized for detection and/or diagnosis of SARS-CoV-2 by FDA under an Emergency Use Authorization (EUA). This EUA will remain in effect (meaning this test can be used) for the duration of the COVID-19 declaration under Section 564(b)(1) of the Act, 21 U.S.C. section 360bbb-3(b)(1), unless the authorization is terminated or revoked.  Performed at [facility], [address] [place], [place] [address]      Radiological Exams on Admission: DG Chest 2 View  Result Date: 05/24/2021 CLINICAL DATA:  Chest pain. EXAM: CHEST - 2 VIEW COMPARISON:   Chest radiograph dated 09/05/2018. FINDINGS: No focal consolidation, pleural effusion, pneumothorax. The cardiac silhouette is within limits. Scoliosis. No acute osseous pathology. IMPRESSION: No active cardiopulmonary disease. Electronically Signed   By: Anner Crete M.D.   On: 05/24/2021 01:07      Assessment/Plan Principal Problem:   Chest pain Active Problems:   HLD (hyperlipidemia)   HTN (hypertension)   Chronic kidney disease (CKD), stage IIIb   Elevated troponin   Chest pain and elevated trop: Patient has exertional chest pain, troponin level 36 --> 35.  Cardiology is consulted, will do Myoview stress test.  - place to tele bed for observation - - Trend Trop - Repeat EKG in the am  - prn Nitroglycerin, Morphine - Aspirin, lipitor, imdur - Risk factor stratification: will check FLP and A1C  - check UDS  HLD (hyperlipidemia) - start lipitor 40 mg daily -f/u FLP  HTN (hypertension) -IV hydralazine as needed -Continue amlodipine -Patient's not sure if he is taking Lotensin/HCTZ or not  Chronic kidney disease (CKD), stage IIIb: Close to baseline.  Recent baseline creatinine 2.0-2.5.  Her creatinine is at 2.27, BUN 17. -f/u with BMP      DVT ppx: SQ Lovenox  Code Status: Full code  Family Communication: not done, no family member is at bed side.      Disposition Plan:  Anticipate discharge back to previous environment  Consults called: Dr. Saunders Revel of Card  Admission status and Level of care: Telemetry Medical:    for obs     Severity of Illness:  The appropriate patient status for this patient is OBSERVATION. Observation status is judged to be reasonable and necessary in order to provide the required intensity of service to ensure the patient's safety. The patient's presenting symptoms, physical exam findings, and initial radiographic and laboratory data in the context of their medical condition is felt to place them at decreased risk for further clinical  deterioration. Furthermore, it is anticipated that the patient will be medically stable for discharge from the hospital within 2 midnights of admission.        Date of Service 05/24/2021    Ivor Costa  [facility]   If 7PM-7AM, please contact night-coverage [email or web address] 05/24/2021, 12:34 PM

## 2021-05-24 NOTE — ED Triage Notes (Incomplete)
Pt has left side chest pain for 1 week.  Pain worse tonight.  No n/v/  pain radiates into left arm.  Pt alert  speech clear.

## 2021-05-25 ENCOUNTER — Observation Stay (HOSPITAL_BASED_OUTPATIENT_CLINIC_OR_DEPARTMENT_OTHER)
Admit: 2021-05-25 | Discharge: 2021-05-25 | Disposition: A | Discharge status: Home / Self Care | Payer: Commercial Managed Care - PPO | Attending: Physician Assistant | Admitting: Physician Assistant

## 2021-05-25 ENCOUNTER — Encounter
Admission: EM | Disposition: A | Discharge status: Home / Self Care | Payer: Self-pay | Source: Home / Self Care | Attending: Emergency Medicine

## 2021-05-25 ENCOUNTER — Encounter: Payer: Self-pay | Admitting: Internal Medicine

## 2021-05-25 DIAGNOSIS — I25110 Atherosclerotic heart disease of native coronary artery with unstable angina pectoris: Secondary | ICD-10-CM | POA: Diagnosis not present

## 2021-05-25 DIAGNOSIS — N1832 Chronic kidney disease, stage 3b: Secondary | ICD-10-CM | POA: Diagnosis not present

## 2021-05-25 DIAGNOSIS — R072 Precordial pain: Secondary | ICD-10-CM

## 2021-05-25 DIAGNOSIS — E785 Hyperlipidemia, unspecified: Secondary | ICD-10-CM | POA: Diagnosis not present

## 2021-05-25 DIAGNOSIS — N183 Chronic kidney disease, stage 3 unspecified: Secondary | ICD-10-CM

## 2021-05-25 DIAGNOSIS — R079 Chest pain, unspecified: Secondary | ICD-10-CM

## 2021-05-25 DIAGNOSIS — I10 Essential (primary) hypertension: Secondary | ICD-10-CM | POA: Diagnosis not present

## 2021-05-25 DIAGNOSIS — I200 Unstable angina: Secondary | ICD-10-CM

## 2021-05-25 DIAGNOSIS — R778 Other specified abnormalities of plasma proteins: Secondary | ICD-10-CM | POA: Diagnosis not present

## 2021-05-25 HISTORY — PX: LEFT HEART CATH AND CORONARY ANGIOGRAPHY: CATH118249

## 2021-05-25 LAB — BASIC METABOLIC PANEL
Anion gap: 6 (ref 5–15)
BUN: 21 mg/dL — ABNORMAL HIGH (ref 6–20)
CO2: 25 mmol/L (ref 22–32)
Calcium: 8.2 mg/dL — ABNORMAL LOW (ref 8.9–10.3)
Chloride: 107 mmol/L (ref 98–111)
Creatinine, Ser: 2.51 mg/dL — ABNORMAL HIGH (ref 0.61–1.24)
GFR, Estimated: 30 mL/min — ABNORMAL LOW (ref >60)
Glucose, Bld: 92 mg/dL (ref 70–99)
Potassium: 3.5 mmol/L (ref 3.5–5.1)
Sodium: 138 mmol/L (ref 135–145)

## 2021-05-25 LAB — LIPID PANEL
Cholesterol: 148 mg/dL (ref 0–200)
HDL: 43 mg/dL (ref >40)
LDL Cholesterol: 95 mg/dL (ref 0–99)
Total CHOL/HDL Ratio: 3.4 RATIO
Triglycerides: 52 mg/dL (ref <150)
VLDL: 10 mg/dL (ref 0–40)

## 2021-05-25 LAB — ECHOCARDIOGRAM COMPLETE
AR max vel: 4.58 cm2
AV Area VTI: 3.88 cm2
AV Area mean vel: 4.32 cm2
AV Mean grad: 3 mmHg
AV Peak grad: 5.8 mmHg
Ao pk vel: 1.2 m/s
Area-P 1/2: 1.67 cm2
Height: 70 in
MV VTI: 3.26 cm2
S' Lateral: 3.38 cm
Weight: 2264.57 oz

## 2021-05-25 LAB — HEMOGLOBIN A1C
Hgb A1c MFr Bld: 5.7 % — ABNORMAL HIGH (ref 4.8–5.6)
Mean Plasma Glucose: 117 mg/dL

## 2021-05-25 SURGERY — LEFT HEART CATH AND CORONARY ANGIOGRAPHY
Anesthesia: Moderate Sedation

## 2021-05-25 MED ORDER — SODIUM CHLORIDE 0.9 % IV SOLN: 250.0000 mL | INTRAVENOUS | Status: DC | PRN

## 2021-05-25 MED ORDER — VERAPAMIL HCL 2.5 MG/ML IV SOLN
INTRAVENOUS | Status: AC
  Filled 2021-05-25: qty 2

## 2021-05-25 MED ORDER — LABETALOL HCL 5 MG/ML IV SOLN: 10.0000 mg | INTRAVENOUS | Status: AC | PRN

## 2021-05-25 MED ORDER — HEPARIN (PORCINE) IN NACL 1000-0.9 UT/500ML-% IV SOLN
INTRAVENOUS | Status: AC
  Filled 2021-05-25: qty 1000

## 2021-05-25 MED ORDER — ENOXAPARIN SODIUM 40 MG/0.4ML IJ SOSY
40.0000 mg | PREFILLED_SYRINGE | INTRAMUSCULAR | Status: DC
  Administered 2021-05-25: 40 mg via SUBCUTANEOUS
  Filled 2021-05-25 (×2): qty 0.4

## 2021-05-25 MED ORDER — MIDAZOLAM HCL 2 MG/2ML IJ SOLN
INTRAMUSCULAR | Status: AC
  Filled 2021-05-25: qty 2

## 2021-05-25 MED ORDER — SODIUM CHLORIDE 0.9 % IV SOLN: INTRAVENOUS | Status: AC

## 2021-05-25 MED ORDER — VERAPAMIL HCL 2.5 MG/ML IV SOLN
INTRAVENOUS | Status: DC | PRN
Start: 2021-05-25 — End: 2021-05-25
  Administered 2021-05-25: 2.5 mg via INTRA_ARTERIAL

## 2021-05-25 MED ORDER — HEPARIN SODIUM (PORCINE) 1000 UNIT/ML IJ SOLN
INTRAMUSCULAR | Status: DC | PRN
  Administered 2021-05-25: 3500 [IU] via INTRAVENOUS

## 2021-05-25 MED ORDER — SODIUM CHLORIDE 0.9 % IV BOLUS
INTRAVENOUS | Status: DC | PRN
  Administered 2021-05-25: 250 mL via INTRAVENOUS

## 2021-05-25 MED ORDER — SODIUM CHLORIDE 0.9% FLUSH: 3.0000 mL | Freq: Two times a day (BID) | INTRAVENOUS | Status: DC

## 2021-05-25 MED ORDER — HEPARIN SODIUM (PORCINE) 1000 UNIT/ML IJ SOLN
INTRAMUSCULAR | Status: AC
  Filled 2021-05-25: qty 10

## 2021-05-25 MED ORDER — LIDOCAINE HCL (PF) 1 % IJ SOLN
INTRAMUSCULAR | Status: DC | PRN
  Administered 2021-05-25: 2 mL

## 2021-05-25 MED ORDER — OXYCODONE HCL 5 MG PO TABS
5.0000 mg | ORAL_TABLET | Freq: Four times a day (QID) | ORAL | Status: DC | PRN
  Administered 2021-05-25 – 2021-05-26 (×2): 5 mg via ORAL
  Filled 2021-05-25 (×2): qty 1

## 2021-05-25 MED ORDER — SODIUM CHLORIDE 0.9 % IV SOLN
250.0000 mL | INTRAVENOUS | Status: DC | PRN
Start: 2021-05-25 — End: 2021-05-25

## 2021-05-25 MED ORDER — SODIUM CHLORIDE 0.9% FLUSH: 3.0000 mL | INTRAVENOUS | Status: DC | PRN

## 2021-05-25 MED ORDER — SODIUM CHLORIDE 0.9% FLUSH
3.0000 mL | Freq: Two times a day (BID) | INTRAVENOUS | Status: DC
  Administered 2021-05-25 – 2021-05-26 (×2): 3 mL via INTRAVENOUS

## 2021-05-25 MED ORDER — MIDAZOLAM HCL 2 MG/2ML IJ SOLN
INTRAMUSCULAR | Status: DC | PRN
  Administered 2021-05-25: 2 mg via INTRAVENOUS

## 2021-05-25 MED ORDER — SODIUM CHLORIDE 0.9 % IV SOLN: INTRAVENOUS | Status: DC

## 2021-05-25 MED ORDER — ACETAMINOPHEN 325 MG PO TABS
ORAL_TABLET | ORAL | Status: AC
  Filled 2021-05-25: qty 2

## 2021-05-25 MED ORDER — IOHEXOL 300 MG/ML  SOLN
INTRAMUSCULAR | Status: DC | PRN
  Administered 2021-05-25: 23 mL

## 2021-05-25 MED ORDER — HYDRALAZINE HCL 20 MG/ML IJ SOLN: 10.0000 mg | INTRAMUSCULAR | Status: AC | PRN

## 2021-05-25 MED ORDER — FENTANYL CITRATE (PF) 100 MCG/2ML IJ SOLN
INTRAMUSCULAR | Status: DC | PRN
Start: 2021-05-25 — End: 2021-05-25
  Administered 2021-05-25: 25 ug via INTRAVENOUS

## 2021-05-25 MED ORDER — LIDOCAINE HCL 1 % IJ SOLN
INTRAMUSCULAR | Status: AC
  Filled 2021-05-25: qty 20

## 2021-05-25 MED ORDER — DICLOFENAC SODIUM 1 % EX GEL
2.0000 g | Freq: Four times a day (QID) | CUTANEOUS | Status: DC
  Administered 2021-05-25 – 2021-05-26 (×2): 2 g via TOPICAL
  Filled 2021-05-25: qty 100

## 2021-05-25 MED ORDER — FENTANYL CITRATE (PF) 100 MCG/2ML IJ SOLN
INTRAMUSCULAR | Status: AC
  Filled 2021-05-25: qty 2

## 2021-05-25 SURGICAL SUPPLY — 10 items
CATH 5FR JL3.5 JR4 ANG PIG MP (CATHETERS) ×1 IMPLANT
DEVICE RAD TR BAND REGULAR (VASCULAR PRODUCTS) ×1 IMPLANT
DRAPE BRACHIAL (DRAPES) ×1 IMPLANT
GLIDESHEATH SLEND SS 6F .021 (SHEATH) ×1 IMPLANT
GUIDEWIRE INQWIRE 1.5J.035X260 (WIRE) IMPLANT
INQWIRE 1.5J .035X260CM (WIRE) ×2
PACK CARDIAC CATH (CUSTOM PROCEDURE TRAY) ×2 IMPLANT
PROTECTION STATION PRESSURIZED (MISCELLANEOUS) ×2
SET ATX SIMPLICITY (MISCELLANEOUS) ×1 IMPLANT
STATION PROTECTION PRESSURIZED (MISCELLANEOUS) IMPLANT

## 2021-05-25 NOTE — Assessment & Plan Note (Addendum)
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  Aspirin and Lipitor prescribed.  Chest pain reproducible in nature we will give a dose of Solu-Medrol and prednisone taper upon going home.  Unable to do anti-inflammatories with chronic kidney disease. ?

## 2021-05-25 NOTE — Assessment & Plan Note (Signed)
Troponins only borderline 36, 35, 27 and 29. ?

## 2021-05-25 NOTE — Progress Notes (Signed)
?  Progress Note ? ? ?Patient: John Gregory [id number] DOB: 1965-11-21 DOA: 05/24/2021     0 ?DOS: the patient was seen and examined on 05/25/2021 ?  ?Assessment and Plan: ?* Chest pain- (present on admission) ?Cardiac cath today showed nonobstructive disease and medical management.  With his chronic kidney disease will need to check creatinine tomorrow before disposition. ? ?Elevated troponin- (present on admission) ?Troponins only borderline 36, 35, 27 and 29. ? ?Chronic kidney disease (CKD), stage IIIb- (present on admission) ?Creatinine 2.27 on presentation up to 2.51 today.  Need to check again tomorrow morning after IV fluid hydration and cardiac catheterization today. ? ?HLD (hyperlipidemia)- (present on admission) ?Continue atorvastatin.  LDL 95. ? ?HTN (hypertension)- (present on admission) ?Patient on Norvasc and Imdur. ? ? ? ? ? ? ? ?Subjective: Patient was having some chest pain this morning.  Occasional shortness of breath.  Occasional cough.  Patient seen before cardiac catheterization. ? ?Physical Exam: ?Vitals:  ? 05/25/21 1430 05/25/21 1445 05/25/21 1500 05/25/21 1548  ?BP: 121/79 119/78 113/76 119/73  ?Pulse: (!) 54 (!) 58 (!) 55 (!) 57  ?Resp: 13 15 14 15   ?Temp:    98.2 ?F (36.8 ?C)  ?TempSrc:    Oral  ?SpO2: 99% 100% 100% 99%  ?Weight:      ?Height:      ? ?Physical Exam ?HENT:  ?   Head: Normocephalic.  ?   Mouth/Throat:  ?   Pharynx: No oropharyngeal exudate.  ?Eyes:  ?   General: Lids are normal.  ?   Conjunctiva/sclera: Conjunctivae normal.  ?Cardiovascular:  ?   Rate and Rhythm: Normal rate and regular rhythm.  ?   Heart sounds: Normal heart sounds, S1 normal and S2 normal.  ?Pulmonary:  ?   Breath sounds: No decreased breath sounds, wheezing, rhonchi or rales.  ?Abdominal:  ?   Palpations: Abdomen is soft.  ?   Tenderness: There is no abdominal tenderness.  ?Musculoskeletal:  ?   Right lower leg: No swelling.  ?   Left lower leg: No swelling.  ?Skin: ?   General: Skin is warm.  ?    Findings: No rash.  ?Neurological:  ?   Mental Status: He is alert and oriented to person, place, and time.  ?  ? ?Data Reviewed: ?Creatinine 2.51 today, LDL 95 ? ?Family Communication: Unable to reach patient's wife on the phone ? ?Disposition: ?Status is: Observation ?I will need to check the patient's kidney function again tomorrow morning since the patient did receive dye and his creatinine is 2.51 today.  Disposition will depend on patient's creatinine. ? ?Planned Discharge Destination: Home. ? ? ?Author: ?Loletha Grayer, MD ?05/25/2021 4:36 PM ? ?For on call review [email or web address].  ? ?

## 2021-05-25 NOTE — Interval H&P Note (Signed)
History and Physical Interval Note: ? ?05/25/2021 ?12:34 PM ? ?John Gregory  has presented today for surgery, with the diagnosis of unstable angina.  The various methods of treatment have been discussed with the patient and family. After consideration of risks, benefits and other options for treatment, the patient has consented to  Procedure(s): ?LEFT HEART CATH AND CORONARY ANGIOGRAPHY (N/A) as a surgical intervention.  The patient's history has been reviewed, patient examined, no change in status, stable for surgery.  I have reviewed the patient's chart and labs.  Questions were answered to the patient's satisfaction.   ? ?Cath Lab Visit (complete for each Cath Lab visit) ? ?Clinical Evaluation Leading to the Procedure:  ? ?ACS: Yes.   ? ?Non-ACS:  N/A ? ?[name] ? ? ?

## 2021-05-25 NOTE — H&P (View-Only) (Signed)
? ? ?Progress Note ? ?Patient Name: John Gregory ?Date of Encounter: 05/25/2021 ? ?Primary Cardiologist: New to [facility] - consult by End ? ?Subjective  ? ?Had 7/10 chest pain last night, he is uncertain how long it lasted. He did not take anything or let staff know. No associated symptoms. Felt similar to what he experienced coming into the hospital. Currently, chest pain free. Renal function is overall stable. Receiving IV fluids.  ? ?Inpatient Medications  ?  ?Scheduled Meds: ? amLODipine  10 mg Oral Daily  ? aspirin EC  81 mg Oral Daily  ? atorvastatin  40 mg Oral Daily  ? enoxaparin (LOVENOX) injection  40 mg Subcutaneous Q24H  ? isosorbide mononitrate  30 mg Oral Daily  ? ?Continuous Infusions: ? sodium chloride 75 mL/hr at 05/24/21 2216  ? ?PRN Meds: ?acetaminophen **OR** acetaminophen, hydrALAZINE, morphine injection, nitroGLYCERIN, ondansetron (ZOFRAN) IV  ? ?Vital Signs  ?  ?Vitals:  ? 05/24/21 1939 05/25/21 0010 05/25/21 0411 05/25/21 [id number]  ?BP: 113/74 120/74 117/79   ?Pulse: (!) 52 61 (!) 48   ?Resp: 14 14 14    ?Temp: 98.2 ?F (36.8 ?C) 98.5 ?F (36.9 ?C) (!) 97.2 ?F (36.2 ?C)   ?TempSrc: Oral Oral    ?SpO2: 98% 100% 98%   ?Weight:    64.2 kg  ?Height:      ? ? ?Intake/Output Summary (Last 24 hours) at 05/25/2021 0742 ?Last data filed at 05/25/2021 0600 ?Gross per 24 hour  ?Intake 356.49 ml  ?Output --  ?Net 356.49 ml  ? ?Filed Weights  ? 05/24/21 0034 05/25/21 0412  ?Weight: 65.8 kg 64.2 kg  ? ? ?Telemetry  ?  ?Sinus rhythm with sinus tachycardia and bradycardia - Personally Reviewed ? ?ECG  ?  ?No new tracings - Personally Reviewed ? ?Physical Exam  ? ?GEN: No acute distress.   ?Neck: No JVD. ?Cardiac: RRR, no murmurs, rubs, or gallops.  ?Respiratory: Clear to auscultation bilaterally.  ?GI: Soft, nontender, non-distended.   ?MS: No edema; No deformity. ?Neuro:  Alert and oriented x 3; Nonfocal.  ?Psych: Normal affect. ? ?Labs  ?  ?Chemistry ?Recent Labs  ?Lab 05/24/21 ?0045 05/25/21 ?0505  ?NA 139 138  ?K  3.7 3.5  ?CL 107 107  ?CO2 26 25  ?GLUCOSE 90 92  ?BUN 17 21*  ?CREATININE 2.27* 2.51*  ?CALCIUM 8.9 8.2*  ?GFRNONAA 34* 30*  ?ANIONGAP 6 6  ?  ? ?Hematology ?Recent Labs  ?Lab 05/24/21 ?0045  ?WBC 6.1  ?RBC 4.17*  ?HGB 11.2*  ?HCT 35.4*  ?MCV 84.9  ?MCH 26.9  ?MCHC 31.6  ?RDW 12.8  ?PLT 223  ? ? ?Cardiac EnzymesNo results for input(s): TROPONINI in the last 168 hours. No results for input(s): TROPIPOC in the last 168 hours.  ? ?BNPNo results for input(s): BNP, PROBNP in the last 168 hours.  ? ?DDimer No results for input(s): DDIMER in the last 168 hours.  ? ?Radiology  ?  ?DG Chest 2 View ? ?Result Date: 05/24/2021 ?IMPRESSION: No active cardiopulmonary disease. Electronically Signed   By: John Gregory M.D.   On: 05/24/2021 01:07   ? ?Cardiac Studies  ? ?Lexiscan MPI 05/24/2021: ?  Low risk, probably normal pharmacologic myocardial perfusion stress test. ?  There is a small in size, mild in severity, reversible defect involving the mid inferior and inferolateral segments most consistent with artifact but cannot rule out subtle ischemia. ?  No prior infarct is seen. ?  Left ventricular systolic function is  normal by visual estimation and Siemens calculation (56%). ?  Dynamic T wave changes noted in the anterior leads following regadenason injection, which is nonspecific. ?  No significant coronary artery calcification is observed on the attenuation correction CT. ?  Innumberable hypodensities noted in the visualized liver and kidneys, compatible with the patient's history of PCKD. ? ?Patient Profile  ?   ?56 y.o. male with history of polycystic kidney disease with CKD stage IIIb, HTN, HLD, nephrolithiasis, and arthritis who is being seen today for the evaluation of chest pain and elevated troponin at the request of Dr. Blaine Gregory. ? ?Assessment & Plan  ?  ?1. Unstable angina: ?-Lexiscan MPI yesterday was low risk, and probably normal, though there was a small in size, mild in severity, reversible defect involving the mid  inferior and inferolateral segments most consistent with artifact, though cannot exclude ischemia ?-He had further 7/10 angina overnight ?-Currently chest pain free ?-Given ongoing angina, he would like to proceed with LHC today ?-NPO ?-If PCI is indicated, he may require a staged procedure in an effort to minimize contrast load ?-Continue newly started Imdur 30 mg ?-ASA 81 mg ?-LDL 95, await cath results for further recommendations  ?  ?2. Sinus bradycardia: ?-Has demonstrated appropriate chronotropic response ?-TSH normal ?-Avoid AV nodal blocking medications ?  ?3. Polycystic kidney disease with CKD stage IIIb: ?-Stable ?-Continue pre-cath IV fluids ?-Monitor ? ? ?Shared Decision Making/Informed Consent{ ? ?The risks [stroke (1 in 1000), death (1 in 44), kidney failure [usually temporary] (1 in 500), bleeding (1 in 200), allergic reaction [possibly serious] (1 in 200)], benefits (diagnostic support and management of coronary artery disease) and alternatives of a cardiac catheterization were discussed in detail with John Gregory and he is willing to proceed.  ? ?For questions or updates, please contact [place] ?Please consult [email or web address] for contact info under Cardiology/STEMI. ?  ? ?Signed, ?Christell Faith, PA-C ?[facility] ?Pager: [phone] ?05/25/2021, 7:42 AM ? ?

## 2021-05-25 NOTE — Progress Notes (Signed)
? ? ?Progress Note ? ?Patient Name: John Gregory ?Date of Encounter: 05/25/2021 ? ?Primary Cardiologist: New to [facility] - consult by End ? ?Subjective  ? ?Had 7/10 chest pain last night, he is uncertain how long it lasted. He did not take anything or let staff know. No associated symptoms. Felt similar to what he experienced coming into the hospital. Currently, chest pain free. Renal function is overall stable. Receiving IV fluids.  ? ?Inpatient Medications  ?  ?Scheduled Meds: ? amLODipine  10 mg Oral Daily  ? aspirin EC  81 mg Oral Daily  ? atorvastatin  40 mg Oral Daily  ? enoxaparin (LOVENOX) injection  40 mg Subcutaneous Q24H  ? isosorbide mononitrate  30 mg Oral Daily  ? ?Continuous Infusions: ? sodium chloride 75 mL/hr at 05/24/21 2216  ? ?PRN Meds: ?acetaminophen **OR** acetaminophen, hydrALAZINE, morphine injection, nitroGLYCERIN, ondansetron (ZOFRAN) IV  ? ?Vital Signs  ?  ?Vitals:  ? 05/24/21 1939 05/25/21 0010 05/25/21 0411 05/25/21 [id number]  ?BP: 113/74 120/74 117/79   ?Pulse: (!) 52 61 (!) 48   ?Resp: 14 14 14    ?Temp: 98.2 ?F (36.8 ?C) 98.5 ?F (36.9 ?C) (!) 97.2 ?F (36.2 ?C)   ?TempSrc: Oral Oral    ?SpO2: 98% 100% 98%   ?Weight:    64.2 kg  ?Height:      ? ? ?Intake/Output Summary (Last 24 hours) at 05/25/2021 0742 ?Last data filed at 05/25/2021 0600 ?Gross per 24 hour  ?Intake 356.49 ml  ?Output --  ?Net 356.49 ml  ? ?Filed Weights  ? 05/24/21 0034 05/25/21 0412  ?Weight: 65.8 kg 64.2 kg  ? ? ?Telemetry  ?  ?Sinus rhythm with sinus tachycardia and bradycardia - Personally Reviewed ? ?ECG  ?  ?No new tracings - Personally Reviewed ? ?Physical Exam  ? ?GEN: No acute distress.   ?Neck: No JVD. ?Cardiac: RRR, no murmurs, rubs, or gallops.  ?Respiratory: Clear to auscultation bilaterally.  ?GI: Soft, nontender, non-distended.   ?MS: No edema; No deformity. ?Neuro:  Alert and oriented x 3; Nonfocal.  ?Psych: Normal affect. ? ?Labs  ?  ?Chemistry ?Recent Labs  ?Lab 05/24/21 ?0045 05/25/21 ?0505  ?NA 139 138  ?K  3.7 3.5  ?CL 107 107  ?CO2 26 25  ?GLUCOSE 90 92  ?BUN 17 21*  ?CREATININE 2.27* 2.51*  ?CALCIUM 8.9 8.2*  ?GFRNONAA 34* 30*  ?ANIONGAP 6 6  ?  ? ?Hematology ?Recent Labs  ?Lab 05/24/21 ?0045  ?WBC 6.1  ?RBC 4.17*  ?HGB 11.2*  ?HCT 35.4*  ?MCV 84.9  ?MCH 26.9  ?MCHC 31.6  ?RDW 12.8  ?PLT 223  ? ? ?Cardiac EnzymesNo results for input(s): TROPONINI in the last 168 hours. No results for input(s): TROPIPOC in the last 168 hours.  ? ?BNPNo results for input(s): BNP, PROBNP in the last 168 hours.  ? ?DDimer No results for input(s): DDIMER in the last 168 hours.  ? ?Radiology  ?  ?DG Chest 2 View ? ?Result Date: 05/24/2021 ?IMPRESSION: No active cardiopulmonary disease. Electronically Signed   By: Anner Crete M.D.   On: 05/24/2021 01:07   ? ?Cardiac Studies  ? ?Lexiscan MPI 05/24/2021: ?  Low risk, probably normal pharmacologic myocardial perfusion stress test. ?  There is a small in size, mild in severity, reversible defect involving the mid inferior and inferolateral segments most consistent with artifact but cannot rule out subtle ischemia. ?  No prior infarct is seen. ?  Left ventricular systolic function is  normal by visual estimation and Siemens calculation (56%). ?  Dynamic T wave changes noted in the anterior leads following regadenason injection, which is nonspecific. ?  No significant coronary artery calcification is observed on the attenuation correction CT. ?  Innumberable hypodensities noted in the visualized liver and kidneys, compatible with the patient's history of PCKD. ? ?Patient Profile  ?   ?56 y.o. male with history of polycystic kidney disease with CKD stage IIIb, HTN, HLD, nephrolithiasis, and arthritis who is being seen today for the evaluation of chest pain and elevated troponin at the request of Dr. Blaine Hamper. ? ?Assessment & Plan  ?  ?1. Unstable angina: ?-Lexiscan MPI yesterday was low risk, and probably normal, though there was a small in size, mild in severity, reversible defect involving the mid  inferior and inferolateral segments most consistent with artifact, though cannot exclude ischemia ?-He had further 7/10 angina overnight ?-Currently chest pain free ?-Given ongoing angina, he would like to proceed with LHC today ?-NPO ?-If PCI is indicated, he may require a staged procedure in an effort to minimize contrast load ?-Continue newly started Imdur 30 mg ?-ASA 81 mg ?-LDL 95, await cath results for further recommendations  ?  ?2. Sinus bradycardia: ?-Has demonstrated appropriate chronotropic response ?-TSH normal ?-Avoid AV nodal blocking medications ?  ?3. Polycystic kidney disease with CKD stage IIIb: ?-Stable ?-Continue pre-cath IV fluids ?-Monitor ? ? ?Shared Decision Making/Informed Consent{ ? ?The risks [stroke (1 in 1000), death (1 in 30), kidney failure [usually temporary] (1 in 500), bleeding (1 in 200), allergic reaction [possibly serious] (1 in 200)], benefits (diagnostic support and management of coronary artery disease) and alternatives of a cardiac catheterization were discussed in detail with Mr. Loiacono and he is willing to proceed.  ? ?For questions or updates, please contact [place] ?Please consult [email or web address] for contact info under Cardiology/STEMI. ?  ? ?Signed, ?Christell Faith, PA-C ?[facility] ?Pager: [phone] ?05/25/2021, 7:42 AM ? ?

## 2021-05-25 NOTE — Assessment & Plan Note (Addendum)
Creatinine 2.27 on presentation up to 2.51 yesterday and down to 2.28 today.  Recommend follow-up as outpatient ?

## 2021-05-25 NOTE — Progress Notes (Signed)
Dr. Renae Gloss notified that telemetry orders will expire tonight. MD stated that at this time he will not be renewing orders. Will inform NOC RN. ?

## 2021-05-25 NOTE — Progress Notes (Signed)
Echo is being completed at bedside.

## 2021-05-25 NOTE — Assessment & Plan Note (Signed)
Continue atorvastatin.  LDL 95. ?

## 2021-05-25 NOTE — Progress Notes (Signed)
*  PRELIMINARY RESULTS* ?Echocardiogram ?2D Echocardiogram has been performed. ? ?John Gregory ?05/25/2021, 10:08 AM ?

## 2021-05-25 NOTE — Progress Notes (Signed)
Pt requested a modified bath. Pt was given wipes, spray-cleanser, deodorant, lotion and a hair comb. ?

## 2021-05-25 NOTE — Progress Notes (Signed)
Pt. Med. With 2 plain Tylenol 650 mg p.o. for c/o persistent HA, "7 or 8" on scale 1-10.  ?

## 2021-05-25 NOTE — Progress Notes (Signed)
Pt returned to Rm 256 post cardiac cath procedure. Vitals were obtained. Pt was given soda and ice cream per his request. Pt is laying in the position of comfort and stated that he had no concerns at this time.  ?

## 2021-05-25 NOTE — Progress Notes (Signed)
Patient c/o 7/10 stabbing chest pain. PRN morphine given. Vitals stable.  ?Dr. Hilton Sinclair paged and MD placed orders.  ?

## 2021-05-25 NOTE — Plan of Care (Signed)
  Problem: Health Behavior/Discharge Planning: Goal: Ability to manage health-related needs will improve Outcome: Progressing   Problem: Clinical Measurements: Goal: Respiratory complications will improve Outcome: Progressing   Problem: Clinical Measurements: Goal: Cardiovascular complication will be avoided Outcome: Progressing   Problem: Activity: Goal: Risk for activity intolerance will decrease Outcome: Progressing   Problem: Pain Managment: Goal: General experience of comfort will improve Outcome: Progressing   Problem: Safety: Goal: Ability to remain free from injury will improve Outcome: Progressing   

## 2021-05-25 NOTE — Assessment & Plan Note (Addendum)
Patient on Norvasc 

## 2021-05-26 ENCOUNTER — Encounter: Payer: Self-pay | Admitting: Internal Medicine

## 2021-05-26 DIAGNOSIS — E785 Hyperlipidemia, unspecified: Secondary | ICD-10-CM | POA: Diagnosis not present

## 2021-05-26 DIAGNOSIS — E782 Mixed hyperlipidemia: Secondary | ICD-10-CM

## 2021-05-26 DIAGNOSIS — I10 Essential (primary) hypertension: Secondary | ICD-10-CM | POA: Diagnosis not present

## 2021-05-26 DIAGNOSIS — R778 Other specified abnormalities of plasma proteins: Secondary | ICD-10-CM | POA: Diagnosis not present

## 2021-05-26 DIAGNOSIS — N1832 Chronic kidney disease, stage 3b: Secondary | ICD-10-CM | POA: Diagnosis not present

## 2021-05-26 DIAGNOSIS — R072 Precordial pain: Secondary | ICD-10-CM | POA: Diagnosis not present

## 2021-05-26 DIAGNOSIS — R079 Chest pain, unspecified: Secondary | ICD-10-CM | POA: Diagnosis not present

## 2021-05-26 LAB — HIV ANTIBODY (ROUTINE TESTING W REFLEX): HIV Screen 4th Generation wRfx: NONREACTIVE

## 2021-05-26 LAB — CBC
HCT: 31.5 % — ABNORMAL LOW (ref 39.0–52.0)
Hemoglobin: 10.3 g/dL — ABNORMAL LOW (ref 13.0–17.0)
MCH: 27.2 pg (ref 26.0–34.0)
MCHC: 32.7 g/dL (ref 30.0–36.0)
MCV: 83.3 fL (ref 80.0–100.0)
Platelets: 196 10*3/uL (ref 150–400)
RBC: 3.78 MIL/uL — ABNORMAL LOW (ref 4.22–5.81)
RDW: 13 % (ref 11.5–15.5)
WBC: 5.3 10*3/uL (ref 4.0–10.5)
nRBC: 0 % (ref 0.0–0.2)

## 2021-05-26 LAB — BASIC METABOLIC PANEL
Anion gap: 7 (ref 5–15)
BUN: 24 mg/dL — ABNORMAL HIGH (ref 6–20)
CO2: 24 mmol/L (ref 22–32)
Calcium: 8.3 mg/dL — ABNORMAL LOW (ref 8.9–10.3)
Chloride: 107 mmol/L (ref 98–111)
Creatinine, Ser: 2.28 mg/dL — ABNORMAL HIGH (ref 0.61–1.24)
GFR, Estimated: 33 mL/min — ABNORMAL LOW (ref >60)
Glucose, Bld: 118 mg/dL — ABNORMAL HIGH (ref 70–99)
Potassium: 3.4 mmol/L — ABNORMAL LOW (ref 3.5–5.1)
Sodium: 138 mmol/L (ref 135–145)

## 2021-05-26 MED ORDER — METHYLPREDNISOLONE SODIUM SUCC 40 MG IJ SOLR
40.0000 mg | INTRAMUSCULAR | Status: AC
  Administered 2021-05-26: 40 mg via INTRAVENOUS
  Filled 2021-05-26: qty 1

## 2021-05-26 MED ORDER — PREDNISONE 10 MG PO TABS: ORAL_TABLET | ORAL | 0 refills | Status: DC

## 2021-05-26 MED ORDER — OXYCODONE HCL 5 MG PO TABS: 5.0000 mg | ORAL_TABLET | Freq: Four times a day (QID) | ORAL | 0 refills | Status: DC | PRN

## 2021-05-26 MED ORDER — ATORVASTATIN CALCIUM 40 MG PO TABS
40.0000 mg | ORAL_TABLET | Freq: Every day | ORAL | 0 refills | Status: DC
Start: 2021-05-27 — End: 2022-05-09

## 2021-05-26 MED ORDER — ASPIRIN 81 MG PO TBEC: 81.0000 mg | DELAYED_RELEASE_TABLET | Freq: Every day | ORAL | 0 refills | Status: DC

## 2021-05-26 NOTE — Progress Notes (Signed)
Progress Note  Patient Name: John Gregory Date of Encounter: 05/26/2021  [facility] Cardiologist: End  Subjective   Reports continued left rib pain " Muscles around my heart" Was chopping wood when he appreciated left chest discomfort, was severe Worse on palpation of left chest, difficulty laying in bed, would hurt if he tried to move his upper body or breathes deep Works at [employer] Medications    Scheduled Meds:  amLODipine  10 mg Oral Daily   aspirin EC  81 mg Oral Daily   atorvastatin  40 mg Oral Daily   diclofenac Sodium  2 g Topical QID   enoxaparin (LOVENOX) injection  40 mg Subcutaneous Q24H   isosorbide mononitrate  30 mg Oral Daily   sodium chloride flush  3 mL Intravenous Q12H   Continuous Infusions:  sodium chloride Stopped (05/26/21 1021)   PRN Meds: sodium chloride, acetaminophen **OR** acetaminophen, morphine injection, nitroGLYCERIN, ondansetron (ZOFRAN) IV, oxyCODONE, sodium chloride flush   Vital Signs    Vitals:   05/25/21 2015 05/25/21 2333 05/26/21 0408 05/26/21 0755  BP: 126/79 122/78 128/87 119/70  Pulse: (!) 59 (!) 55 (!) 55 (!) 53  Resp: 18 18 18    Temp: 98.1 F (36.7 C) 97.9 F (36.6 C) 98.3 F (36.8 C) 98.1 F (36.7 C)  TempSrc:  Oral Oral Oral  SpO2: 99% 100% 100% 98%  Weight:   63.5 kg   Height:        Intake/Output Summary (Last 24 hours) at 05/26/2021 1409 Last data filed at 05/26/2021 0234 Gross per 24 hour  Intake 1710 ml  Output --  Net 1710 ml   Last 3 Weights 05/26/2021 05/25/2021 05/25/2021  Weight (lbs) 139 lb 14.4 oz 141 lb 8.6 oz 141 lb 8.6 oz  Weight (kg) 63.458 kg 64.2 kg 64.2 kg      Telemetry    Normal sinus rhythm- Personally Reviewed  ECG    - Personally Reviewed  Physical Exam   GEN: No acute distress.   Neck: No JVD Cardiac: RRR, no murmurs, rubs, or gallops.  Respiratory: Clear to auscultation bilaterally. GI: Soft, nontender, non-distended  MS: No edema; No deformity. Neuro:   Nonfocal  Psych: Normal affect   Labs    High Sensitivity Troponin:   Recent Labs  Lab 05/24/21 0045 05/24/21 0425 05/24/21 1906 05/24/21 2124  TROPONINIHS 36* 35* 27* 29*     Chemistry Recent Labs  Lab 05/24/21 0045 05/25/21 0505 05/26/21 0512  NA 139 138 138  K 3.7 3.5 3.4*  CL 107 107 107  CO2 26 25 24   GLUCOSE 90 92 118*  BUN 17 21* 24*  CREATININE 2.27* 2.51* 2.28*  CALCIUM 8.9 8.2* 8.3*  GFRNONAA 34* 30* 33*  ANIONGAP 6 6 7     Lipids  Recent Labs  Lab 05/25/21 0505  CHOL 148  TRIG 52  HDL 43  LDLCALC 95  CHOLHDL 3.4    Hematology Recent Labs  Lab 05/24/21 0045 05/26/21 0512  WBC 6.1 5.3  RBC 4.17* 3.78*  HGB 11.2* 10.3*  HCT 35.4* 31.5*  MCV 84.9 83.3  MCH 26.9 27.2  MCHC 31.6 32.7  RDW 12.8 13.0  PLT 223 196   Thyroid  Recent Labs  Lab 05/24/21 0045  TSH 1.517    BNPNo results for input(s): BNP, PROBNP in the last 168 hours.  DDimer No results for input(s): DDIMER in the last 168 hours.   Radiology    CARDIAC CATHETERIZATION  Result Date:  05/25/2021 Conclusions: Mild-moderate nonobstructive coronary disease with sequential 40% and 20% proximal/mid LAD stenoses and mild luminal irregularities involving dominant RCA. Low left ventricular filling pressure (LVEDP 5 mmHg). Recommendations: Medical therapy and risk factor modification to prevent progression of nonobstructive CAD. Post catheterization hydration to minimize risk for contrast-induced nephropathy. Consider evaluation for noncardiac causes of chest pain. Nelva Bush, MD [facility]  ECHOCARDIOGRAM COMPLETE  Result Date: 05/25/2021    ECHOCARDIOGRAM REPORT   Patient Name:   John Gregory Date of Exam: 05/25/2021 Medical Rec #:  [id number]          Height:       70.0 in Accession #:    [id number]         Weight:       141.5 lb Date of Birth:  1965/09/26         BSA:          1.802 m Patient Age:    56 years           BP:           118/90 mmHg Patient Gender: M                   HR:           54 bpm. Exam Location:  [facility] Procedure: 2D Echo, Cardiac Doppler and Color Doppler Indications:     R07.9 Chest pain  History:         Patient has no prior history of Echocardiogram examinations.                  CKD; Risk Factors:Hypertension and Dyslipidemia.  Sonographer:     Charmayne Sheer Referring Phys:  [id number] [place] Diagnosing Phys: Kate Sable MD IMPRESSIONS  1. Left ventricular ejection fraction, by estimation, is 50 to 55%. The left ventricle has low normal function. The left ventricle has no regional wall motion abnormalities. There is mild left ventricular hypertrophy. Left ventricular diastolic parameters were normal.  2. Right ventricular systolic function is normal. The right ventricular size is normal.  3. The mitral valve is normal in structure. Mild mitral valve regurgitation.  4. The aortic valve is tricuspid. Aortic valve regurgitation is trivial.  5. Aortic dilatation noted. There is borderline dilatation of the aortic root, measuring 38 mm.  6. The inferior vena cava is normal in size with greater than 50% respiratory variability, suggesting right atrial pressure of 3 mmHg. FINDINGS  Left Ventricle: Left ventricular ejection fraction, by estimation, is 50 to 55%. The left ventricle has low normal function. The left ventricle has no regional wall motion abnormalities. The left ventricular internal cavity size was normal in size. There is mild left ventricular hypertrophy. Left ventricular diastolic parameters were normal. Right Ventricle: The right ventricular size is normal. No increase in right ventricular wall thickness. Right ventricular systolic function is normal. Left Atrium: Left atrial size was normal in size. Right Atrium: Right atrial size was normal in size. Pericardium: There is no evidence of pericardial effusion. Mitral Valve: The mitral valve is normal in structure. Mild mitral valve regurgitation. MV peak gradient, 1.7 mmHg. The mean mitral valve gradient  is 1.0 mmHg. Tricuspid Valve: The tricuspid valve is normal in structure. Tricuspid valve regurgitation is trivial. Aortic Valve: The aortic valve is tricuspid. Aortic valve regurgitation is trivial. Aortic valve mean gradient measures 3.0 mmHg. Aortic valve peak gradient measures 5.8 mmHg. Aortic valve area, by VTI measures 3.88 cm. Pulmonic Valve: The  pulmonic valve was not well visualized. Pulmonic valve regurgitation is not visualized. Aorta: Aortic dilatation noted. There is borderline dilatation of the aortic root, measuring 38 mm. Venous: The inferior vena cava is normal in size with greater than 50% respiratory variability, suggesting right atrial pressure of 3 mmHg. IAS/Shunts: No atrial level shunt detected by color flow Doppler.  LEFT VENTRICLE PLAX 2D LVIDd:         5.11 cm   Diastology LVIDs:         3.38 cm   LV e' medial:    8.16 cm/s LV PW:         1.19 cm   LV E/e' medial:  7.8 LV IVS:        0.81 cm   LV e' lateral:   14.50 cm/s LVOT diam:     2.80 cm   LV E/e' lateral: 4.4 LV SV:         94 LV SV Index:   52 LVOT Area:     6.16 cm  RIGHT VENTRICLE RV Basal diam:  3.54 cm LEFT ATRIUM             Index        RIGHT ATRIUM           Index LA diam:        3.50 cm 1.94 cm/m   RA Area:     16.60 cm LA Vol (A2C):   34.9 ml 19.37 ml/m  RA Volume:   46.20 ml  25.64 ml/m LA Vol (A4C):   29.9 ml 16.59 ml/m LA Biplane Vol: 35.2 ml 19.53 ml/m  AORTIC VALVE                    PULMONIC VALVE AV Area (Vmax):    4.58 cm     PV Vmax:       0.71 m/s AV Area (Vmean):   4.32 cm     PV Vmean:      48.500 cm/s AV Area (VTI):     3.88 cm     PV VTI:        0.131 m AV Vmax:           120.00 cm/s  PV Peak grad:  2.0 mmHg AV Vmean:          86.000 cm/s  PV Mean grad:  1.0 mmHg AV VTI:            0.243 m AV Peak Grad:      5.8 mmHg AV Mean Grad:      3.0 mmHg LVOT Vmax:         89.20 cm/s LVOT Vmean:        60.300 cm/s LVOT VTI:          0.153 m LVOT/AV VTI ratio: 0.63  AORTA Ao Root diam: 3.80 cm MITRAL VALVE MV  Area (PHT): 1.67 cm    SHUNTS MV Area VTI:   3.26 cm    Systemic VTI:  0.15 m MV Peak grad:  1.7 mmHg    Systemic Diam: 2.80 cm MV Mean grad:  1.0 mmHg MV Vmax:       0.65 m/s MV Vmean:      41.1 cm/s MV Decel Time: 455 msec MV E velocity: 63.50 cm/s MV A velocity: 54.30 cm/s MV E/A ratio:  1.17 Kate Sable MD Electronically signed by Kate Sable MD Signature Date/Time: 05/25/2021/11:22:46 AM    Final     Cardiac Studies  Echocardiogram Left ventricular ejection fraction, by estimation, is 50 to 55%. The  left ventricle has low normal function. The left ventricle has no regional  wall motion abnormalities. There is mild left ventricular hypertrophy.  Left ventricular diastolic  parameters were normal.   2. Right ventricular systolic function is normal. The right ventricular  size is normal.   3. The mitral valve is normal in structure. Mild mitral valve  regurgitation.   4. The aortic valve is tricuspid. Aortic valve regurgitation is trivial.   5. Aortic dilatation noted. There is borderline dilatation of the aortic  root, measuring 38 mm.   6. The inferior vena cava is normal in size with greater than 50%  respiratory variability, suggesting right atrial pressure of 3 mmHg.    Cardiac catheterization performed yesterday Mild-moderate nonobstructive coronary disease with sequential 40% and 20% proximal/mid LAD stenoses and mild luminal irregularities involving dominant RCA. Low left ventricular filling pressure (LVEDP 5 mmHg).   Recommendations: Medical therapy and risk factor modification to prevent progression of nonobstructive CAD. Post catheterization hydration to minimize risk for contrast-induced nephropathy. Consider evaluation for noncardiac causes of chest pain.  Patient Profile     Mr. Alires is a 56 year old man with history of chronic kidney disease stage IIIb due to polycystic kidney disease, hypertension, hyperlipidemia, and nephrolithiasis, whom we have  been asked to see due to chest pain  Assessment & Plan    Atypical chest pain  developing after splitting wood Had stress test, low risk study on February 28 Echocardiogram with ejection fraction 50 to 55% per report Presumably was set up for cardiac catheterization given continued chest pain on March 1 yesterday which showed nonobstructive disease --On clinical exam, musculoskeletal etiology appreciated, reproducible Recommended ice pack,  resting Does not want to be written out of work for 2 weeks Long discussion with him, recommended light duty  polycystic kidney disease with CKD stage III Creatinine 2.28, down from 2.5 Stable following cardiac catheterization Avoid NSAIDs  Essential hypertension Continue amlodipine Not on beta-blocker secondary to bradycardia Can likely hold Imdur given noncardiac etiology of chest pain  Long discussion concerning causes of his left-sided chest pain, recent cardiac work-up Reassurance provided, recommended icing, resting  Total encounter time more than 50 minutes  Greater than 50% was spent in counseling and coordination of care with the patient  For questions or updates, please contact [place] Please consult [email or web address] for contact info under        Signed, Ida Rogue, MD  05/26/2021, 2:09 PM

## 2021-05-26 NOTE — Progress Notes (Signed)
?  Transition of Care (TOC) Screening Note ? ? ?Patient Details  ?Name: John Gregory ?Date of Birth: Feb 26, 1966 ? ? ?Transition of Care (TOC) CM/SW Contact:    ?Gildardo Griffes, LCSW ?Phone Number: ?05/26/2021, 8:57 AM ? ? ? ?Transition of Care Department [facility]) has reviewed patient and no TOC needs have been identified at this time. We will continue to monitor patient advancement through interdisciplinary progression rounds. If new patient transition needs arise, please place a TOC consult. ? Angeline Slim, [place] ?[phone] ? ?

## 2021-05-26 NOTE — Discharge Summary (Signed)
Physician Discharge Summary   Patient: John Gregory MRN: [id number] DOB: 29-Nov-1965  Admit date:     05/24/2021  Discharge date: 05/26/21  Discharge Physician: Loletha Grayer   PCP: Letta Median, MD   Recommendations at discharge:   Follow-up PCP 5 days  Discharge Diagnoses: Principal Problem:   Chest pain Active Problems:   HLD (hyperlipidemia)   HTN (hypertension)   Chronic kidney disease (CKD), stage IIIb   Elevated troponin   Hospital Course: The patient was admitted to the hospital with chest pain and borderline elevated troponins.  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  Medical management with aspirin and Lipitor recommended.  The patient still had pain afterwards.  I did give the patient Solu-Medrol and a prednisone taper upon going home because his pain was reproducible to palpation.  Assessment and Plan: * Chest pain Cardiac cath yesterday showed nonobstructive disease and medical management.  Aspirin and Lipitor prescribed.  Chest pain reproducible in nature we will give a dose of Solu-Medrol and prednisone taper upon going home.  Unable to do anti-inflammatories with chronic kidney disease.  Elevated troponin Troponins only borderline 36, 35, 27 and 29.  Chronic kidney disease (CKD), stage IIIb Creatinine 2.27 on presentation up to 2.51 yesterday and down to 2.28 today.  Recommend follow-up as outpatient.  Can consider restarting benazepril as outpatient once kidney function settles down.  Would hold off on hydrochlorothiazide.  HTN (hypertension) Patient on Norvasc.  HLD (hyperlipidemia) Continue atorvastatin.  LDL 95.           Consultants: Cardiology Procedures performed: Cardiac catheterization Disposition: Home Diet recommendation:   Cardiac diet  DISCHARGE MEDICATION: Allergies as of 05/26/2021       Reactions   Bactrim [sulfamethoxazole-trimethoprim] Itching   Calcium Other (See Comments)   Kidney stones   Penicillins Swelling   angioedema        Medication List     STOP taking these medications    benazepril-hydrochlorthiazide 20-12.5 MG tablet Commonly known as: LOTENSIN HCT       TAKE these medications    amLODipine 10 MG tablet Commonly known as: NORVASC Take 1 tablet (10 mg total) by mouth daily.   aspirin 81 MG EC tablet Take 1 tablet (81 mg total) by mouth daily. Swallow whole. Start taking on: May 27, 2021   atorvastatin 40 MG tablet Commonly known as: LIPITOR Take 1 tablet (40 mg total) by mouth daily. Start taking on: May 27, 2021   oxyCODONE 5 MG immediate release tablet Commonly known as: Oxy IR/ROXICODONE Take 1 tablet (5 mg total) by mouth every 6 (six) hours as needed for severe pain.   predniSONE 10 MG tablet Commonly known as: DELTASONE 4 tabs po day1; 3 tabs po day2; 2 tabs po day3; 1 tab po day4        Follow-up Information     Bender, Durene Cal, MD Follow up in 5 day(s).   Specialty: Family Medicine Why: Patient will need to make a follow up appointment. Contact information: [place] [address] [phone]                 Discharge Exam: Filed Weights   05/25/21 0412 05/25/21 1219 05/26/21 0408  Weight: 64.2 kg 64.2 kg 63.5 kg   Physical Exam HENT:  Head: Normocephalic.     Mouth/Throat:     Pharynx: No oropharyngeal exudate.  Eyes:     General: Lids are normal.     Conjunctiva/sclera: Conjunctivae normal.  Cardiovascular:     Rate and Rhythm: Normal rate and regular rhythm.     Heart sounds: Normal heart sounds, S1 normal and S2 normal.  Pulmonary:     Breath sounds: No decreased breath sounds, wheezing, rhonchi or rales.  Chest:     Comments: Pain to palpation over left side of the chest.  Pain  worse with moving his left arm. Abdominal:     Palpations: Abdomen is soft.     Tenderness: There is no abdominal tenderness.  Musculoskeletal:     Right lower leg: No swelling.     Left lower leg: No swelling.  Skin:    General: Skin is warm.     Findings: No rash.  Neurological:     Mental Status: He is alert and oriented to person, place, and time.     Condition at discharge: stable  The results of significant diagnostics from this hospitalization (including imaging, microbiology, ancillary and laboratory) are listed below for reference.   Imaging Studies: DG Chest 2 View  Result Date: 05/24/2021 CLINICAL DATA:  Chest pain. EXAM: CHEST - 2 VIEW COMPARISON:  Chest radiograph dated 09/05/2018. FINDINGS: No focal consolidation, pleural effusion, pneumothorax. The cardiac silhouette is within limits. Scoliosis. No acute osseous pathology. IMPRESSION: No active cardiopulmonary disease. Electronically Signed   By: Anner Crete M.D.   On: 05/24/2021 01:07   CARDIAC CATHETERIZATION  Result Date: 05/25/2021 Conclusions: Mild-moderate nonobstructive coronary disease with sequential 40% and 20% proximal/mid LAD stenoses and mild luminal irregularities involving dominant RCA. Low left ventricular filling pressure (LVEDP 5 mmHg). Recommendations: Medical therapy and risk factor modification to prevent progression of nonobstructive CAD. Post catheterization hydration to minimize risk for contrast-induced nephropathy. Consider evaluation for noncardiac causes of chest pain. Nelva Bush, MD [facility]  NM Myocar Multi W/Spect Tamela Oddi Motion / EF  Result Date: 05/24/2021   Low risk, probably normal pharmacologic myocardial perfusion stress test.   There is a small in size, mild in severity, reversible defect involving the mid inferior and inferolateral segments most consistent with artifact but cannot rule out subtle ischemia.   No prior infarct is seen.   Left ventricular systolic function  is normal by visual estimation and Siemens calculation (56%).   Dynamic T wave changes noted in the anterior leads following regadenason injection, which is nonspecific.   No significant coronary artery calcification is observed on the attenuation correction CT.   Innumberable hypodensities noted in the visualized liver and kidneys, compatible with the patient's history of PCKD.   ECHOCARDIOGRAM COMPLETE  Result Date: 05/25/2021    ECHOCARDIOGRAM REPORT   Patient Name:   John Gregory Date of Exam: 05/25/2021 Medical Rec #:  [id number]          Height:       70.0 in Accession #:    [id number]         Weight:       141.5 lb Date of Birth:  April 19, 1965         BSA:          1.802 m Patient Age:    56 years           BP:           118/90 mmHg Patient Gender: M  HR:           54 bpm. Exam Location:  [facility] Procedure: 2D Echo, Cardiac Doppler and Color Doppler Indications:     R07.9 Chest pain  History:         Patient has no prior history of Echocardiogram examinations.                  CKD; Risk Factors:Hypertension and Dyslipidemia.  Sonographer:     Charmayne Sheer Referring Phys:  [id number] [place] Diagnosing Phys: Kate Sable MD IMPRESSIONS  1. Left ventricular ejection fraction, by estimation, is 50 to 55%. The left ventricle has low normal function. The left ventricle has no regional wall motion abnormalities. There is mild left ventricular hypertrophy. Left ventricular diastolic parameters were normal.  2. Right ventricular systolic function is normal. The right ventricular size is normal.  3. The mitral valve is normal in structure. Mild mitral valve regurgitation.  4. The aortic valve is tricuspid. Aortic valve regurgitation is trivial.  5. Aortic dilatation noted. There is borderline dilatation of the aortic root, measuring 38 mm.  6. The inferior vena cava is normal in size with greater than 50% respiratory variability, suggesting right atrial pressure of 3 mmHg. FINDINGS  Left  Ventricle: Left ventricular ejection fraction, by estimation, is 50 to 55%. The left ventricle has low normal function. The left ventricle has no regional wall motion abnormalities. The left ventricular internal cavity size was normal in size. There is mild left ventricular hypertrophy. Left ventricular diastolic parameters were normal. Right Ventricle: The right ventricular size is normal. No increase in right ventricular wall thickness. Right ventricular systolic function is normal. Left Atrium: Left atrial size was normal in size. Right Atrium: Right atrial size was normal in size. Pericardium: There is no evidence of pericardial effusion. Mitral Valve: The mitral valve is normal in structure. Mild mitral valve regurgitation. MV peak gradient, 1.7 mmHg. The mean mitral valve gradient is 1.0 mmHg. Tricuspid Valve: The tricuspid valve is normal in structure. Tricuspid valve regurgitation is trivial. Aortic Valve: The aortic valve is tricuspid. Aortic valve regurgitation is trivial. Aortic valve mean gradient measures 3.0 mmHg. Aortic valve peak gradient measures 5.8 mmHg. Aortic valve area, by VTI measures 3.88 cm. Pulmonic Valve: The pulmonic valve was not well visualized. Pulmonic valve regurgitation is not visualized. Aorta: Aortic dilatation noted. There is borderline dilatation of the aortic root, measuring 38 mm. Venous: The inferior vena cava is normal in size with greater than 50% respiratory variability, suggesting right atrial pressure of 3 mmHg. IAS/Shunts: No atrial level shunt detected by color flow Doppler.  LEFT VENTRICLE PLAX 2D LVIDd:         5.11 cm   Diastology LVIDs:         3.38 cm   LV e' medial:    8.16 cm/s LV PW:         1.19 cm   LV E/e' medial:  7.8 LV IVS:        0.81 cm   LV e' lateral:   14.50 cm/s LVOT diam:     2.80 cm   LV E/e' lateral: 4.4 LV SV:         94 LV SV Index:   52 LVOT Area:     6.16 cm  RIGHT VENTRICLE RV Basal diam:  3.54 cm LEFT ATRIUM             Index        RIGHT  ATRIUM  Index LA diam:        3.50 cm 1.94 cm/m   RA Area:     16.60 cm LA Vol (A2C):   34.9 ml 19.37 ml/m  RA Volume:   46.20 ml  25.64 ml/m LA Vol (A4C):   29.9 ml 16.59 ml/m LA Biplane Vol: 35.2 ml 19.53 ml/m  AORTIC VALVE                    PULMONIC VALVE AV Area (Vmax):    4.58 cm     PV Vmax:       0.71 m/s AV Area (Vmean):   4.32 cm     PV Vmean:      48.500 cm/s AV Area (VTI):     3.88 cm     PV VTI:        0.131 m AV Vmax:           120.00 cm/s  PV Peak grad:  2.0 mmHg AV Vmean:          86.000 cm/s  PV Mean grad:  1.0 mmHg AV VTI:            0.243 m AV Peak Grad:      5.8 mmHg AV Mean Grad:      3.0 mmHg LVOT Vmax:         89.20 cm/s LVOT Vmean:        60.300 cm/s LVOT VTI:          0.153 m LVOT/AV VTI ratio: 0.63  AORTA Ao Root diam: 3.80 cm MITRAL VALVE MV Area (PHT): 1.67 cm    SHUNTS MV Area VTI:   3.26 cm    Systemic VTI:  0.15 m MV Peak grad:  1.7 mmHg    Systemic Diam: 2.80 cm MV Mean grad:  1.0 mmHg MV Vmax:       0.65 m/s MV Vmean:      41.1 cm/s MV Decel Time: 455 msec MV E velocity: 63.50 cm/s MV A velocity: 54.30 cm/s MV E/A ratio:  1.17 Kate Sable MD Electronically signed by Kate Sable MD Signature Date/Time: 05/25/2021/11:22:46 AM    Final     Microbiology: Results for orders placed or performed during the hospital encounter of 05/24/21  Resp Panel by RT-PCR (Flu A&B, Covid) Nasopharyngeal Swab     Status: None   Collection Time: 05/24/21  4:25 AM   Specimen: Nasopharyngeal Swab; Nasopharyngeal(NP) swabs in vial transport medium  Result Value Ref Range Status   SARS Coronavirus 2 by RT PCR NEGATIVE NEGATIVE Final    Comment: (NOTE) SARS-CoV-2 target nucleic acids are NOT DETECTED.  The SARS-CoV-2 RNA is generally detectable in upper respiratory specimens during the acute phase of infection. The lowest concentration of SARS-CoV-2 viral copies this assay can detect is 138 copies/mL. A negative result does not preclude SARS-Cov-2 infection and  should not be used as the sole basis for treatment or other patient management decisions. A negative result may occur with  improper specimen collection/handling, submission of specimen other than nasopharyngeal swab, presence of viral mutation(s) within the areas targeted by this assay, and inadequate number of viral copies(<138 copies/mL). A negative result must be combined with clinical observations, patient history, and epidemiological information. The expected result is Negative.  Fact Sheet for Patients:  EntrepreneurPulse.com.au  Fact Sheet for Healthcare Providers:  IncredibleEmployment.be  This test is no t yet approved or cleared by the [place] and  has been authorized for  detection and/or diagnosis of SARS-CoV-2 by FDA under an Emergency Use Authorization (EUA). This EUA will remain  in effect (meaning this test can be used) for the duration of the COVID-19 declaration under Section 564(b)(1) of the Act, 21 U.S.C.section 360bbb-3(b)(1), unless the authorization is terminated  or revoked sooner.       Influenza A by PCR NEGATIVE NEGATIVE Final   Influenza B by PCR NEGATIVE NEGATIVE Final    Comment: (NOTE) The Xpert Xpress SARS-CoV-2/FLU/RSV plus assay is intended as an aid in the diagnosis of influenza from Nasopharyngeal swab specimens and should not be used as a sole basis for treatment. Nasal washings and aspirates are unacceptable for Xpert Xpress SARS-CoV-2/FLU/RSV testing.  Fact Sheet for Patients: EntrepreneurPulse.com.au  Fact Sheet for Healthcare Providers: IncredibleEmployment.be  This test is not yet approved or cleared by the [place] FDA and has been authorized for detection and/or diagnosis of SARS-CoV-2 by FDA under an Emergency Use Authorization (EUA). This EUA will remain in effect (meaning this test can be used) for the duration of the COVID-19 declaration  under Section 564(b)(1) of the Act, 21 U.S.C. section 360bbb-3(b)(1), unless the authorization is terminated or revoked.  Performed at [facility] [address] [place], [place] [address]     Labs: CBC: Recent Labs  Lab 05/24/21 0045 05/26/21 0512  WBC 6.1 5.3  HGB 11.2* 10.3*  HCT 35.4* 31.5*  MCV 84.9 83.3  PLT 223 [id number]   Basic Metabolic Panel: Recent Labs  Lab 05/24/21 0045 05/25/21 0505 05/26/21 0512  NA 139 138 138  K 3.7 3.5 3.4*  CL 107 107 107  CO2 26 25 24   GLUCOSE 90 92 118*  BUN 17 21* 24*  CREATININE 2.27* 2.51* 2.28*  CALCIUM 8.9 8.2* 8.3*     Discharge time spent: greater than 30 minutes.  Signed: Loletha Grayer, MD [facility] 05/26/2021

## 2022-05-07 ENCOUNTER — Inpatient Hospital Stay
Admission: EM | Admit: 2022-05-07 | Discharge: 2022-05-09 | DRG: 872 | Disposition: A | Discharge status: Other Acute Inpatient Hospital | Payer: Commercial Managed Care - PPO | Attending: Family Medicine | Admitting: Family Medicine

## 2022-05-07 ENCOUNTER — Other Ambulatory Visit: Payer: Self-pay

## 2022-05-07 ENCOUNTER — Emergency Department: Payer: Commercial Managed Care - PPO

## 2022-05-07 DIAGNOSIS — L03119 Cellulitis of unspecified part of limb: Principal | ICD-10-CM

## 2022-05-07 DIAGNOSIS — L03011 Cellulitis of right finger: Secondary | ICD-10-CM | POA: Diagnosis present

## 2022-05-07 DIAGNOSIS — L03113 Cellulitis of right upper limb: Secondary | ICD-10-CM | POA: Diagnosis present

## 2022-05-07 DIAGNOSIS — A419 Sepsis, unspecified organism: Secondary | ICD-10-CM | POA: Diagnosis present

## 2022-05-07 DIAGNOSIS — N1832 Chronic kidney disease, stage 3b: Secondary | ICD-10-CM | POA: Diagnosis present

## 2022-05-07 DIAGNOSIS — L0390 Cellulitis, unspecified: Secondary | ICD-10-CM | POA: Diagnosis not present

## 2022-05-07 DIAGNOSIS — N189 Chronic kidney disease, unspecified: Secondary | ICD-10-CM

## 2022-05-07 DIAGNOSIS — E876 Hypokalemia: Secondary | ICD-10-CM | POA: Insufficient documentation

## 2022-05-07 DIAGNOSIS — I129 Hypertensive chronic kidney disease with stage 1 through stage 4 chronic kidney disease, or unspecified chronic kidney disease: Secondary | ICD-10-CM | POA: Diagnosis present

## 2022-05-07 DIAGNOSIS — Z79899 Other long term (current) drug therapy: Secondary | ICD-10-CM

## 2022-05-07 DIAGNOSIS — M1990 Unspecified osteoarthritis, unspecified site: Secondary | ICD-10-CM | POA: Diagnosis not present

## 2022-05-07 DIAGNOSIS — Z7982 Long term (current) use of aspirin: Secondary | ICD-10-CM | POA: Diagnosis not present

## 2022-05-07 DIAGNOSIS — Z888 Allergy status to other drugs, medicaments and biological substances status: Secondary | ICD-10-CM

## 2022-05-07 DIAGNOSIS — N179 Acute kidney failure, unspecified: Secondary | ICD-10-CM | POA: Diagnosis present

## 2022-05-07 DIAGNOSIS — G629 Polyneuropathy, unspecified: Secondary | ICD-10-CM | POA: Diagnosis present

## 2022-05-07 DIAGNOSIS — L02519 Cutaneous abscess of unspecified hand: Secondary | ICD-10-CM | POA: Diagnosis not present

## 2022-05-07 DIAGNOSIS — E785 Hyperlipidemia, unspecified: Secondary | ICD-10-CM | POA: Insufficient documentation

## 2022-05-07 DIAGNOSIS — Z880 Allergy status to penicillin: Secondary | ICD-10-CM

## 2022-05-07 DIAGNOSIS — I10 Essential (primary) hypertension: Secondary | ICD-10-CM | POA: Diagnosis not present

## 2022-05-07 DIAGNOSIS — Z8271 Family history of polycystic kidney: Secondary | ICD-10-CM | POA: Diagnosis not present

## 2022-05-07 DIAGNOSIS — E871 Hypo-osmolality and hyponatremia: Secondary | ICD-10-CM | POA: Diagnosis present

## 2022-05-07 DIAGNOSIS — L02511 Cutaneous abscess of right hand: Secondary | ICD-10-CM | POA: Diagnosis not present

## 2022-05-07 LAB — COMPREHENSIVE METABOLIC PANEL
ALT: 20 U/L (ref 0–44)
AST: 22 U/L (ref 15–41)
Albumin: 3.7 g/dL (ref 3.5–5.0)
Alkaline Phosphatase: 86 U/L (ref 38–126)
Anion gap: 12 (ref 5–15)
BUN: 31 mg/dL — ABNORMAL HIGH (ref 6–20)
CO2: 25 mmol/L (ref 22–32)
Calcium: 8.7 mg/dL — ABNORMAL LOW (ref 8.9–10.3)
Chloride: 97 mmol/L — ABNORMAL LOW (ref 98–111)
Creatinine, Ser: 3.31 mg/dL — ABNORMAL HIGH (ref 0.61–1.24)
GFR, Estimated: 21 mL/min — ABNORMAL LOW (ref >60)
Glucose, Bld: 101 mg/dL — ABNORMAL HIGH (ref 70–99)
Potassium: 3 mmol/L — ABNORMAL LOW (ref 3.5–5.1)
Sodium: 134 mmol/L — ABNORMAL LOW (ref 135–145)
Total Bilirubin: 1.6 mg/dL — ABNORMAL HIGH (ref 0.3–1.2)
Total Protein: 8.1 g/dL (ref 6.5–8.1)

## 2022-05-07 LAB — MAGNESIUM: Magnesium: 1.9 mg/dL (ref 1.7–2.4)

## 2022-05-07 LAB — APTT: aPTT: 33 seconds (ref 24–36)

## 2022-05-07 LAB — CBC WITH DIFFERENTIAL/PLATELET
Abs Immature Granulocytes: 0.12 10*3/uL — ABNORMAL HIGH (ref 0.00–0.07)
Basophils Absolute: 0 10*3/uL (ref 0.0–0.1)
Basophils Relative: 0 %
Eosinophils Absolute: 0.2 10*3/uL (ref 0.0–0.5)
Eosinophils Relative: 1 %
HCT: 40.1 % (ref 39.0–52.0)
Hemoglobin: 13.2 g/dL (ref 13.0–17.0)
Immature Granulocytes: 1 %
Lymphocytes Relative: 3 %
Lymphs Abs: 0.5 10*3/uL — ABNORMAL LOW (ref 0.7–4.0)
MCH: 27.4 pg (ref 26.0–34.0)
MCHC: 32.9 g/dL (ref 30.0–36.0)
MCV: 83.2 fL (ref 80.0–100.0)
Monocytes Absolute: 0.7 10*3/uL (ref 0.1–1.0)
Monocytes Relative: 5 %
Neutro Abs: 14.2 10*3/uL — ABNORMAL HIGH (ref 1.7–7.7)
Neutrophils Relative %: 90 %
Platelets: 165 10*3/uL (ref 150–400)
RBC: 4.82 MIL/uL (ref 4.22–5.81)
RDW: 12.8 % (ref 11.5–15.5)
WBC: 15.7 10*3/uL — ABNORMAL HIGH (ref 4.0–10.5)
nRBC: 0 % (ref 0.0–0.2)

## 2022-05-07 LAB — LACTIC ACID, PLASMA
Lactic Acid, Venous: 1.6 mmol/L (ref 0.5–1.9)
Lactic Acid, Venous: 1 mmol/L (ref 0.5–1.9)

## 2022-05-07 LAB — PROTIME-INR
INR: 1.2 (ref 0.8–1.2)
Prothrombin Time: 15.3 seconds — ABNORMAL HIGH (ref 11.4–15.2)

## 2022-05-07 LAB — URIC ACID: Uric Acid, Serum: 8.6 mg/dL (ref 3.7–8.6)

## 2022-05-07 MED ORDER — ACETAMINOPHEN 325 MG PO TABS
650.0000 mg | ORAL_TABLET | Freq: Four times a day (QID) | ORAL | Status: DC | PRN
  Administered 2022-05-08 – 2022-05-09 (×3): 650 mg via ORAL
  Filled 2022-05-07 (×3): qty 2

## 2022-05-07 MED ORDER — AMLODIPINE BESYLATE 5 MG PO TABS
10.0000 mg | ORAL_TABLET | Freq: Every day | ORAL | Status: DC
  Administered 2022-05-07: 10 mg via ORAL
  Filled 2022-05-07: qty 2

## 2022-05-07 MED ORDER — VANCOMYCIN VARIABLE DOSE PER UNSTABLE RENAL FUNCTION (PHARMACIST DOSING): Status: DC

## 2022-05-07 MED ORDER — SODIUM CHLORIDE 0.9 % IV BOLUS
1000.0000 mL | Freq: Once | INTRAVENOUS | Status: AC
  Administered 2022-05-07: 1000 mL via INTRAVENOUS

## 2022-05-07 MED ORDER — MORPHINE SULFATE (PF) 4 MG/ML IV SOLN
4.0000 mg | INTRAVENOUS | Status: DC | PRN
  Administered 2022-05-08: 4 mg via INTRAVENOUS
  Filled 2022-05-07: qty 1

## 2022-05-07 MED ORDER — POTASSIUM CHLORIDE IN NACL 20-0.9 MEQ/L-% IV SOLN
INTRAVENOUS | Status: DC
  Filled 2022-05-07 (×6): qty 1000

## 2022-05-07 MED ORDER — TRAZODONE HCL 50 MG PO TABS: 25.0000 mg | ORAL_TABLET | Freq: Every evening | ORAL | Status: DC | PRN

## 2022-05-07 MED ORDER — MORPHINE SULFATE (PF) 4 MG/ML IV SOLN
4.0000 mg | INTRAVENOUS | Status: DC | PRN
  Administered 2022-05-07: 4 mg via INTRAMUSCULAR

## 2022-05-07 MED ORDER — MORPHINE SULFATE (PF) 4 MG/ML IV SOLN
4.0000 mg | INTRAVENOUS | Status: DC | PRN
  Filled 2022-05-07: qty 1

## 2022-05-07 MED ORDER — MORPHINE SULFATE (PF) 2 MG/ML IV SOLN: 2.0000 mg | INTRAVENOUS | Status: DC | PRN

## 2022-05-07 MED ORDER — VANCOMYCIN HCL IN DEXTROSE 1-5 GM/200ML-% IV SOLN: 1000.0000 mg | Freq: Once | INTRAVENOUS | Status: DC

## 2022-05-07 MED ORDER — VANCOMYCIN HCL IN DEXTROSE 1-5 GM/200ML-% IV SOLN
1000.0000 mg | Freq: Once | INTRAVENOUS | Status: DC
  Administered 2022-05-07: 1000 mg via INTRAVENOUS
  Filled 2022-05-07: qty 200

## 2022-05-07 MED ORDER — CLINDAMYCIN PHOSPHATE 900 MG/50ML IV SOLN
900.0000 mg | Freq: Once | INTRAVENOUS | Status: AC
  Administered 2022-05-07: 900 mg via INTRAVENOUS
  Filled 2022-05-07: qty 50

## 2022-05-07 MED ORDER — ACETAMINOPHEN 325 MG PO TABS
650.0000 mg | ORAL_TABLET | Freq: Once | ORAL | Status: AC
  Administered 2022-05-07: 650 mg via ORAL
  Filled 2022-05-07: qty 2

## 2022-05-07 MED ORDER — LEVOFLOXACIN IN D5W 750 MG/150ML IV SOLN
750.0000 mg | Freq: Once | INTRAVENOUS | Status: AC
  Administered 2022-05-07: 750 mg via INTRAVENOUS
  Filled 2022-05-07: qty 150

## 2022-05-07 MED ORDER — VANCOMYCIN HCL 500 MG/100ML IV SOLN
500.0000 mg | Freq: Once | INTRAVENOUS | Status: AC
  Administered 2022-05-07: 500 mg via INTRAVENOUS
  Filled 2022-05-07: qty 100

## 2022-05-07 MED ORDER — POTASSIUM CHLORIDE 20 MEQ PO PACK
40.0000 meq | PACK | Freq: Once | ORAL | Status: AC
  Administered 2022-05-07: 40 meq via ORAL
  Filled 2022-05-07: qty 2

## 2022-05-07 MED ORDER — ENOXAPARIN SODIUM 40 MG/0.4ML IJ SOSY
40.0000 mg | PREFILLED_SYRINGE | INTRAMUSCULAR | Status: DC
  Administered 2022-05-07: 40 mg via SUBCUTANEOUS
  Filled 2022-05-07: qty 0.4

## 2022-05-07 MED ORDER — MORPHINE SULFATE (PF) 4 MG/ML IV SOLN
4.0000 mg | Freq: Once | INTRAVENOUS | Status: AC
  Administered 2022-05-07: 4 mg via INTRAVENOUS

## 2022-05-07 MED ORDER — ACETAMINOPHEN 650 MG RE SUPP: 650.0000 mg | Freq: Four times a day (QID) | RECTAL | Status: DC | PRN

## 2022-05-07 MED ORDER — SODIUM CHLORIDE 0.9 % IV SOLN
2.0000 g | Freq: Once | INTRAVENOUS | Status: AC
  Administered 2022-05-07: 2 g via INTRAVENOUS
  Filled 2022-05-07: qty 10

## 2022-05-07 MED ORDER — ONDANSETRON HCL 4 MG PO TABS: 4.0000 mg | ORAL_TABLET | Freq: Four times a day (QID) | ORAL | Status: DC | PRN

## 2022-05-07 MED ORDER — MAGNESIUM HYDROXIDE 400 MG/5ML PO SUSP: 30.0000 mL | Freq: Every day | ORAL | Status: DC | PRN

## 2022-05-07 MED ORDER — ASPIRIN 81 MG PO TBEC
81.0000 mg | DELAYED_RELEASE_TABLET | Freq: Every day | ORAL | Status: DC
  Administered 2022-05-07 – 2022-05-09 (×3): 81 mg via ORAL
  Filled 2022-05-07 (×3): qty 1

## 2022-05-07 MED ORDER — ONDANSETRON HCL 4 MG/2ML IJ SOLN: 4.0000 mg | Freq: Four times a day (QID) | INTRAMUSCULAR | Status: DC | PRN

## 2022-05-07 MED ORDER — LIDOCAINE HCL (PF) 1 % IJ SOLN
5.0000 mL | Freq: Once | INTRAMUSCULAR | Status: AC
  Administered 2022-05-07: 5 mL via INTRADERMAL
  Filled 2022-05-07: qty 5

## 2022-05-07 MED ORDER — VANCOMYCIN HCL 1250 MG/250ML IV SOLN
1250.0000 mg | Freq: Once | INTRAVENOUS | Status: DC
  Filled 2022-05-07: qty 250

## 2022-05-07 MED ORDER — OXYCODONE HCL 5 MG PO TABS
5.0000 mg | ORAL_TABLET | Freq: Four times a day (QID) | ORAL | Status: DC | PRN
  Administered 2022-05-08 – 2022-05-09 (×5): 5 mg via ORAL
  Filled 2022-05-07 (×5): qty 1

## 2022-05-07 MED ORDER — ONDANSETRON HCL 4 MG/2ML IJ SOLN
4.0000 mg | Freq: Once | INTRAMUSCULAR | Status: AC
  Administered 2022-05-07: 4 mg via INTRAVENOUS
  Filled 2022-05-07: qty 2

## 2022-05-07 MED ORDER — SODIUM CHLORIDE 0.9 % IV SOLN
1.0000 g | Freq: Three times a day (TID) | INTRAVENOUS | Status: DC
  Administered 2022-05-08 (×2): 1 g via INTRAVENOUS
  Filled 2022-05-07 (×3): qty 5
  Filled 2022-05-07: qty 1

## 2022-05-07 MED ORDER — ATORVASTATIN CALCIUM 20 MG PO TABS
40.0000 mg | ORAL_TABLET | Freq: Every day | ORAL | Status: DC
  Administered 2022-05-07: 40 mg via ORAL
  Filled 2022-05-07: qty 2

## 2022-05-07 MED ORDER — SIMETHICONE 80 MG PO CHEW
80.0000 mg | CHEWABLE_TABLET | Freq: Four times a day (QID) | ORAL | Status: DC | PRN
  Administered 2022-05-07: 80 mg via ORAL
  Filled 2022-05-07 (×2): qty 1

## 2022-05-07 NOTE — Consult Note (Signed)
Pharmacy Antibiotic Note  John Gregory is a 57 y.o. male admitted on 05/07/2022 with cellulitis.  Pharmacy has been consulted for aztreonam and vancomycin dosing.  Plan: Aztreonam 1gm IV every 8 hours Vancomycin 1259m IV x1. Followed by vancomycin variable dosing as patient is in acute renal failure.  Check random vancomycin and scr tomorrow morning with AM labs Continue to monitor and dose adjust antibiotics according to renal function and indication   Height: 5' 10"$  (177.8 cm) Weight: 65.8 kg (145 lb) IBW/kg (Calculated) : 73  Temp (24hrs), Avg:100.7 F (38.2 C), Min:99.5 F (37.5 C), Max:101.7 F (38.7 C)  Recent Labs  Lab 05/07/22 1823  WBC 15.7*  CREATININE 3.31*  LATICACIDVEN 1.6    Estimated Creatinine Clearance: 23.7 mL/min (A) (by C-G formula based on SCr of 3.31 mg/dL (H)).    Allergies  Allergen Reactions   Bactrim [Sulfamethoxazole-Trimethoprim] Itching   Calcium Other (See Comments)    Kidney stones   Penicillins Swelling    angioedema    Antimicrobials this admission: 2/11 vancomycin >>  2/11 aztreona >>    Microbiology results: 2/11 BCx: sent  Thank you for allowing pharmacy to be a part of this patient's care.  ADarrick Penna2/01/2023 8:31 PM

## 2022-05-07 NOTE — Progress Notes (Signed)
PHARMACY -  BRIEF ANTIBIOTIC NOTE   Pharmacy has received consult(s) for levofloxacin and vancomycin from an ED provider.  The patient's profile has been reviewed for ht/wt/allergies/indication/available labs.    One time order(s) placed for vancomycin 1,000 mg x 1 and levofloxacin 750 mg x 1  Further antibiotics/pharmacy consults should be ordered by admitting physician if indicated.                       Thank you, Glean Salvo, PharmD, BCPS Clinical Pharmacist  05/07/2022 6:44 PM

## 2022-05-07 NOTE — ED Notes (Signed)
Pt has swelling to R hand.  Rpts unsure of origin of cause

## 2022-05-07 NOTE — Assessment & Plan Note (Addendum)
-   We will continue his amlodipine but hold off his blood test and HCT given his AKI.

## 2022-05-07 NOTE — ED Triage Notes (Signed)
Pt to ED from work. He thinks he injured his hand at work on Friday and now it is swollen and pain radiating up his arm. Pt denies any trauma to it but is unsure how it happened. Pt hand it hot to the touch at this time. Pt is CAOx4 and ambulatory in triage.

## 2022-05-07 NOTE — ED Notes (Signed)
Morphine was given IM due to difficulty establishing IV access.

## 2022-05-07 NOTE — Assessment & Plan Note (Signed)
-   The patient will be placed on IV vancomycin and aztreonam as mentioned above given his penicillin allergy. - Warm compresses will be applied and pain management will be provided. - Orthopedic consult will be obtained. - I will call Dr. Harlow Mares about the patient.

## 2022-05-07 NOTE — ED Provider Notes (Signed)
[facility] Provider Note    Event Date/Time   First MD Initiated Contact with Patient 05/07/22 1714     (approximate)   History   Hand Injury (RIGHT)   HPI  John Gregory is a 57 y.o. male who presents to the ER for evaluation of right hand pain worsening over the past few days.  Works at [employer].  Denies any specific injury he thinks he likely got some dirt in the.  Denies any history of gout.  Has been unable to sleep due to the pain.  Noted over the past 24 hours the hand started swelling particular around the base of the right thumb.     Physical Exam   Triage Vital Signs: ED Triage Vitals  Enc Vitals Group     BP 05/07/22 1712 (!) 154/108     Pulse Rate 05/07/22 1712 (!) 121     Resp 05/07/22 1712 16     Temp 05/07/22 1712 99.5 F (37.5 C)     Temp Source 05/07/22 1712 Oral     SpO2 05/07/22 1712 98 %     Weight 05/07/22 1713 145 lb (65.8 kg)     Height 05/07/22 1713 5' 10"$  (1.778 m)     Head Circumference --      Peak Flow --      Pain Score 05/07/22 1713 10     Pain Loc --      Pain Edu? --      Excl. in [place]? --     Most recent vital signs: Vitals:   05/07/22 1843 05/07/22 1947  BP:  (!) 143/98  Pulse: 100 92  Resp: 16 16  Temp: (!) 101.7 F (38.7 C) (!) 101 F (38.3 C)  SpO2: 99% 92%     Constitutional: Alert  Eyes: Conjunctivae are normal.  Head: Atraumatic. Nose: No congestion/rhinnorhea. Mouth/Throat: Mucous membranes are moist.   Neck: Painless ROM.  Cardiovascular:   Good peripheral circulation. Respiratory: Normal respiratory effort.  No retractions.  Gastrointestinal: Soft and nontender.  Musculoskeletal: Extensive swelling and erythema of the right hand with some blistering over the dorsal aspect of the right thumb.  Does have some swelling erythema and cellulitic changes over the wrist and mid forearm.  Most the pain is located on the dorsal aspect of the thumb with an area of broken skin with serous  drainage.  Streaking cellulitis going up to the elbow with lymphadenopathy in the right axilla.  No crepitus.  No pain along the flexor tendon sheath of the fingers.  Does not have any pain with flexion or extension of the thumb or pain along the flexor tendon sheath. Neurologic:  MAE spontaneously. No gross focal neurologic deficits are appreciated.  Skin:  Skin is warm, dry and intact. No rash noted. Psychiatric: Mood and affect are normal. Speech and behavior are normal.    ED Results / Procedures / Treatments   Labs (all labs ordered are listed, but only abnormal results are displayed) Labs Reviewed  CBC WITH DIFFERENTIAL/PLATELET - Abnormal; Notable for the following components:      Result Value   WBC 15.7 (*)    Neutro Abs 14.2 (*)    Lymphs Abs 0.5 (*)    Abs Immature Granulocytes 0.12 (*)    All other components within normal limits  COMPREHENSIVE METABOLIC PANEL - Abnormal; Notable for the following components:   Sodium 134 (*)    Potassium 3.0 (*)    Chloride 97 (*)  Glucose, Bld 101 (*)    BUN 31 (*)    Creatinine, Ser 3.31 (*)    Calcium 8.7 (*)    Total Bilirubin 1.6 (*)    GFR, Estimated 21 (*)    All other components within normal limits  PROTIME-INR - Abnormal; Notable for the following components:   Prothrombin Time 15.3 (*)    All other components within normal limits  CULTURE, BLOOD (ROUTINE X 2)  CULTURE, BLOOD (ROUTINE X 2)  URIC ACID  LACTIC ACID, PLASMA  APTT  LACTIC ACID, PLASMA     EKG     RADIOLOGY Please see ED Course for my review and interpretation.  I personally reviewed all radiographic images ordered to evaluate for the above acute complaints and reviewed radiology reports and findings.  These findings were personally discussed with the patient.  Please see medical record for radiology report.    PROCEDURES:  Critical Care performed: No  Procedures   MEDICATIONS ORDERED IN ED: Medications  vancomycin (VANCOCIN) IVPB  1000 mg/200 mL premix (has no administration in time range)  levofloxacin (LEVAQUIN) IVPB 750 mg (has no administration in time range)  morphine (PF) 4 MG/ML injection 4 mg (has no administration in time range)  morphine (PF) 4 MG/ML injection 4 mg (has no administration in time range)  ondansetron (ZOFRAN) injection 4 mg (4 mg Intravenous Given 05/07/22 1846)  lidocaine (PF) (XYLOCAINE) 1 % injection 5 mL (5 mLs Intradermal Given 05/07/22 1822)  clindamycin (CLEOCIN) IVPB 900 mg (0 mg Intravenous Stopped 05/07/22 1926)  sodium chloride 0.9 % bolus 1,000 mL (1,000 mLs Intravenous New Bag/Given 05/07/22 1844)  acetaminophen (TYLENOL) tablet 650 mg (650 mg Oral Given 05/07/22 1854)  morphine (PF) 4 MG/ML injection 4 mg (4 mg Intravenous Given 05/07/22 1851)     IMPRESSION / MDM / ASSESSMENT AND PLAN / ED COURSE  I reviewed the triage vital signs and the nursing notes.                              Differential diagnosis includes, but is not limited to, cellulitis, abscess, flexor tenosynovitis, gout, septic arthritis, lymphangitis, osteo-, foreign body, nec-fasc  Patient presenting to the ER for evaluation of symptoms as described above.  Based on symptoms, risk factors and considered above differential, this presenting complaint could reflect a potentially life-threatening illness therefore the patient will be placed on continuous pulse oximetry and telemetry for monitoring.  Laboratory evaluation will be sent to evaluate for the above complaints.  [place] cellulitis patient does appear to be septic with fever and tachycardia will order septic workup.  X-ray ordered for by differential.    Clinical Course as of 05/07/22 1957  Sun May 07, 2022  1826 Bedside ultrasound I do not appreciate any identifiable foreign body.  No abscess appears more alert diffuse cellulitis.  CT imaging ordered due to concern for deep space infection or abscess.  Have ordered broad-spectrum antibiotics.  Has angioedema  allergic reaction to penicillins and no documented tolerance of Rocephin or cephalosporins.  Concern for sepsis secondary to cellulitis. [PR]  1829 Due to difficulty with obtaining IV access, a 20G peripheral IV catheter was inserted using US guidance into the LUE.  The site was prepped with chlorhexidine and allowed to dry.  The patient tolerated the procedure without any complications.  [PR]  [id number] With acute on chronic kidney disease.  Baseline.  Leukocytosis.  Lactate is normal uric acid normal [PR]  1941 CT imaging on my review and interpretation without discernible abscess.  I do not see any gas to suggest necrotizing infection. [PR]  1952 CT imaging per radiology report report with diffuse edema and cellulitic changes but no abscess no foreign body no sign of septic arthritis 3.  His lactate is normal.  Heart rate downtrending.  Patient receiving broad-spectrum antibiotics.  Does appear stable and appropriate for admission for sepsis secondary to cellulitis of the hand.  Consult hospitalist. [PR]    Clinical Course User Index [PR] Merlyn Lot, MD    FINAL CLINICAL IMPRESSION(S) / ED DIAGNOSES   Final diagnoses:  Cellulitis of hand     Rx / DC Orders   ED Discharge Orders     None        Note:  This document was prepared using Dragon voice recognition software and may include unintentional dictation errors.    Merlyn Lot, MD 05/07/22 [phone]

## 2022-05-07 NOTE — H&P (Signed)
[place]   PATIENT NAME: John Gregory    MR#:  [id number]  DATE OF BIRTH:  1965-11-22  DATE OF ADMISSION:  05/07/2022  PRIMARY CARE PHYSICIAN: Letta Median, MD   Patient is coming from: Home  REQUESTING/REFERRING PHYSICIAN: Merlyn Lot, MD  CHIEF COMPLAINT:   Chief Complaint  Patient presents with   Hand Injury    RIGHT    HISTORY OF PRESENT ILLNESS:  Lukes Gregory is a 57 y.o. male with medical history significant for hypertension, stage IIIb CKD, osteoarthritis, dyslipidemia, urolithiasis and polycystic kidney disease, who presented to the ER with acute onset of right hand pain for 3 days with associated swelling and erythema, tenderness, warmth and induration with pain.  The patient denies any injuries.  He may have gotten dirt on his hand and stated that he may have poked it.  He has not been able to sleep from pain.  His swelling got worse over the last 24 hours specially around the base of his right thumb.  No fever or chills here but he admitted to tactile fever at home.  No nausea or vomiting or abdominal pain.  No chest pain or dyspnea or cough.  No dysuria, oliguria or hematuria or flank pain.  No bleeding diathesis.  ED Course: When he came to the ER,BP was temperature 99 then 101.7 154/108 with heart rate of 121 and otherwise normal vital signs.  Labs revealed hyponatremia and hypochloremia, hypokalemia of 3, BUN of 31 and creatinine 3.31 compared to 24/2.28 and calcium 8.7.  CBC showed leukocytosis of 15.7 with neutrophilia and lactic acid was 1.6.  PT was 15.3 with INR 1.2 blood cultures were drawn.  Imaging: 2 view hand x-ray showed 2 mm superficial faint radiodensity within the soft tissue overlying the first MCP with significant surrounding edema concerning for a foreign body.  CT of the right hand and forearm without contrast revealed the following: 1. Diffuse forearm to wrist edema and/or cellulitis, but no evidence of an abscess. No  soft tissue air to suggest fasciitis. 2. No fracture or bone lesion. No evidence of osteomyelitis. No joint effusion to suggest septic arthropathy.   The patient was given following of IV morphine sulfate and following of IV Zofran, 1 L bolus of IV normal saline, IV vancomycin and clindamycin as well as 650 mg of p.o. Tylenol.  He will be admitted to medical telemetry bed for further evaluation and management.  PAST MEDICAL HISTORY:   Past Medical History:  Diagnosis Date   Arthritis    hands, shoulders   Chronic kidney disease (CKD), stage IIIb    HLD (hyperlipidemia)    Hypertension    Kidney stone    Kidney stone    Kidney stones    Polycystic kidney disease    Renal disorder    Wears contact lenses    Wears dentures    partial upper and lower    PAST SURGICAL HISTORY:   Past Surgical History:  Procedure Laterality Date   ANKLE SURGERY Left    EXCISION MASS NECK Left 10/31/2017   Procedure: EXCISION MASS NECK;  Surgeon: Carloyn Manner, MD;  Location: [facility];  Service: ENT;  Laterality: Left;   INGUINAL HERNIA REPAIR Bilateral 07/25/2019   Procedure: HERNIA REPAIR INGUINAL ADULT;  Surgeon: Fredirick Maudlin, MD;  Location: [facility];  Service: General;  Laterality: Bilateral;   KNEE SURGERY Bilateral    LEFT HEART CATH AND CORONARY ANGIOGRAPHY N/A 05/25/2021  Procedure: LEFT HEART CATH AND CORONARY ANGIOGRAPHY;  Surgeon: Nelva Bush, MD;  Location: [facility];  Service: Cardiovascular;  Laterality: N/A;   SHOULDER SURGERY Left    WRIST SURGERY Left     SOCIAL HISTORY:   Social History   Tobacco Use   Smoking status: Never   Smokeless tobacco: Current    Types: Snuff  Substance Use Topics   Alcohol use: No    FAMILY HISTORY:   Family History  Problem Relation Age of Onset   Polycystic kidney disease Mother    Polycystic kidney disease Sister     DRUG ALLERGIES:   Allergies  Allergen Reactions   Bactrim  [Sulfamethoxazole-Trimethoprim] Itching   Calcium Other (See Comments)    Kidney stones   Penicillins Swelling    angioedema    REVIEW OF SYSTEMS:   ROS As per history of present illness. All pertinent systems were reviewed above. Constitutional, HEENT, cardiovascular, respiratory, GI, GU, musculoskeletal, neuro, psychiatric, endocrine, integumentary and hematologic systems were reviewed and are otherwise negative/unremarkable except for positive findings mentioned above in the HPI.   MEDICATIONS AT HOME:   Prior to Admission medications   Medication Sig Start Date End Date Taking? Authorizing Provider  amLODipine (NORVASC) 10 MG tablet Take 1 tablet (10 mg total) by mouth daily. 02/11/20   Lavonia Drafts, MD  aspirin EC 81 MG EC tablet Take 1 tablet (81 mg total) by mouth daily. Swallow whole. 05/27/21   Loletha Grayer, MD  atorvastatin (LIPITOR) 40 MG tablet Take 1 tablet (40 mg total) by mouth daily. 05/27/21   Loletha Grayer, MD  oxyCODONE (OXY IR/ROXICODONE) 5 MG immediate release tablet Take 1 tablet (5 mg total) by mouth every 6 (six) hours as needed for severe pain. 05/26/21   Loletha Grayer, MD  predniSONE (DELTASONE) 10 MG tablet 4 tabs po day1; 3 tabs po day2; 2 tabs po day3; 1 tab po day4 05/26/21   Loletha Grayer, MD      VITAL SIGNS:  Blood pressure 115/74, pulse 75, temperature 99.1 F (37.3 C), temperature source Oral, resp. rate 17, height 5' 10"$  (1.778 m), weight 65.8 kg, SpO2 96 %.  PHYSICAL EXAMINATION:  Physical Exam  GENERAL:  57 y.o.-year-old African-American male patient lying in the bed with no acute distress.  EYES: Pupils equal, round, reactive to light and accommodation. No scleral icterus. Extraocular muscles intact.  HEENT: Head atraumatic, normocephalic. Oropharynx and nasopharynx clear.  NECK:  Supple, no jugular venous distention. No thyroid enlargement, no tenderness.  LUNGS: Normal breath sounds bilaterally, no wheezing, rales,rhonchi or  crepitation. No use of accessory muscles of respiration.  CARDIOVASCULAR: Regular rate and rhythm, S1, S2 normal. No murmurs, rubs, or gallops.  ABDOMEN: Soft, nondistended, nontender. Bowel sounds present. No organomegaly or mass.  EXTREMITIES: No pedal edema, cyanosis, or clubbing.  NEUROLOGIC: Cranial nerves II through XII are intact. Muscle strength 5/5 in all extremities. Sensation intact. Gait not checked.  PSYCHIATRIC: The patient is alert and oriented x 3.  Normal affect and good eye contact. SKIN: Swollen erythematous thenar eminence and thumb with associated warmth, no erythema, tenderness and extension to the forearm with mild warmth and tenderness and streaks of lymphangitis reaching to his axilla.  There was no fluctuation.        LABORATORY PANEL:   CBC Recent Labs  Lab 05/07/22 1823  WBC 15.7*  HGB 13.2  HCT 40.1  PLT 165   ------------------------------------------------------------------------------------------------------------------  Chemistries  Recent Labs  Lab 05/07/22 1823 05/07/22  1832  NA 134*  --   K 3.0*  --   CL 97*  --   CO2 25  --   GLUCOSE 101*  --   BUN 31*  --   CREATININE 3.31*  --   CALCIUM 8.7*  --   MG  --  1.9  AST 22  --   ALT 20  --   ALKPHOS 86  --   BILITOT 1.6*  --    ------------------------------------------------------------------------------------------------------------------  Cardiac Enzymes No results for input(s): "TROPONINI" in the last 168 hours. ------------------------------------------------------------------------------------------------------------------  RADIOLOGY:  CT HAND RIGHT WO CONTRAST  Result Date: 05/07/2022 CLINICAL DATA:  Pain and swelling. Concern for abscess. Possible injury at work 2 days ago. EXAM: CT OF THE RIGHT HAND WITHOUT CONTRAST CT OF THE RIGHT FOREARM WITHOUT CONTRAST TECHNIQUE: Multidetector CT imaging of the right hand was performed according to the standard protocol. Multiplanar  CT image reconstructions were also generated. RADIATION DOSE REDUCTION: This exam was performed according to the departmental dose-optimization program which includes automated exposure control, adjustment of the mA and/or kV according to patient size and/or use of iterative reconstruction technique. COMPARISON:  None Available. FINDINGS: Bones/Joint/Cartilage No fracture. No bone lesion. No bone resorption to suggest osteomyelitis. Elbow and wrist joints are normally spaced and aligned. No joint effusion. Ligaments Suboptimally assessed by CT. Muscles and Tendons Muscles unremarkable.  Tendons intact. Soft tissues Diffuse increased attenuation in the subcutaneous and deeper fat planes consistent with edema, possible cellulitis. No fluid collection to suggest an abscess. No soft tissue air. No radiopaque foreign body. IMPRESSION: 1. Diffuse forearm to wrist edema and/or cellulitis, but no evidence of an abscess. No soft tissue air to suggest fasciitis. 2. No fracture or bone lesion. No evidence of osteomyelitis. No joint effusion to suggest septic arthropathy. Electronically Signed   By: Lajean Manes M.D.   On: 05/07/2022 19:48   CT FOREARM RIGHT WO CONTRAST  Result Date: 05/07/2022 CLINICAL DATA:  Pain and swelling. Concern for abscess. Possible injury at work 2 days ago. EXAM: CT OF THE RIGHT HAND WITHOUT CONTRAST CT OF THE RIGHT FOREARM WITHOUT CONTRAST TECHNIQUE: Multidetector CT imaging of the right hand was performed according to the standard protocol. Multiplanar CT image reconstructions were also generated. RADIATION DOSE REDUCTION: This exam was performed according to the departmental dose-optimization program which includes automated exposure control, adjustment of the mA and/or kV according to patient size and/or use of iterative reconstruction technique. COMPARISON:  None Available. FINDINGS: Bones/Joint/Cartilage No fracture. No bone lesion. No bone resorption to suggest osteomyelitis. Elbow and  wrist joints are normally spaced and aligned. No joint effusion. Ligaments Suboptimally assessed by CT. Muscles and Tendons Muscles unremarkable.  Tendons intact. Soft tissues Diffuse increased attenuation in the subcutaneous and deeper fat planes consistent with edema, possible cellulitis. No fluid collection to suggest an abscess. No soft tissue air. No radiopaque foreign body. IMPRESSION: 1. Diffuse forearm to wrist edema and/or cellulitis, but no evidence of an abscess. No soft tissue air to suggest fasciitis. 2. No fracture or bone lesion. No evidence of osteomyelitis. No joint effusion to suggest septic arthropathy. Electronically Signed   By: Lajean Manes M.D.   On: 05/07/2022 19:48   DG Hand 2 View Right  Result Date: 05/07/2022 CLINICAL DATA:  Concern for foreign body at first MCP joint. EXAM: RIGHT HAND - 2 VIEW COMPARISON:  Right hand radiograph Aug 19 2017 FINDINGS: There is no evidence of fracture or dislocation. There is no evidence of arthropathy  or other focal bone abnormality. There is a 2 mm superficial faint radiodensity within the soft tissues overlying the first MCP joint. There is also significant surrounding soft tissue edema. IMPRESSION: 1. No acute fracture or dislocation. 2. There is a 2 mm superficial faint radiodensity within the soft tissues overlying the first MCP joint, with significant surrounding edema, concerning for a foreign body. Electronically Signed   By: Beryle Flock M.D.   On: 05/07/2022 17:44      IMPRESSION AND PLAN:  Assessment and Plan: * Sepsis due to cellulitis [facility]) - The patient will be admitted to a medical bed. - We will continue antibiotic therapy with IV vancomycin and aztreonam. - Pain management will be provided. - Warm compresses will be utilized. -She will be hydrated with IV normal saline.  Cellulitis of multiple sites of right hand and fingers - The patient will be placed on IV vancomycin and aztreonam as mentioned above given his  penicillin allergy. - Warm compresses will be applied and pain management will be provided. - Orthopedic consult will be obtained. - I will call Dr. Harlow Mares about the patient.  Acute kidney injury superimposed on chronic kidney disease ([place]) - We will hold off his Lotensin HCT and continue hydration with IV normal saline. - This is likely prerenal AKI. - We will follow BMPs.  Hypokalemia - Potassium will be replaced and magnesium level will be checked.  Dyslipidemia - We will continue statin therapy.  Essential hypertension - We will continue his amlodipine but hold off his blood test and HCT given his AKI.   DVT prophylaxis: Lovenox.  Advanced Care Planning:  Code Status: full code.  Family Communication:  The plan of care was discussed in details with the patient (and family). I answered all questions. The patient agreed to proceed with the above mentioned plan. Further management will depend upon hospital course. Disposition Plan: Back to previous home environment Consults called: Orthopedic consult. All the records are reviewed and case discussed with ED provider.  Status is: Inpatient  At the time of the admission, it appears that the appropriate admission status for this patient is inpatient.  This is judged to be reasonable and necessary in order to provide the required intensity of service to ensure the patient's safety given the presenting symptoms, physical exam findings and initial radiographic and laboratory data in the context of comorbid conditions.  The patient requires inpatient status due to high intensity of service, high risk of further deterioration and high frequency of surveillance required.  I certify that at the time of admission, it is my clinical judgment that the patient will require inpatient hospital care extending more than 2 midnights.                            Dispo: The patient is from: Home              Anticipated d/c is to: Home               Patient currently is not medically stable to d/c.              Difficult to place patient: No  Christel Mormon M.D on 05/07/2022 at 10:52 PM  [facility]   From 7 PM-7 AM, contact night-coverage [email or web address]  CC: Primary care physician; Letta Median, MD

## 2022-05-07 NOTE — Assessment & Plan Note (Signed)
-   We will continue statin therapy. 

## 2022-05-07 NOTE — Assessment & Plan Note (Signed)
-   We will hold off his Lotensin HCT and continue hydration with IV normal saline. - This is likely prerenal AKI. - We will follow BMPs.

## 2022-05-07 NOTE — Assessment & Plan Note (Addendum)
-   The patient will be admitted to a medical bed. - We will continue antibiotic therapy with IV vancomycin and aztreonam. - Pain management will be provided. - Warm compresses will be utilized. -She will be hydrated with IV normal saline.

## 2022-05-07 NOTE — ED Notes (Signed)
Report given to [facility] RN

## 2022-05-07 NOTE — Assessment & Plan Note (Signed)
-   Potassium will be replaced and magnesium level will be checked. ?

## 2022-05-08 ENCOUNTER — Inpatient Hospital Stay: Payer: Commercial Managed Care - PPO

## 2022-05-08 DIAGNOSIS — L03011 Cellulitis of right finger: Secondary | ICD-10-CM | POA: Diagnosis not present

## 2022-05-08 DIAGNOSIS — L03113 Cellulitis of right upper limb: Secondary | ICD-10-CM | POA: Diagnosis not present

## 2022-05-08 DIAGNOSIS — A419 Sepsis, unspecified organism: Secondary | ICD-10-CM | POA: Diagnosis not present

## 2022-05-08 DIAGNOSIS — N179 Acute kidney failure, unspecified: Secondary | ICD-10-CM | POA: Diagnosis not present

## 2022-05-08 DIAGNOSIS — L0390 Cellulitis, unspecified: Secondary | ICD-10-CM | POA: Diagnosis not present

## 2022-05-08 DIAGNOSIS — I129 Hypertensive chronic kidney disease with stage 1 through stage 4 chronic kidney disease, or unspecified chronic kidney disease: Secondary | ICD-10-CM

## 2022-05-08 LAB — PROTIME-INR
INR: 1.3 — ABNORMAL HIGH (ref 0.8–1.2)
Prothrombin Time: 16 seconds — ABNORMAL HIGH (ref 11.4–15.2)

## 2022-05-08 LAB — CBC
HCT: 35.3 % — ABNORMAL LOW (ref 39.0–52.0)
Hemoglobin: 11.3 g/dL — ABNORMAL LOW (ref 13.0–17.0)
MCH: 27.6 pg (ref 26.0–34.0)
MCHC: 32 g/dL (ref 30.0–36.0)
MCV: 86.3 fL (ref 80.0–100.0)
Platelets: 144 10*3/uL — ABNORMAL LOW (ref 150–400)
RBC: 4.09 MIL/uL — ABNORMAL LOW (ref 4.22–5.81)
RDW: 12.9 % (ref 11.5–15.5)
WBC: 15.3 10*3/uL — ABNORMAL HIGH (ref 4.0–10.5)
nRBC: 0 % (ref 0.0–0.2)

## 2022-05-08 LAB — BASIC METABOLIC PANEL
Anion gap: 8 (ref 5–15)
BUN: 27 mg/dL — ABNORMAL HIGH (ref 6–20)
CO2: 23 mmol/L (ref 22–32)
Calcium: 7.9 mg/dL — ABNORMAL LOW (ref 8.9–10.3)
Chloride: 103 mmol/L (ref 98–111)
Creatinine, Ser: 2.77 mg/dL — ABNORMAL HIGH (ref 0.61–1.24)
GFR, Estimated: 26 mL/min — ABNORMAL LOW (ref >60)
Glucose, Bld: 105 mg/dL — ABNORMAL HIGH (ref 70–99)
Potassium: 3.8 mmol/L (ref 3.5–5.1)
Sodium: 134 mmol/L — ABNORMAL LOW (ref 135–145)

## 2022-05-08 LAB — PROCALCITONIN: Procalcitonin: 3.39 ng/mL

## 2022-05-08 LAB — CORTISOL-AM, BLOOD: Cortisol - AM: 13 ug/dL (ref 6.7–22.6)

## 2022-05-08 LAB — VANCOMYCIN, RANDOM: Vancomycin Rm: 19 ug/mL

## 2022-05-08 MED ORDER — SODIUM CHLORIDE 0.9 % IV SOLN
2.0000 g | INTRAVENOUS | Status: DC
  Administered 2022-05-08: 2 g via INTRAVENOUS
  Filled 2022-05-08: qty 12.5
  Filled 2022-05-08: qty 2

## 2022-05-08 MED ORDER — SENNA 8.6 MG PO TABS
1.0000 | ORAL_TABLET | Freq: Two times a day (BID) | ORAL | Status: DC
  Administered 2022-05-08 – 2022-05-09 (×2): 8.6 mg via ORAL
  Filled 2022-05-08 (×2): qty 1

## 2022-05-08 MED ORDER — AMLODIPINE BESYLATE 10 MG PO TABS
10.0000 mg | ORAL_TABLET | Freq: Every day | ORAL | Status: DC
  Administered 2022-05-08 – 2022-05-09 (×2): 10 mg via ORAL
  Filled 2022-05-08 (×2): qty 1

## 2022-05-08 MED ORDER — VANCOMYCIN HCL IN DEXTROSE 1-5 GM/200ML-% IV SOLN
1000.0000 mg | Freq: Once | INTRAVENOUS | Status: AC
  Administered 2022-05-08: 1000 mg via INTRAVENOUS
  Filled 2022-05-08: qty 200

## 2022-05-08 MED ORDER — LORATADINE 10 MG PO TABS: 10.0000 mg | ORAL_TABLET | Freq: Every day | ORAL | Status: DC

## 2022-05-08 MED ORDER — DOCUSATE SODIUM 100 MG PO CAPS
100.0000 mg | ORAL_CAPSULE | Freq: Two times a day (BID) | ORAL | Status: DC
  Administered 2022-05-08 – 2022-05-09 (×2): 100 mg via ORAL
  Filled 2022-05-08 (×2): qty 1

## 2022-05-08 MED ORDER — LORATADINE 10 MG PO TABS
10.0000 mg | ORAL_TABLET | Freq: Every day | ORAL | Status: DC
  Administered 2022-05-08 – 2022-05-09 (×2): 10 mg via ORAL
  Filled 2022-05-08 (×3): qty 1

## 2022-05-08 MED ORDER — ENOXAPARIN SODIUM 30 MG/0.3ML IJ SOSY
30.0000 mg | PREFILLED_SYRINGE | INTRAMUSCULAR | Status: DC
  Administered 2022-05-08: 30 mg via SUBCUTANEOUS
  Filled 2022-05-08: qty 0.3

## 2022-05-08 MED ORDER — LINEZOLID 600 MG/300ML IV SOLN
600.0000 mg | Freq: Two times a day (BID) | INTRAVENOUS | Status: DC
  Administered 2022-05-09: 600 mg via INTRAVENOUS
  Filled 2022-05-08: qty 300

## 2022-05-08 MED ORDER — GADOBUTROL 1 MMOL/ML IV SOLN
6.0000 mL | Freq: Once | INTRAVENOUS | Status: AC | PRN
  Administered 2022-05-08: 6 mL via INTRAVENOUS

## 2022-05-08 MED ORDER — ATORVASTATIN CALCIUM 20 MG PO TABS
40.0000 mg | ORAL_TABLET | Freq: Every day | ORAL | Status: DC
  Administered 2022-05-08: 40 mg via ORAL
  Filled 2022-05-08: qty 2

## 2022-05-08 MED ORDER — METRONIDAZOLE 500 MG/100ML IV SOLN
500.0000 mg | Freq: Two times a day (BID) | INTRAVENOUS | Status: DC
  Administered 2022-05-08 – 2022-05-09 (×2): 500 mg via INTRAVENOUS
  Filled 2022-05-08 (×4): qty 100

## 2022-05-08 NOTE — Consult Note (Signed)
ORTHOPAEDIC CONSULTATION  REQUESTING PHYSICIAN: Shawna Clamp, MD  Chief Complaint: right hand pain  HPI: John Gregory is a 57 y.o. male who complains of right thumb and hand pain for the past 3 days. He has a medical history significant for hypertension, stage IIIb CKD, osteoarthritis, dyslipidemia, urolithiasis and polycystic kidney disease . The pain is sharp in character. The pain is severe and 5/10. The pain is worse with movement and better with rest. Denies any numbness, tingling or constitutional symptoms.  Past Medical History:  Diagnosis Date   Arthritis    hands, shoulders   Chronic kidney disease (CKD), stage IIIb    HLD (hyperlipidemia)    Hypertension    Kidney stone    Kidney stone    Kidney stones    Polycystic kidney disease    Renal disorder    Wears contact lenses    Wears dentures    partial upper and lower   Past Surgical History:  Procedure Laterality Date   ANKLE SURGERY Left    EXCISION MASS NECK Left 10/31/2017   Procedure: EXCISION MASS NECK;  Surgeon: Carloyn Manner, MD;  Location: [facility];  Service: ENT;  Laterality: Left;   INGUINAL HERNIA REPAIR Bilateral 07/25/2019   Procedure: HERNIA REPAIR INGUINAL ADULT;  Surgeon: Fredirick Maudlin, MD;  Location: [facility];  Service: General;  Laterality: Bilateral;   KNEE SURGERY Bilateral    LEFT HEART CATH AND CORONARY ANGIOGRAPHY N/A 05/25/2021   Procedure: LEFT HEART CATH AND CORONARY ANGIOGRAPHY;  Surgeon: Nelva Bush, MD;  Location: [facility];  Service: Cardiovascular;  Laterality: N/A;   SHOULDER SURGERY Left    WRIST SURGERY Left    Social History   Socioeconomic History   Marital status: Married    Spouse name: Santiago Glad   Number of children: 4   Years of education: Not on file   Highest education level: Not on file  Occupational History   Not on file  Tobacco Use   Smoking status: Never   Smokeless tobacco: Current    Types: Snuff  Vaping Use   Vaping Use:  Never used  Substance and Sexual Activity   Alcohol use: No   Drug use: No   Sexual activity: Not on file  Other Topics Concern   Not on file  Social History Narrative   Lives at home with wife and son/son's wife and their child.    Social Determinants of Health   Financial Resource Strain: Not on file  Food Insecurity: Not on file  Transportation Needs: Not on file  Physical Activity: Not on file  Stress: Not on file  Social Connections: Not on file   Family History  Problem Relation Age of Onset   Polycystic kidney disease Mother    Polycystic kidney disease Sister    Allergies  Allergen Reactions   Bactrim [Sulfamethoxazole-Trimethoprim] Itching   Calcium Other (See Comments)    Kidney stones   Penicillins Swelling    angioedema   Prior to Admission medications   Medication Sig Start Date End Date Taking? Authorizing Provider  amLODipine (NORVASC) 10 MG tablet Take 1 tablet (10 mg total) by mouth daily. 02/11/20  Yes Lavonia Drafts, MD  benazepril-hydrochlorthiazide (LOTENSIN HCT) 20-12.5 MG tablet Take 1 tablet by mouth daily. 05/04/22  Yes [provider]  aspirin EC 81 MG EC tablet Take 1 tablet (81 mg total) by mouth daily. Swallow whole. Patient not taking: Reported on 05/07/2022 05/27/21   Loletha Grayer, MD  atorvastatin (  LIPITOR) 40 MG tablet Take 1 tablet (40 mg total) by mouth daily. Patient not taking: Reported on 05/07/2022 05/27/21   Loletha Grayer, MD  gabapentin (NEURONTIN) 800 MG tablet Take 800 mg by mouth 3 (three) times daily. Patient not taking: Reported on 05/07/2022 05/07/22   [provider]  oxyCODONE (OXY IR/ROXICODONE) 5 MG immediate release tablet Take 1 tablet (5 mg total) by mouth every 6 (six) hours as needed for severe pain. Patient not taking: Reported on 05/07/2022 05/26/21   Loletha Grayer, MD  predniSONE (DELTASONE) 10 MG tablet 4 tabs po day1; 3 tabs po day2; 2 tabs po day3; 1 tab po day4 Patient not taking: Reported on  05/07/2022 05/26/21   Loletha Grayer, MD   CT HAND RIGHT WO CONTRAST  Result Date: 05/07/2022 CLINICAL DATA:  Pain and swelling. Concern for abscess. Possible injury at work 2 days ago. EXAM: CT OF THE RIGHT HAND WITHOUT CONTRAST CT OF THE RIGHT FOREARM WITHOUT CONTRAST TECHNIQUE: Multidetector CT imaging of the right hand was performed according to the standard protocol. Multiplanar CT image reconstructions were also generated. RADIATION DOSE REDUCTION: This exam was performed according to the departmental dose-optimization program which includes automated exposure control, adjustment of the mA and/or kV according to patient size and/or use of iterative reconstruction technique. COMPARISON:  None Available. FINDINGS: Bones/Joint/Cartilage No fracture. No bone lesion. No bone resorption to suggest osteomyelitis. Elbow and wrist joints are normally spaced and aligned. No joint effusion. Ligaments Suboptimally assessed by CT. Muscles and Tendons Muscles unremarkable.  Tendons intact. Soft tissues Diffuse increased attenuation in the subcutaneous and deeper fat planes consistent with edema, possible cellulitis. No fluid collection to suggest an abscess. No soft tissue air. No radiopaque foreign body. IMPRESSION: 1. Diffuse forearm to wrist edema and/or cellulitis, but no evidence of an abscess. No soft tissue air to suggest fasciitis. 2. No fracture or bone lesion. No evidence of osteomyelitis. No joint effusion to suggest septic arthropathy. Electronically Signed   By: Lajean Manes M.D.   On: 05/07/2022 19:48   CT FOREARM RIGHT WO CONTRAST  Result Date: 05/07/2022 CLINICAL DATA:  Pain and swelling. Concern for abscess. Possible injury at work 2 days ago. EXAM: CT OF THE RIGHT HAND WITHOUT CONTRAST CT OF THE RIGHT FOREARM WITHOUT CONTRAST TECHNIQUE: Multidetector CT imaging of the right hand was performed according to the standard protocol. Multiplanar CT image reconstructions were also generated. RADIATION  DOSE REDUCTION: This exam was performed according to the departmental dose-optimization program which includes automated exposure control, adjustment of the mA and/or kV according to patient size and/or use of iterative reconstruction technique. COMPARISON:  None Available. FINDINGS: Bones/Joint/Cartilage No fracture. No bone lesion. No bone resorption to suggest osteomyelitis. Elbow and wrist joints are normally spaced and aligned. No joint effusion. Ligaments Suboptimally assessed by CT. Muscles and Tendons Muscles unremarkable.  Tendons intact. Soft tissues Diffuse increased attenuation in the subcutaneous and deeper fat planes consistent with edema, possible cellulitis. No fluid collection to suggest an abscess. No soft tissue air. No radiopaque foreign body. IMPRESSION: 1. Diffuse forearm to wrist edema and/or cellulitis, but no evidence of an abscess. No soft tissue air to suggest fasciitis. 2. No fracture or bone lesion. No evidence of osteomyelitis. No joint effusion to suggest septic arthropathy. Electronically Signed   By: Lajean Manes M.D.   On: 05/07/2022 19:48   DG Hand 2 View Right  Result Date: 05/07/2022 CLINICAL DATA:  Concern for foreign body at first MCP joint. EXAM: RIGHT  HAND - 2 VIEW COMPARISON:  Right hand radiograph Aug 19 2017 FINDINGS: There is no evidence of fracture or dislocation. There is no evidence of arthropathy or other focal bone abnormality. There is a 2 mm superficial faint radiodensity within the soft tissues overlying the first MCP joint. There is also significant surrounding soft tissue edema. IMPRESSION: 1. No acute fracture or dislocation. 2. There is a 2 mm superficial faint radiodensity within the soft tissues overlying the first MCP joint, with significant surrounding edema, concerning for a foreign body. Electronically Signed   By: Beryle Flock M.D.   On: 05/07/2022 17:44    Positive ROS: All other systems have been reviewed and were otherwise negative with  the exception of those mentioned in the HPI and as above.  Physical Exam: General: Alert, no acute distress Cardiovascular: No pedal edema Respiratory: No cyanosis, no use of accessory musculature GI: No organomegaly, abdomen is soft and non-tender Skin: No lesions in the area of chief complaint Neurologic: Sensation intact distally Psychiatric: Patient is competent for consent with normal mood and affect Lymphatic: No axillary or cervical lymphadenopathy  MUSCULOSKELETAL: right thumb swollen, mild erythema, pain with ROM. Compartments soft. Good cap refill. Motor and sensory intact distally.  Assessment: Right thumb cellulitis  Plan: The labs and imaging are reviewed. There is no evidence of fluid collection or fasciitis. If condition worsens would consider an MRI scan. The PCN allergy is not a contraindication to cephalosporin use if indicated. Recommend ID consult. Please re-consult orthopedics if fluid collection develops.    Lovell Sheehan, MD    05/08/2022 12:11 PM

## 2022-05-08 NOTE — Progress Notes (Signed)
PROGRESS NOTE    John Gregory  [id number] DOB: [birth year] DOA: 05/07/2022 PCP: Letta Median, MD   Brief Narrative:  This 57 years old male with PMH significant for hypertension, CKD stage IIIb, osteoarthritis, dyslipidemia, urolithiasis, polycystic kidney disease presented in the ED with acute onset of right hand pain for 3 days associated with swelling, erythema tenderness warmth and induration with the pain.  Patient denies any injury to the hand.  He may have gotten dirt on his hand and stated if he may have poked it.  He has not been able to sleep from the pain.  Pain has gotten worse in the last 24 hours around the base of his right thumb.  Significant labs include creatinine 3.31 up from 2.28, WBC 15.7.  Right hand x-ray shows 2 mm superficial faint radiodensity within the soft tissue overlying the first MCP with significant surrounding edema concerning for a foreign body. No fracture or bone lesion, no evidence of osteomyelitis.  Patient is admitted for further evaluation.  Patient started on empiric antibiotics.  Orthopedics is consulted.  Assessment & Plan:   Principal Problem:   Sepsis due to cellulitis [facility]) Active Problems:   Cellulitis of multiple sites of right hand and fingers   Acute kidney injury superimposed on chronic kidney disease ([place])   Essential hypertension   Dyslipidemia   Peripheral neuropathy   Hypokalemia  Sepsis due to Hand cellulitis: Patient presented with worsening pain, erythema, redness, tenderness. Initiated on empiric antibiotic vancomycin and aztreonam. Continue adequate pain control with pain medications Warm compresses will be utilized. Continue IV fluid resuscitation.   Cellulitis of multiple sites in the right hand: Continue IV vancomycin and aztreonam. Continue warm compresses, adequate pain control Orthopedics consulted.  There is no evidence of fluid collection or fasciitis. If condition worsens consider an MRI.  ID  consulted.   AKI on CKD stage IIIa: Will hold Lotensin HCT and continue hydration with IV normal saline. Likely prerenal.  Continue to monitor serum creatinine.  Hypokalemia: Replaced and resolved.  Dyslipidemia: Continue Lipitor 40 mg daily   Essential hypertension Continue amlodipine 10 mg daily.  DVT prophylaxis: Lovenox Code Status: Full code Family Communication: No family at bed side. Disposition Plan:   Status is: Inpatient Remains inpatient appropriate because: Admitted with right hand cellulitis.  Orthopedics is consulted,  infectious diseases consulted.  Consultants:  Orthopedic Infectious diseases  Procedures:   None Antimicrobials:  Anti-infectives (From admission, onward)    Start     Dose/Rate Route Frequency Ordered Stop   05/08/22 1000  vancomycin (VANCOCIN) IVPB 1000 mg/200 mL premix        1,000 mg 200 mL/hr over 60 Minutes Intravenous  Once 05/08/22 0910 05/08/22 1027   05/08/22 0600  aztreonam (AZACTAM) 1 g in sodium chloride 0.9 % 100 mL IVPB        1 g 200 mL/hr over 30 Minutes Intravenous Every 8 hours 05/07/22 2042     05/07/22 2115  vancomycin (VANCOREADY) IVPB 500 mg/100 mL        500 mg 100 mL/hr over 60 Minutes Intravenous  Once 05/07/22 2113 05/08/22 0012   05/07/22 2045  vancomycin (VANCOREADY) IVPB 1250 mg/250 mL  Status:  Discontinued        1,250 mg 166.7 mL/hr over 90 Minutes Intravenous  Once 05/07/22 2042 05/07/22 2113   05/07/22 2043  vancomycin variable dose per unstable renal function (pharmacist dosing)         Does not apply See  admin instructions 05/07/22 2043     05/07/22 2030  vancomycin (VANCOCIN) IVPB 1000 mg/200 mL premix  Status:  Discontinued        1,000 mg 200 mL/hr over 60 Minutes Intravenous  Once 05/07/22 2029 05/07/22 2042   05/07/22 2030  aztreonam (AZACTAM) 2 g in sodium chloride 0.9 % 100 mL IVPB        2 g 200 mL/hr over 30 Minutes Intravenous  Once 05/07/22 2029 05/07/22 2226   05/07/22 1830  vancomycin  (VANCOCIN) IVPB 1000 mg/200 mL premix  Status:  Discontinued        1,000 mg 200 mL/hr over 60 Minutes Intravenous  Once 05/07/22 1829 05/07/22 2226   05/07/22 1830  levofloxacin (LEVAQUIN) IVPB 750 mg        750 mg 100 mL/hr over 90 Minutes Intravenous  Once 05/07/22 1829 05/07/22 2309   05/07/22 1830  clindamycin (CLEOCIN) IVPB 900 mg        900 mg 100 mL/hr over 30 Minutes Intravenous  Once 05/07/22 1829 05/07/22 1926      Subjective: Patient was seen and examined at bedside.  Overnight events noted.   Patient reports having significant pain in the right hand and right thumb area.  Objective: Vitals:   05/08/22 0545 05/08/22 0700 05/08/22 1013 05/08/22 1240  BP:  110/81 113/74 128/80  Pulse:  73 76 65  Resp:  16 16 15  $ Temp: 99.2 F (37.3 C)   98.3 F (36.8 C)  TempSrc: Oral     SpO2:  95% 97% 98%  Weight:      Height:        Intake/Output Summary (Last 24 hours) at 05/08/2022 1405 Last data filed at 05/08/2022 1245 Gross per 24 hour  Intake 1600 ml  Output 250 ml  Net 1350 ml   Filed Weights   05/07/22 1713  Weight: 65.8 kg    Examination:  General exam: Appears calm and comfortable ,very deconditioned. Respiratory system: Clear to auscultation. Respiratory effort normal.  RR16. Cardiovascular system: S1 & S2 heard, regular rate and rhythm, no murmur. Gastrointestinal system: Abdomen is soft, non tender, non distended, BS+ Central nervous system: Alert and oriented x 2. No focal neurological deficits. Extremities:  Right hand tender, swollen, warm and erythematous. Skin: No rashes, lesions or ulcers Psychiatry: Judgement and insight appear normal. Mood & affect appropriate.     Data Reviewed: I have personally reviewed following labs and imaging studies  CBC: Recent Labs  Lab 05/07/22 1823 05/08/22 0411  WBC 15.7* 15.3*  NEUTROABS 14.2*  --   HGB 13.2 11.3*  HCT 40.1 35.3*  MCV 83.2 86.3  PLT 165 123456*   Basic Metabolic Panel: Recent Labs  Lab  05/07/22 1823 05/07/22 1832 05/08/22 0411  NA 134*  --  134*  K 3.0*  --  3.8  CL 97*  --  103  CO2 25  --  23  GLUCOSE 101*  --  105*  BUN 31*  --  27*  CREATININE 3.31*  --  2.77*  CALCIUM 8.7*  --  7.9*  MG  --  1.9  --    GFR: Estimated Creatinine Clearance: 28.4 mL/min (A) (by C-G formula based on SCr of 2.77 mg/dL (H)). Liver Function Tests: Recent Labs  Lab 05/07/22 1823  AST 22  ALT 20  ALKPHOS 86  BILITOT 1.6*  PROT 8.1  ALBUMIN 3.7   No results for input(s): "LIPASE", "AMYLASE" in the last 168 hours. No results  for input(s): "AMMONIA" in the last 168 hours. Coagulation Profile: Recent Labs  Lab 05/07/22 1913 05/08/22 0411  INR 1.2 1.3*   Cardiac Enzymes: No results for input(s): "CKTOTAL", "CKMB", "CKMBINDEX", "TROPONINI" in the last 168 hours. BNP (last 3 results) No results for input(s): "PROBNP" in the last 8760 hours. HbA1C: No results for input(s): "HGBA1C" in the last 72 hours. CBG: No results for input(s): "GLUCAP" in the last 168 hours. Lipid Profile: No results for input(s): "CHOL", "HDL", "LDLCALC", "TRIG", "CHOLHDL", "LDLDIRECT" in the last 72 hours. Thyroid Function Tests: No results for input(s): "TSH", "T4TOTAL", "FREET4", "T3FREE", "THYROIDAB" in the last 72 hours. Anemia Panel: No results for input(s): "VITAMINB12", "FOLATE", "FERRITIN", "TIBC", "IRON", "RETICCTPCT" in the last 72 hours. Sepsis Labs: Recent Labs  Lab 05/07/22 1823 05/07/22 2042 05/08/22 0411  PROCALCITON  --   --  3.39  LATICACIDVEN 1.6 1.0  --     Recent Results (from the past 240 hour(s))  Blood Culture (routine x 2)     Status: None (Preliminary result)   Collection Time: 05/07/22  7:13 PM   Specimen: BLOOD  Result Value Ref Range Status   Specimen Description BLOOD LEFT ANTECUBITAL  Final   Special Requests   Final    BOTTLES DRAWN AEROBIC AND ANAEROBIC Blood Culture results may not be optimal due to an inadequate volume of blood received in culture  bottles   Culture   Final    NO GROWTH < 12 HOURS Performed at [facility], [address], [place], [place] [address]    Report Status PENDING  Incomplete  Blood Culture (routine x 2)     Status: None (Preliminary result)   Collection Time: 05/07/22  8:22 PM   Specimen: BLOOD  Result Value Ref Range Status   Specimen Description BLOOD HAND  Final   Special Requests   Final    BOTTLES DRAWN AEROBIC AND ANAEROBIC Blood Culture adequate volume   Culture   Final    NO GROWTH < 12 HOURS Performed at [facility], [address], [place], [place] [address]    Report Status PENDING  Incomplete    Radiology Studies: CT HAND RIGHT WO CONTRAST  Result Date: 05/07/2022 CLINICAL DATA:  Pain and swelling. Concern for abscess. Possible injury at work 2 days ago. EXAM: CT OF THE RIGHT HAND WITHOUT CONTRAST CT OF THE RIGHT FOREARM WITHOUT CONTRAST TECHNIQUE: Multidetector CT imaging of the right hand was performed according to the standard protocol. Multiplanar CT image reconstructions were also generated. RADIATION DOSE REDUCTION: This exam was performed according to the departmental dose-optimization program which includes automated exposure control, adjustment of the mA and/or kV according to patient size and/or use of iterative reconstruction technique. COMPARISON:  None Available. FINDINGS: Bones/Joint/Cartilage No fracture. No bone lesion. No bone resorption to suggest osteomyelitis. Elbow and wrist joints are normally spaced and aligned. No joint effusion. Ligaments Suboptimally assessed by CT. Muscles and Tendons Muscles unremarkable.  Tendons intact. Soft tissues Diffuse increased attenuation in the subcutaneous and deeper fat planes consistent with edema, possible cellulitis. No fluid collection to suggest an abscess. No soft tissue air. No radiopaque foreign body. IMPRESSION: 1. Diffuse forearm to wrist edema and/or cellulitis, but no evidence of an abscess. No soft tissue air  to suggest fasciitis. 2. No fracture or bone lesion. No evidence of osteomyelitis. No joint effusion to suggest septic arthropathy. Electronically Signed   By: Lajean Manes M.D.   On: 05/07/2022 19:48   CT FOREARM RIGHT  WO CONTRAST  Result Date: 05/07/2022 CLINICAL DATA:  Pain and swelling. Concern for abscess. Possible injury at work 2 days ago. EXAM: CT OF THE RIGHT HAND WITHOUT CONTRAST CT OF THE RIGHT FOREARM WITHOUT CONTRAST TECHNIQUE: Multidetector CT imaging of the right hand was performed according to the standard protocol. Multiplanar CT image reconstructions were also generated. RADIATION DOSE REDUCTION: This exam was performed according to the departmental dose-optimization program which includes automated exposure control, adjustment of the mA and/or kV according to patient size and/or use of iterative reconstruction technique. COMPARISON:  None Available. FINDINGS: Bones/Joint/Cartilage No fracture. No bone lesion. No bone resorption to suggest osteomyelitis. Elbow and wrist joints are normally spaced and aligned. No joint effusion. Ligaments Suboptimally assessed by CT. Muscles and Tendons Muscles unremarkable.  Tendons intact. Soft tissues Diffuse increased attenuation in the subcutaneous and deeper fat planes consistent with edema, possible cellulitis. No fluid collection to suggest an abscess. No soft tissue air. No radiopaque foreign body. IMPRESSION: 1. Diffuse forearm to wrist edema and/or cellulitis, but no evidence of an abscess. No soft tissue air to suggest fasciitis. 2. No fracture or bone lesion. No evidence of osteomyelitis. No joint effusion to suggest septic arthropathy. Electronically Signed   By: Lajean Manes M.D.   On: 05/07/2022 19:48   DG Hand 2 View Right  Result Date: 05/07/2022 CLINICAL DATA:  Concern for foreign body at first MCP joint. EXAM: RIGHT HAND - 2 VIEW COMPARISON:  Right hand radiograph Aug 19 2017 FINDINGS: There is no evidence of fracture or dislocation.  There is no evidence of arthropathy or other focal bone abnormality. There is a 2 mm superficial faint radiodensity within the soft tissues overlying the first MCP joint. There is also significant surrounding soft tissue edema. IMPRESSION: 1. No acute fracture or dislocation. 2. There is a 2 mm superficial faint radiodensity within the soft tissues overlying the first MCP joint, with significant surrounding edema, concerning for a foreign body. Electronically Signed   By: Beryle Flock M.D.   On: 05/07/2022 17:44    Scheduled Meds:  amLODipine  10 mg Oral Daily   aspirin EC  81 mg Oral Daily   atorvastatin  40 mg Oral q1800   enoxaparin (LOVENOX) injection  40 mg Subcutaneous Q24H   loratadine  10 mg Oral Daily   vancomycin variable dose per unstable renal function (pharmacist dosing)   Does not apply See admin instructions   Continuous Infusions:  0.9 % NaCl with KCl 20 mEq / L 100 mL/hr at 05/08/22 1246   aztreonam Stopped (05/08/22 0630)     LOS: 1 day    Time spent: 50 mins    Zya Finkle, MD [facility]   If 7PM-7AM, please contact night-coverage

## 2022-05-08 NOTE — Consult Note (Addendum)
Pharmacy Antibiotic Note  John Gregory is a 57 y.o. male admitted on 05/07/2022 with cellulitis.  Pharmacy has been consulted for aztreonam and vancomycin dosing. Patient with reported swelling or angioedema to PCN.  No known Cephalosporin use in past.   Xray/CT: with concerns of foreign object in 1st MTP, Diffuse swelling of forearm to wrist.  No evidence of abscess, septic joint or osteomyelitis.   Today, 05/08/2022 Day #1 vanco/aztreonam Renal: PMH CKD - Scr improved but above apparent baseline of 2-2.5 mg/dl WBC 15.3 Tm 101.7  Vancomycin levels:  2/12 Random vanco level = 19 mcg/mL at 04:11 Vanco 1gm admin 2/11 at 2038 Vanco 527m admin 2/11 at [place] Give Vancomycin 1gm IV x 1 dose and check random vancomycin level in 2/13 am Continue Aztreonam 1gm IV every 8 hours Attempt to further investigate PCN allergy Continue to monitor and dose adjust antibiotics according to renal function and indication Await orthopedic evaluation   ADDENDUM ID consulted.  Patient states he has taken keflex in past without any issues or allergic reaction.   - start Cefepime 2gm IV q24h based on tolerating keflex - Start metronidazole 506mIV q12h - COntinue vancomycin as above  DuDoreene ElandPharmD, BCPS, BCIDP Work Cell: [phone]/02/2023 3:43 PM    Height: 5' 10"$  (177.8 cm) Weight: 65.8 kg (145 lb) IBW/kg (Calculated) : 73  Temp (24hrs), Avg:99.9 F (37.7 C), Min:99 F (37.2 C), Max:101.7 F (38.7 C)  Recent Labs  Lab 05/07/22 1823 05/07/22 2042 05/08/22 0411  WBC 15.7*  --  15.3*  CREATININE 3.31*  --  2.77*  LATICACIDVEN 1.6 1.0  --   VANCORANDOM  --   --  19     Estimated Creatinine Clearance: 28.4 mL/min (A) (by C-G formula based on SCr of 2.77 mg/dL (H)).    Allergies  Allergen Reactions   Bactrim [Sulfamethoxazole-Trimethoprim] Itching   Calcium Other (See Comments)    Kidney stones   Penicillins Swelling    angioedema    Antimicrobials this  admission: 2/11 vancomycin >>  2/11 aztreonan >>    Microbiology results: 2/11 BCx: NGTD  Thank you for allowing pharmacy to be a part of this patient's care.  DuDoreene ElandPharmD, BCPS, BCIDP Work Cell: [phone]/02/2023 9:10 AM

## 2022-05-08 NOTE — Progress Notes (Signed)
PHARMACIST - PHYSICIAN COMMUNICATION  CONCERNING:  Enoxaparin (Lovenox) for DVT Prophylaxis    RECOMMENDATION: Patient was prescribed enoxaprin 62m q24 hours for VTE prophylaxis.   Filed Weights   05/07/22 1713  Weight: 65.8 kg (145 lb)    Body mass index is 20.81 kg/m.  Estimated Creatinine Clearance: 28.4 mL/min (A) (by C-G formula based on SCr of 2.77 mg/dL (H)).   Patient is candidate for enoxaparin 324mevery 24 hours based on CrCl <302min or Weight <45kg  DESCRIPTION: [facility]  Patient is now receiving enoxaparin 30 mg every 24 hours    JusAlison MurrayharmD Clinical Pharmacist  05/08/2022 3:53 PM

## 2022-05-08 NOTE — Consult Note (Signed)
NAME: John Gregory  DOB: [birth year]  MRN: [id number]  Date/Time: 05/08/2022 2:06 PM  REQUESTING PROVIDER: Dr.Kumar Subjective:  REASON FOR CONSULT: rt hand cellulitis History obtained from patient ? John Gregory is a 57 y.o. with a history of hypertension, CKD stage IIIb, osteoarthritis, dyslipidemia, urolithiasis, polycystic kidney , HLD Works in [employer] and was at work on Friday and later that night noted pain rt thumb- worsening swelling, fever and chills which made him come to the ED on Sunday 05/07/22- He thinks he could have injured his hand at work as he often gets scratches and tears. HE has 2 kittens at home but is not around them- He denies any cat scratches or bites No exposure to dirty water or soil No fishing In the ED vitals Were 154/108. Temp initially was 99.5 and then increased to 101.7 Pulse 121, sats 98% Xray hand revealed swelling near the thum and also questionable FB Blood culture sent and started on broad spectrum antibiotics Seen my ortho- who recommended MRI- ( CT without contrast just showed swelling) I am asked to see the patient for cellulitis Pt used to work as a Audiological scientist in [place]  Past Medical History:  Diagnosis Date   Arthritis    hands, shoulders   Chronic kidney disease (CKD), stage IIIb    HLD (hyperlipidemia)    Hypertension    Kidney stone    Kidney stone    Kidney stones    Polycystic kidney disease    Renal disorder    Wears contact lenses    Wears dentures    partial upper and lower    Past Surgical History:  Procedure Laterality Date   ANKLE SURGERY Left    EXCISION MASS NECK Left 10/31/2017   Procedure: EXCISION MASS NECK;  Surgeon: Carloyn Manner, MD;  Location: [place];  Service: ENT;  Laterality: Left;   INGUINAL HERNIA REPAIR Bilateral 07/25/2019   Procedure: HERNIA REPAIR INGUINAL ADULT;  Surgeon: Fredirick Maudlin, MD;  Location: [facility];  Service: General;  Laterality: Bilateral;   KNEE SURGERY  Bilateral    LEFT HEART CATH AND CORONARY ANGIOGRAPHY N/A 05/25/2021   Procedure: LEFT HEART CATH AND CORONARY ANGIOGRAPHY;  Surgeon: Nelva Bush, MD;  Location: [facility];  Service: Cardiovascular;  Laterality: N/A;   SHOULDER SURGERY Left    WRIST SURGERY Left     Social History   Socioeconomic History   Marital status: Married    Spouse name: Santiago Glad   Number of children: 4   Years of education: Not on file   Highest education level: Not on file  Occupational History   Not on file  Tobacco Use   Smoking status: Never   Smokeless tobacco: Current    Types: Snuff  Vaping Use   Vaping Use: Never used  Substance and Sexual Activity   Alcohol use: No   Drug use: No   Sexual activity: Not on file  Other Topics Concern   Not on file  Social History Narrative   Lives at home with wife and son/son's wife and their child.    Social Determinants of Health   Financial Resource Strain: Not on file  Food Insecurity: Not on file  Transportation Needs: Not on file  Physical Activity: Not on file  Stress: Not on file  Social Connections: Not on file  Intimate Partner Violence: Not on file    Family History  Problem Relation Age of Onset   Polycystic kidney disease Mother  Polycystic kidney disease Sister    Allergies  Allergen Reactions   Bactrim [Sulfamethoxazole-Trimethoprim] Itching   Calcium Other (See Comments)    Kidney stones   Penicillins Swelling    angioedema   I? Current Facility-Administered Medications  Medication Dose Route Frequency Provider Last Rate Last Admin   0.9 % NaCl with KCl 20 mEq/ L  infusion   Intravenous Continuous Mansy, Jan A, MD 100 mL/hr at 05/08/22 1246 Restarted at 05/08/22 1246   acetaminophen (TYLENOL) tablet 650 mg  650 mg Oral Q6H PRN Mansy, Jan A, MD   650 mg at 05/08/22 0940   Or   acetaminophen (TYLENOL) suppository 650 mg  650 mg Rectal Q6H PRN Mansy, Jan A, MD       amLODipine (NORVASC) tablet 10 mg  10 mg Oral  Daily Renda Rolls, RPH   10 mg at 05/08/22 1258   aspirin EC tablet 81 mg  81 mg Oral Daily Mansy, Jan A, MD   81 mg at 05/08/22 0926   atorvastatin (LIPITOR) tablet 40 mg  40 mg Oral q1800 BelueAlver Sorrow, RPH       aztreonam (AZACTAM) 1 g in sodium chloride 0.9 % 100 mL IVPB  1 g Intravenous Q8H Darrick Penna, RPH   Stopped at 05/08/22 0630   enoxaparin (LOVENOX) injection 40 mg  40 mg Subcutaneous Q24H Mansy, Jan A, MD   40 mg at 05/07/22 2123   loratadine (CLARITIN) tablet 10 mg  10 mg Oral Daily Mansy, Jan A, MD   10 mg at 05/08/22 0046   magnesium hydroxide (MILK OF MAGNESIA) suspension 30 mL  30 mL Oral Daily PRN Mansy, Jan A, MD       morphine (PF) 2 MG/ML injection 2 mg  2 mg Intravenous Q4H PRN Mansy, Jan A, MD       morphine (PF) 4 MG/ML injection 4 mg  4 mg Intravenous Q3H PRN Merlyn Lot, MD       morphine (PF) 4 MG/ML injection 4 mg  4 mg Intravenous Q3H PRN Merlyn Lot, MD   4 mg at 05/08/22 0016   ondansetron (ZOFRAN) tablet 4 mg  4 mg Oral Q6H PRN Mansy, Jan A, MD       Or   ondansetron [facility]) injection 4 mg  4 mg Intravenous Q6H PRN Mansy, Jan A, MD       oxyCODONE (Oxy IR/ROXICODONE) immediate release tablet 5 mg  5 mg Oral Q6H PRN Mansy, Jan A, MD   5 mg at 05/08/22 1120   simethicone (MYLICON) chewable tablet 80 mg  80 mg Oral QID PRN Mansy, Jan A, MD   80 mg at 05/07/22 2309   traZODone (DESYREL) tablet 25 mg  25 mg Oral QHS PRN Mansy, Jan A, MD       vancomycin variable dose per unstable renal function (pharmacist dosing)   Does not apply See admin instructions Darrick Penna, RPH         Abtx:  Anti-infectives (From admission, onward)    Start     Dose/Rate Route Frequency Ordered Stop   05/08/22 1000  vancomycin (VANCOCIN) IVPB 1000 mg/200 mL premix        1,000 mg 200 mL/hr over 60 Minutes Intravenous  Once 05/08/22 0910 05/08/22 1027   05/08/22 0600  aztreonam (AZACTAM) 1 g in sodium chloride 0.9 % 100 mL IVPB        1 g 200 mL/hr over  30 Minutes Intravenous Every 8  hours 05/07/22 2042     05/07/22 2115  vancomycin (VANCOREADY) IVPB 500 mg/100 mL        500 mg 100 mL/hr over 60 Minutes Intravenous  Once 05/07/22 2113 05/08/22 0012   05/07/22 2045  vancomycin (VANCOREADY) IVPB 1250 mg/250 mL  Status:  Discontinued        1,250 mg 166.7 mL/hr over 90 Minutes Intravenous  Once 05/07/22 2042 05/07/22 2113   05/07/22 2043  vancomycin variable dose per unstable renal function (pharmacist dosing)         Does not apply See admin instructions 05/07/22 2043     05/07/22 2030  vancomycin (VANCOCIN) IVPB 1000 mg/200 mL premix  Status:  Discontinued        1,000 mg 200 mL/hr over 60 Minutes Intravenous  Once 05/07/22 2029 05/07/22 2042   05/07/22 2030  aztreonam (AZACTAM) 2 g in sodium chloride 0.9 % 100 mL IVPB        2 g 200 mL/hr over 30 Minutes Intravenous  Once 05/07/22 2029 05/07/22 2226   05/07/22 1830  vancomycin (VANCOCIN) IVPB 1000 mg/200 mL premix  Status:  Discontinued        1,000 mg 200 mL/hr over 60 Minutes Intravenous  Once 05/07/22 1829 05/07/22 2226   05/07/22 1830  levofloxacin (LEVAQUIN) IVPB 750 mg        750 mg 100 mL/hr over 90 Minutes Intravenous  Once 05/07/22 1829 05/07/22 2309   05/07/22 1830  clindamycin (CLEOCIN) IVPB 900 mg        900 mg 100 mL/hr over 30 Minutes Intravenous  Once 05/07/22 1829 05/07/22 1926       REVIEW OF SYSTEMS:  Const:  fever,  chills, negative weight loss Eyes: negative diplopia or visual changes, negative eye pain ENT: negative coryza, negative sore throat Resp: negative cough, hemoptysis, dyspnea Cards: negative for chest pain, palpitations, lower extremity edema GU: negative for frequency, dysuria and hematuria GI: Negative for abdominal pain, diarrhea, bleeding, constipation Skin: negative for rash and pruritus Heme: negative for easy bruising and gum/nose bleeding MS: swelling and pain rt hand And rt forearm Neurolo:negative for headaches, dizziness, vertigo,  memory problems  Psych: negative for feelings of anxiety, depression  Endocrine: negative for thyroid, diabetes Allergy/Immunology- PCN- angioedema Says he has taken keflex and tolerated well Objective:  VITALS:  BP 128/80 (BP Location: Left Arm)   Pulse 65   Temp 98.3 F (36.8 C)   Resp 15   Ht 5' 10"$  (1.778 m)   Wt 65.8 kg   SpO2 98%   BMI 20.81 kg/m  By video  PHYSICAL EXAM:  General: Alert, cooperative, no distress at rest, appears stated age.  Head: Normocephalic, without obvious abnormality, atraumatic. Eyes: cannot be examined ENT not done Neck: , symmetrical,. Lungs: Not examined Heart: Not examined Abdomen: not examined Extremities:rt hand- prominent MCP of both thumbs- swollen rt side - tears Rt forearm swollen lymphangitic streak      Swollen thenar eminence  Skin: No rashes or lesions. Or bruising Lymph: cannot examine. Neurologic: Grossly non-focal Pertinent Labs Lab Results CBC    Component Value Date/Time   WBC 15.3 (H) 05/08/2022 0411   RBC 4.09 (L) 05/08/2022 0411   HGB 11.3 (L) 05/08/2022 0411   HCT 35.3 (L) 05/08/2022 0411   PLT 144 (L) 05/08/2022 0411   MCV 86.3 05/08/2022 0411   MCH 27.6 05/08/2022 0411   MCHC 32.0 05/08/2022 0411   RDW 12.9 05/08/2022 0411   LYMPHSABS 0.5 (L)  05/07/2022 1823   MONOABS 0.7 05/07/2022 1823   EOSABS 0.2 05/07/2022 1823   BASOSABS 0.0 05/07/2022 1823       Latest Ref Rng & Units 05/08/2022    4:11 AM 05/07/2022    6:23 PM 05/26/2021    5:12 AM  CMP  Glucose 70 - 99 mg/dL 105  101  118   BUN 6 - 20 mg/dL 27  31  24   $ Creatinine 0.61 - 1.24 mg/dL 2.77  3.31  2.28   Sodium 135 - 145 mmol/L 134  134  138   Potassium 3.5 - 5.1 mmol/L 3.8  3.0  3.4   Chloride 98 - 111 mmol/L 103  97  107   CO2 22 - 32 mmol/L 23  25  24   $ Calcium 8.9 - 10.3 mg/dL 7.9  8.7  8.3   Total Protein 6.5 - 8.1 g/dL  8.1    Total Bilirubin 0.3 - 1.2 mg/dL  1.6    Alkaline Phos 38 - 126 U/L  86    AST 15 - 41 U/L  22    ALT 0  - 44 U/L  20        Microbiology: Recent Results (from the past 240 hour(s))  Blood Culture (routine x 2)     Status: None (Preliminary result)   Collection Time: 05/07/22  7:13 PM   Specimen: BLOOD  Result Value Ref Range Status   Specimen Description BLOOD LEFT ANTECUBITAL  Final   Special Requests   Final    BOTTLES DRAWN AEROBIC AND ANAEROBIC Blood Culture results may not be optimal due to an inadequate volume of blood received in culture bottles   Culture   Final    NO GROWTH < 12 HOURS Performed at [facility], [address] [place], [place] [address]    Report Status PENDING  Incomplete  Blood Culture (routine x 2)     Status: None (Preliminary result)   Collection Time: 05/07/22  8:22 PM   Specimen: BLOOD  Result Value Ref Range Status   Specimen Description BLOOD HAND  Final   Special Requests   Final    BOTTLES DRAWN AEROBIC AND ANAEROBIC Blood Culture adequate volume   Culture   Final    NO GROWTH < 12 HOURS Performed at [facility], [address] [place], [place] [address]    Report Status PENDING  Incomplete    IMAGING RESULTS: Xray hand  Severe swelling around 1st MCP joint- ? FB I have personally reviewed the films ? Impression/Recommendation Cellulitis of the rt thumb and palm with lymphangitis and cellulitis of rt forearm following possible mechanical trauma to the hand- No exposure to dirty water or soil Common organisms staph/strep- because of exposure to metal may have to consider gram neg Currently on vanco and aztreonam Will change to linezolid/cefepime /flagyl Because of AKI on CKD avoid vanco Also linezolid has antitoxin effect Get MRI to look for abscess/tenosynovitis Which will warrant hand surgery Seen by ortho  CKD  Polycystic kidney ? HTN on amlodipine  HLD on atorvastatin? ___________________________________________________ Discussed with patient, requesting provider

## 2022-05-08 NOTE — Progress Notes (Signed)
Verbal order by MD Dwyane Dee, to dc tele. Order dc.

## 2022-05-08 NOTE — ED Notes (Signed)
Hospitalist has been notified about the patient's upper abd. Pain at this time through direct message. Waiting for a response.

## 2022-05-09 ENCOUNTER — Encounter (HOSPITAL_COMMUNITY): Payer: Self-pay

## 2022-05-09 ENCOUNTER — Inpatient Hospital Stay (HOSPITAL_COMMUNITY): Payer: Commercial Managed Care - PPO | Admitting: Certified Registered Nurse Anesthetist

## 2022-05-09 ENCOUNTER — Encounter (HOSPITAL_COMMUNITY)
Admission: EM | Disposition: A | Discharge status: Home / Self Care | Payer: Self-pay | Source: Other Acute Inpatient Hospital | Attending: Internal Medicine

## 2022-05-09 ENCOUNTER — Inpatient Hospital Stay (HOSPITAL_COMMUNITY)
Admission: EM | Admit: 2022-05-09 | Discharge: 2022-05-15 | DRG: 854 | Disposition: A | Discharge status: Home / Self Care | Payer: Commercial Managed Care - PPO | Source: Other Acute Inpatient Hospital | Attending: Internal Medicine | Admitting: Internal Medicine

## 2022-05-09 DIAGNOSIS — L02519 Cutaneous abscess of unspecified hand: Principal | ICD-10-CM

## 2022-05-09 DIAGNOSIS — L0390 Cellulitis, unspecified: Secondary | ICD-10-CM | POA: Diagnosis not present

## 2022-05-09 DIAGNOSIS — A419 Sepsis, unspecified organism: Principal | ICD-10-CM

## 2022-05-09 DIAGNOSIS — N189 Chronic kidney disease, unspecified: Secondary | ICD-10-CM | POA: Diagnosis not present

## 2022-05-09 DIAGNOSIS — Z7982 Long term (current) use of aspirin: Secondary | ICD-10-CM

## 2022-05-09 DIAGNOSIS — N179 Acute kidney failure, unspecified: Secondary | ICD-10-CM

## 2022-05-09 DIAGNOSIS — Z79899 Other long term (current) drug therapy: Secondary | ICD-10-CM

## 2022-05-09 DIAGNOSIS — L03113 Cellulitis of right upper limb: Secondary | ICD-10-CM | POA: Diagnosis present

## 2022-05-09 DIAGNOSIS — E785 Hyperlipidemia, unspecified: Secondary | ICD-10-CM | POA: Diagnosis present

## 2022-05-09 DIAGNOSIS — Q741 Congenital malformation of knee: Secondary | ICD-10-CM | POA: Diagnosis not present

## 2022-05-09 DIAGNOSIS — M1711 Unilateral primary osteoarthritis, right knee: Secondary | ICD-10-CM | POA: Diagnosis present

## 2022-05-09 DIAGNOSIS — L02511 Cutaneous abscess of right hand: Secondary | ICD-10-CM

## 2022-05-09 DIAGNOSIS — Z882 Allergy status to sulfonamides status: Secondary | ICD-10-CM | POA: Diagnosis not present

## 2022-05-09 DIAGNOSIS — Z87442 Personal history of urinary calculi: Secondary | ICD-10-CM | POA: Diagnosis not present

## 2022-05-09 DIAGNOSIS — I129 Hypertensive chronic kidney disease with stage 1 through stage 4 chronic kidney disease, or unspecified chronic kidney disease: Secondary | ICD-10-CM | POA: Diagnosis present

## 2022-05-09 DIAGNOSIS — A400 Sepsis due to streptococcus, group A: Principal | ICD-10-CM | POA: Diagnosis present

## 2022-05-09 DIAGNOSIS — N183 Chronic kidney disease, stage 3 unspecified: Secondary | ICD-10-CM | POA: Diagnosis present

## 2022-05-09 DIAGNOSIS — Q613 Polycystic kidney, unspecified: Secondary | ICD-10-CM

## 2022-05-09 DIAGNOSIS — M65141 Other infective (teno)synovitis, right hand: Secondary | ICD-10-CM | POA: Diagnosis present

## 2022-05-09 DIAGNOSIS — M1990 Unspecified osteoarthritis, unspecified site: Secondary | ICD-10-CM

## 2022-05-09 DIAGNOSIS — M25461 Effusion, right knee: Secondary | ICD-10-CM | POA: Diagnosis not present

## 2022-05-09 DIAGNOSIS — Z880 Allergy status to penicillin: Secondary | ICD-10-CM

## 2022-05-09 DIAGNOSIS — N1832 Chronic kidney disease, stage 3b: Secondary | ICD-10-CM | POA: Diagnosis present

## 2022-05-09 DIAGNOSIS — I10 Essential (primary) hypertension: Secondary | ICD-10-CM | POA: Diagnosis not present

## 2022-05-09 DIAGNOSIS — L03011 Cellulitis of right finger: Secondary | ICD-10-CM | POA: Diagnosis present

## 2022-05-09 DIAGNOSIS — Z8271 Family history of polycystic kidney: Secondary | ICD-10-CM | POA: Diagnosis not present

## 2022-05-09 DIAGNOSIS — M79641 Pain in right hand: Secondary | ICD-10-CM | POA: Diagnosis present

## 2022-05-09 DIAGNOSIS — B950 Streptococcus, group A, as the cause of diseases classified elsewhere: Secondary | ICD-10-CM | POA: Diagnosis present

## 2022-05-09 DIAGNOSIS — E872 Acidosis, unspecified: Secondary | ICD-10-CM | POA: Diagnosis present

## 2022-05-09 HISTORY — PX: I & D EXTREMITY: SHX5045

## 2022-05-09 LAB — CBC
HCT: 34.3 % — ABNORMAL LOW (ref 39.0–52.0)
Hemoglobin: 11.4 g/dL — ABNORMAL LOW (ref 13.0–17.0)
MCH: 27.7 pg (ref 26.0–34.0)
MCHC: 33.2 g/dL (ref 30.0–36.0)
MCV: 83.5 fL (ref 80.0–100.0)
Platelets: 177 10*3/uL (ref 150–400)
RBC: 4.11 MIL/uL — ABNORMAL LOW (ref 4.22–5.81)
RDW: 13.2 % (ref 11.5–15.5)
WBC: 13.7 10*3/uL — ABNORMAL HIGH (ref 4.0–10.5)
nRBC: 0 % (ref 0.0–0.2)

## 2022-05-09 LAB — BASIC METABOLIC PANEL
Anion gap: 9 (ref 5–15)
BUN: 16 mg/dL (ref 6–20)
CO2: 18 mmol/L — ABNORMAL LOW (ref 22–32)
Calcium: 8.2 mg/dL — ABNORMAL LOW (ref 8.9–10.3)
Chloride: 110 mmol/L (ref 98–111)
Creatinine, Ser: 2.16 mg/dL — ABNORMAL HIGH (ref 0.61–1.24)
GFR, Estimated: 35 mL/min — ABNORMAL LOW (ref >60)
Glucose, Bld: 85 mg/dL (ref 70–99)
Potassium: 4.2 mmol/L (ref 3.5–5.1)
Sodium: 137 mmol/L (ref 135–145)

## 2022-05-09 LAB — CREATININE, SERUM
Creatinine, Ser: 2.5 mg/dL — ABNORMAL HIGH (ref 0.61–1.24)
GFR, Estimated: 30 mL/min — ABNORMAL LOW (ref >60)

## 2022-05-09 LAB — VANCOMYCIN, RANDOM: Vancomycin Rm: 13 ug/mL

## 2022-05-09 SURGERY — IRRIGATION AND DEBRIDEMENT EXTREMITY
Anesthesia: General | Laterality: Right

## 2022-05-09 SURGERY — IRRIGATION AND DEBRIDEMENT EXTREMITY
Anesthesia: General | Site: Hand | Laterality: Right

## 2022-05-09 MED ORDER — FENTANYL CITRATE (PF) 250 MCG/5ML IJ SOLN
INTRAMUSCULAR | Status: DC | PRN
  Administered 2022-05-09 (×2): 25 ug via INTRAVENOUS

## 2022-05-09 MED ORDER — ONDANSETRON HCL 4 MG/2ML IJ SOLN
4.0000 mg | Freq: Four times a day (QID) | INTRAMUSCULAR | Status: DC | PRN
  Administered 2022-05-09 – 2022-05-13 (×3): 4 mg via INTRAVENOUS
  Filled 2022-05-09 (×3): qty 2

## 2022-05-09 MED ORDER — PROPOFOL 10 MG/ML IV BOLUS
INTRAVENOUS | Status: AC
  Filled 2022-05-09: qty 20

## 2022-05-09 MED ORDER — ACETAMINOPHEN 500 MG PO TABS: 1000.0000 mg | ORAL_TABLET | Freq: Once | ORAL | Status: DC | PRN

## 2022-05-09 MED ORDER — ONDANSETRON HCL 4 MG/2ML IJ SOLN
INTRAMUSCULAR | Status: DC | PRN
  Administered 2022-05-09: 4 mg via INTRAVENOUS

## 2022-05-09 MED ORDER — ACETAMINOPHEN 650 MG RE SUPP: 650.0000 mg | Freq: Four times a day (QID) | RECTAL | Status: DC | PRN

## 2022-05-09 MED ORDER — FENTANYL CITRATE (PF) 250 MCG/5ML IJ SOLN
INTRAMUSCULAR | Status: AC
  Filled 2022-05-09: qty 5

## 2022-05-09 MED ORDER — PROPOFOL 10 MG/ML IV BOLUS
INTRAVENOUS | Status: DC | PRN
  Administered 2022-05-09: 200 mg via INTRAVENOUS

## 2022-05-09 MED ORDER — SODIUM CHLORIDE 0.9 % IV SOLN: INTRAVENOUS | Status: DC

## 2022-05-09 MED ORDER — BUPIVACAINE HCL (PF) 0.25 % IJ SOLN
INTRAMUSCULAR | Status: DC | PRN
  Administered 2022-05-09: 5 mL

## 2022-05-09 MED ORDER — HEPARIN SODIUM (PORCINE) 5000 UNIT/ML IJ SOLN
5000.0000 [IU] | Freq: Three times a day (TID) | INTRAMUSCULAR | Status: DC
  Administered 2022-05-10 – 2022-05-15 (×16): 5000 [IU] via SUBCUTANEOUS
  Filled 2022-05-09 (×15): qty 1

## 2022-05-09 MED ORDER — HYDROMORPHONE HCL 1 MG/ML IJ SOLN
INTRAMUSCULAR | Status: AC
  Filled 2022-05-09: qty 1

## 2022-05-09 MED ORDER — DEXAMETHASONE SODIUM PHOSPHATE 10 MG/ML IJ SOLN
INTRAMUSCULAR | Status: DC | PRN
  Administered 2022-05-09: 5 mg via INTRAVENOUS

## 2022-05-09 MED ORDER — OXYCODONE HCL 5 MG PO TABS: 5.0000 mg | ORAL_TABLET | Freq: Once | ORAL | Status: DC | PRN

## 2022-05-09 MED ORDER — LIDOCAINE-EPINEPHRINE (PF) 1 %-1:200000 IJ SOLN
INTRAMUSCULAR | Status: AC
  Filled 2022-05-09: qty 30

## 2022-05-09 MED ORDER — POLYETHYLENE GLYCOL 3350 17 G PO PACK: 17.0000 g | PACK | Freq: Every day | ORAL | Status: DC | PRN

## 2022-05-09 MED ORDER — LINEZOLID 600 MG/300ML IV SOLN
600.0000 mg | Freq: Two times a day (BID) | INTRAVENOUS | Status: DC
  Administered 2022-05-10 – 2022-05-11 (×4): 600 mg via INTRAVENOUS
  Filled 2022-05-09 (×6): qty 300

## 2022-05-09 MED ORDER — HYDROMORPHONE HCL 1 MG/ML IJ SOLN
0.2500 mg | INTRAMUSCULAR | Status: DC | PRN
  Administered 2022-05-09 (×2): 0.5 mg via INTRAVENOUS

## 2022-05-09 MED ORDER — ACETAMINOPHEN 325 MG PO TABS
650.0000 mg | ORAL_TABLET | Freq: Four times a day (QID) | ORAL | Status: DC | PRN
  Administered 2022-05-12 (×2): 650 mg via ORAL
  Filled 2022-05-09 (×3): qty 2

## 2022-05-09 MED ORDER — ACETAMINOPHEN 10 MG/ML IV SOLN: 1000.0000 mg | Freq: Once | INTRAVENOUS | Status: DC | PRN

## 2022-05-09 MED ORDER — AMLODIPINE BESYLATE 10 MG PO TABS: 10.0000 mg | ORAL_TABLET | Freq: Every day | ORAL | Status: DC

## 2022-05-09 MED ORDER — ONDANSETRON HCL 4 MG PO TABS
4.0000 mg | ORAL_TABLET | Freq: Four times a day (QID) | ORAL | Status: DC | PRN
  Administered 2022-05-12 – 2022-05-14 (×2): 4 mg via ORAL
  Filled 2022-05-09 (×2): qty 1

## 2022-05-09 MED ORDER — LIDOCAINE HCL 1 % IJ SOLN
20.0000 mL | Freq: Once | INTRAMUSCULAR | Status: DC
  Filled 2022-05-09: qty 20

## 2022-05-09 MED ORDER — MIDAZOLAM HCL 2 MG/2ML IJ SOLN
INTRAMUSCULAR | Status: DC | PRN
  Administered 2022-05-09: 2 mg via INTRAVENOUS

## 2022-05-09 MED ORDER — LIDOCAINE HCL 1 % IJ SOLN
INTRAMUSCULAR | Status: DC | PRN
  Administered 2022-05-09: 5 mL

## 2022-05-09 MED ORDER — MIDAZOLAM HCL 2 MG/2ML IJ SOLN
INTRAMUSCULAR | Status: AC
  Filled 2022-05-09: qty 2

## 2022-05-09 MED ORDER — ASPIRIN 81 MG PO TBEC: 81.0000 mg | DELAYED_RELEASE_TABLET | Freq: Every day | ORAL | 12 refills | Status: AC

## 2022-05-09 MED ORDER — OXYCODONE HCL 5 MG PO TABS
5.0000 mg | ORAL_TABLET | ORAL | Status: DC | PRN
  Administered 2022-05-11 – 2022-05-12 (×2): 5 mg via ORAL
  Filled 2022-05-09 (×3): qty 1

## 2022-05-09 MED ORDER — PHENYLEPHRINE 80 MCG/ML (10ML) SYRINGE FOR IV PUSH (FOR BLOOD PRESSURE SUPPORT)
PREFILLED_SYRINGE | INTRAVENOUS | Status: DC | PRN
  Administered 2022-05-09 (×3): 80 ug via INTRAVENOUS

## 2022-05-09 MED ORDER — ATORVASTATIN CALCIUM 40 MG PO TABS
40.0000 mg | ORAL_TABLET | Freq: Every day | ORAL | Status: DC
  Administered 2022-05-10 – 2022-05-14 (×5): 40 mg via ORAL
  Filled 2022-05-09 (×5): qty 1

## 2022-05-09 MED ORDER — HYDRALAZINE HCL 20 MG/ML IJ SOLN: 5.0000 mg | INTRAMUSCULAR | Status: DC | PRN

## 2022-05-09 MED ORDER — LIDOCAINE HCL (PF) 1 % IJ SOLN
INTRAMUSCULAR | Status: AC
  Filled 2022-05-09: qty 30

## 2022-05-09 MED ORDER — HYDROMORPHONE HCL 1 MG/ML IJ SOLN
0.5000 mg | INTRAMUSCULAR | Status: DC | PRN
  Administered 2022-05-09 – 2022-05-12 (×14): 1 mg via INTRAVENOUS
  Filled 2022-05-09 (×14): qty 1

## 2022-05-09 MED ORDER — ONDANSETRON HCL 4 MG/2ML IJ SOLN
INTRAMUSCULAR | Status: AC
  Filled 2022-05-09: qty 2

## 2022-05-09 MED ORDER — ATORVASTATIN CALCIUM 40 MG PO TABS: 40.0000 mg | ORAL_TABLET | Freq: Every day | ORAL | 1 refills | Status: AC

## 2022-05-09 MED ORDER — FENTANYL CITRATE (PF) 100 MCG/2ML IJ SOLN: 25.0000 ug | INTRAMUSCULAR | Status: DC | PRN

## 2022-05-09 MED ORDER — PHENYLEPHRINE 80 MCG/ML (10ML) SYRINGE FOR IV PUSH (FOR BLOOD PRESSURE SUPPORT)
PREFILLED_SYRINGE | INTRAVENOUS | Status: AC
  Filled 2022-05-09: qty 10

## 2022-05-09 MED ORDER — 0.9 % SODIUM CHLORIDE (POUR BTL) OPTIME
TOPICAL | Status: DC | PRN
  Administered 2022-05-09: 1000 mL

## 2022-05-09 MED ORDER — BUPIVACAINE HCL (PF) 0.25 % IJ SOLN
INTRAMUSCULAR | Status: AC
  Filled 2022-05-09: qty 30

## 2022-05-09 MED ORDER — AMLODIPINE BESYLATE 10 MG PO TABS
10.0000 mg | ORAL_TABLET | Freq: Every day | ORAL | Status: DC
  Administered 2022-05-10 – 2022-05-15 (×6): 10 mg via ORAL
  Filled 2022-05-09 (×6): qty 1

## 2022-05-09 MED ORDER — METRONIDAZOLE 500 MG/100ML IV SOLN
500.0000 mg | Freq: Two times a day (BID) | INTRAVENOUS | Status: DC
  Administered 2022-05-09 – 2022-05-12 (×6): 500 mg via INTRAVENOUS
  Filled 2022-05-09 (×6): qty 100

## 2022-05-09 MED ORDER — SODIUM CHLORIDE 0.9 % IV SOLN
2.0000 g | INTRAVENOUS | Status: DC
  Administered 2022-05-09 – 2022-05-10 (×2): 2 g via INTRAVENOUS
  Filled 2022-05-09 (×2): qty 12.5

## 2022-05-09 MED ORDER — ACETAMINOPHEN 160 MG/5ML PO SOLN: 1000.0000 mg | Freq: Once | ORAL | Status: DC | PRN

## 2022-05-09 MED ORDER — DEXAMETHASONE SODIUM PHOSPHATE 10 MG/ML IJ SOLN
INTRAMUSCULAR | Status: AC
  Filled 2022-05-09: qty 1

## 2022-05-09 MED ORDER — OXYCODONE HCL 5 MG/5ML PO SOLN: 5.0000 mg | Freq: Once | ORAL | Status: DC | PRN

## 2022-05-09 SURGICAL SUPPLY — 56 items
ADAPTER CATH SYR TO TUBING 38M (ADAPTER) ×1 IMPLANT
ADPR CATH LL SYR 3/32 TPR (ADAPTER)
BAG COUNTER SPONGE SURGICOUNT (BAG) ×1 IMPLANT
BAG SPNG CNTER NS LX DISP (BAG)
BNDG CMPR 9X4 STRL LF SNTH (GAUZE/BANDAGES/DRESSINGS) ×1
BNDG COHESIVE 2X5 TAN STRL LF (GAUZE/BANDAGES/DRESSINGS) IMPLANT
BNDG ELASTIC 3X5.8 VLCR STR LF (GAUZE/BANDAGES/DRESSINGS) ×1 IMPLANT
BNDG ELASTIC 4X5.8 VLCR STR LF (GAUZE/BANDAGES/DRESSINGS) ×1 IMPLANT
BNDG ESMARK 4X9 LF (GAUZE/BANDAGES/DRESSINGS) IMPLANT
BNDG GAUZE DERMACEA FLUFF 4 (GAUZE/BANDAGES/DRESSINGS) ×1 IMPLANT
BNDG GZE DERMACEA 4 6PLY (GAUZE/BANDAGES/DRESSINGS) ×1
CANNULA VESSEL 3MM 2 BLNT TIP (CANNULA) IMPLANT
CORD BIPOLAR FORCEPS 12FT (ELECTRODE) ×1 IMPLANT
COVER SURGICAL LIGHT HANDLE (MISCELLANEOUS) ×1 IMPLANT
CUFF TOURN SGL QUICK 18X4 (TOURNIQUET CUFF) IMPLANT
CUFF TOURN SGL QUICK 24 (TOURNIQUET CUFF)
CUFF TRNQT CYL 24X4X16.5-23 (TOURNIQUET CUFF) IMPLANT
DRAIN PENROSE 1/4X12 LTX STRL (WOUND CARE) IMPLANT
GAUZE PACKING IODOFORM 1/4X15 (PACKING) IMPLANT
GAUZE PAD ABD 8X10 STRL (GAUZE/BANDAGES/DRESSINGS) ×2 IMPLANT
GAUZE SPONGE 4X4 12PLY STRL (GAUZE/BANDAGES/DRESSINGS) ×1 IMPLANT
GAUZE XEROFORM 1X8 LF (GAUZE/BANDAGES/DRESSINGS) ×1 IMPLANT
GLOVE BIO SURGEON STRL SZ7.5 (GLOVE) ×1 IMPLANT
GLOVE BIOGEL PI IND STRL 8 (GLOVE) ×1 IMPLANT
GLOVE BIOGEL PI IND STRL 8.5 (GLOVE) IMPLANT
GLOVE PI ORTHO PRO STRL SZ8 (GLOVE) IMPLANT
GLOVE SURG ORTHO 8.0 STRL STRW (GLOVE) IMPLANT
GOWN STRL REUS W/ TWL LRG LVL3 (GOWN DISPOSABLE) ×1 IMPLANT
GOWN STRL REUS W/ TWL XL LVL3 (GOWN DISPOSABLE) ×1 IMPLANT
GOWN STRL REUS W/TWL LRG LVL3 (GOWN DISPOSABLE) ×1
GOWN STRL REUS W/TWL XL LVL3 (GOWN DISPOSABLE) ×1
KIT BASIN OR (CUSTOM PROCEDURE TRAY) ×1 IMPLANT
KIT TURNOVER KIT B (KITS) ×1 IMPLANT
LOOP VESSEL MAXI BLUE (MISCELLANEOUS) IMPLANT
MANIFOLD NEPTUNE II (INSTRUMENTS) IMPLANT
NDL HYPO 25X1 1.5 SAFETY (NEEDLE) IMPLANT
NEEDLE HYPO 25X1 1.5 SAFETY (NEEDLE) ×1 IMPLANT
NS IRRIG 1000ML POUR BTL (IV SOLUTION) ×1 IMPLANT
PACK ORTHO EXTREMITY (CUSTOM PROCEDURE TRAY) ×1 IMPLANT
PAD ARMBOARD 7.5X6 YLW CONV (MISCELLANEOUS) ×2 IMPLANT
SET CYSTO W/LG BORE CLAMP LF (SET/KITS/TRAYS/PACK) IMPLANT
SPIKE FLUID TRANSFER (MISCELLANEOUS) ×1 IMPLANT
SPONGE T-LAP 4X18 ~~LOC~~+RFID (SPONGE) ×1 IMPLANT
SUT ETHILON 3 0 FSL (SUTURE) IMPLANT
SUT ETHILON 4 0 P 3 18 (SUTURE) IMPLANT
SUT ETHILON 4 0 PS 2 18 (SUTURE) IMPLANT
SUT MON AB 5-0 P3 18 (SUTURE) IMPLANT
SWAB COLLECTION DEVICE MRSA (MISCELLANEOUS) IMPLANT
SWAB CULTURE ESWAB REG 1ML (MISCELLANEOUS) IMPLANT
SYR 20ML LL LF (SYRINGE) ×1 IMPLANT
SYR CONTROL 10ML LL (SYRINGE) IMPLANT
TOWEL GREEN STERILE (TOWEL DISPOSABLE) ×1 IMPLANT
TUBE CONNECTING 12X1/4 (SUCTIONS) ×1 IMPLANT
TUBE FEEDING ENTERAL 5FR 16IN (TUBING) IMPLANT
UNDERPAD 30X36 HEAVY ABSORB (UNDERPADS AND DIAPERS) ×1 IMPLANT
YANKAUER SUCT BULB TIP NO VENT (SUCTIONS) ×1 IMPLANT

## 2022-05-09 NOTE — Transfer of Care (Signed)
Immediate Anesthesia Transfer of Care Note  Patient: John Gregory  Procedure(s) Performed: IRRIGATION AND DEBRIDEMENT RIGHT HAND ABSCESS (Right) IRRIGATION AND DEBRIDEMENT RIGHT HAND (Right: Hand)  Patient Location: PACU  Anesthesia Type:General  Level of Consciousness: awake, alert , and oriented  Airway & Oxygen Therapy: Patient Spontanous Breathing and Patient connected to face mask oxygen  Post-op Assessment: Report given to RN and Post -op Vital signs reviewed and stable  Post vital signs: Reviewed and stable  Last Vitals:  Vitals Value Taken Time  BP    Temp    Pulse    Resp    SpO2 98%     Last Pain:  Vitals:   05/09/22 2035  PainSc: 10-Worst pain ever      Patients Stated Pain Goal: 2 ([id number])  Complications: No notable events documented.

## 2022-05-09 NOTE — TOC Progression Note (Signed)
Transition of Care [facility]) - Progression Note    Patient Details  Name: Rembert Naugle MRN: [id number] Date of Birth: 04-07-1965  Transition of Care [facility]) CM/SW [place], RN Phone Number: 05/09/2022, 8:57 AM  Clinical Narrative:      Transition of Care [facility]) Screening Note   Patient Details  Name: Raburn Wigal Date of Birth: 1966/01/29   Transition of Care [facility] CM/SW Contact:    Conception Oms, RN Phone Number: 05/09/2022, 8:57 AM    Transition of Care Department [facility]) has reviewed patient and no TOC needs have been identified at this time. We will continue to monitor patient advancement through interdisciplinary progression rounds. If new patient transition needs arise, please place a TOC consult.         Expected Discharge Plan and Services                                               Social Determinants of Health (SDOH) Interventions SDOH Screenings   Food Insecurity: No Food Insecurity (05/08/2022)  Housing: Low Risk  (05/08/2022)  Transportation Needs: No Transportation Needs (05/08/2022)  Utilities: Not At Risk (05/08/2022)  Tobacco Use: High Risk (05/07/2022)    Readmission Risk Interventions     No data to display

## 2022-05-09 NOTE — Anesthesia Preprocedure Evaluation (Signed)
Anesthesia Evaluation  Patient identified by MRN, date of birth, ID band Patient awake    Reviewed: Allergy & Precautions, H&P , NPO status , Patient's Chart, lab work & pertinent test results  History of Anesthesia Complications Negative for: history of anesthetic complications  Airway Mallampati: II  TM Distance: >3 FB Neck ROM: Full    Dental  (+) Partial Lower, Partial Upper, Dental Advisory Given   Pulmonary neg pulmonary ROS   breath sounds clear to auscultation       Cardiovascular hypertension, Pt. on medications (-) angina (-) Past MI and (-) CHF  Rhythm:Regular     Neuro/Psych negative neurological ROS  negative psych ROS   GI/Hepatic negative GI ROS, Neg liver ROS,,,  Endo/Other  negative endocrine ROS    Renal/GU CRFRenal disease     Musculoskeletal  (+) Arthritis ,  Right hand infection   Abdominal   Peds  Hematology negative hematology ROS (+)   Anesthesia Other Findings   Reproductive/Obstetrics                             Anesthesia Physical Anesthesia Plan  ASA: 3  Anesthesia Plan: General   Post-op Pain Management:    Induction: Intravenous  PONV Risk Score and Plan: 2 and Ondansetron and Dexamethasone  Airway Management Planned: LMA  Additional Equipment: None  Intra-op Plan:   Post-operative Plan: Extubation in OR  Informed Consent: I have reviewed the patients History and Physical, chart, labs and discussed the procedure including the risks, benefits and alternatives for the proposed anesthesia with the patient or authorized representative who has indicated his/her understanding and acceptance.     Dental advisory given  Plan Discussed with: CRNA  Anesthesia Plan Comments:        Anesthesia Quick Evaluation

## 2022-05-09 NOTE — Anesthesia Procedure Notes (Signed)
Procedure Name: LMA Insertion Date/Time: 05/09/2022 9:30 PM  Performed by: Alain Marion, CRNAPre-anesthesia Checklist: Patient identified, Emergency Drugs available, Suction available and Patient being monitored Patient Re-evaluated:Patient Re-evaluated prior to induction Oxygen Delivery Method: Circle System Utilized Preoxygenation: Pre-oxygenation with 100% oxygen Induction Type: IV induction Ventilation: Mask ventilation without difficulty LMA: LMA inserted LMA Size: 5.0 Number of attempts: 1 Airway Equipment and Method: Bite block Placement Confirmation: positive ETCO2 Tube secured with: Tape Dental Injury: Teeth and Oropharynx as per pre-operative assessment

## 2022-05-09 NOTE — Consult Note (Signed)
Pharmacy Antibiotic Note  John Gregory is a 57 y.o. male admitted on 05/07/2022 with cellulitis.  Pharmacy has been consulted for cefepimedosing. Patient with reported swelling or angioedema to PCN.  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  No evidence of abscess, septic joint or osteomyelitis.   Today, 05/09/2022 Day #2 antibiotics - currently linezolid/cefepime/metronidazole vanco/aztreonam Renal: PMH CKD - Scr improved to 2.50 - may be close to his apparent baseline of 2-2.5 mg/dl WBC 15.3 Tm 101.7 - now afebrile  Vancomycin levels:  2/12 Random vanco level = 19 mcg/mL at 04:11 Vanco 1gm admin 2/11 at 2038 Vanco 520m admin 2/11 at 2Riley Continue cefepime 2gm IV q24h for now - borderline CrCl for increasing dse Continue to monitor and dose adjust antibiotics according to renal function and indication Await orthopedic evaluation [facility]  ADDENDUM ID consulted.  Patient states he has taken keflex in past without any issues or allergic reaction.   - start Cefepime 2gm IV q24h based on tolerating keflex - Start metronidazole 5021mIV q12h - COntinue vancomycin as above  DuDoreene ElandPharmD, BCPS, BCIDP Work Cell: [phone]/13/2024 2:46 PM    Height: 5' 10"$  (177.8 cm) Weight: 65.8 kg (145 lb) IBW/kg (Calculated) : 73  Temp (24hrs), Avg:98.5 F (36.9 C), Min:98.3 F (36.8 C), Max:98.7 F (37.1 C)  Recent Labs  Lab 05/07/22 1823 05/07/22 2042 05/08/22 0411 05/09/22 0326  WBC 15.7*  --  15.3*  --   CREATININE 3.31*  --  2.77* 2.50*  LATICACIDVEN 1.6 1.0  --   --   VANCORANDOM  --   --  19 13     Estimated Creatinine Clearance: 31.4 mL/min (A) (by C-G formula based on SCr of 2.5 mg/dL (H)).    Allergies  Allergen Reactions   Bactrim [Sulfamethoxazole-Trimethoprim]  Itching   Calcium Other (See Comments)    Kidney stones   Penicillins Swelling    angioedema    Antimicrobials this admission: 2/11 vancomycin >> 2/12 2/11 aztreonam >> 2/12 2/12 cefepime >> 2/13 linezolid >> 2/12 metronidazole   Microbiology results: 2/11 BCx: NGTD  Thank you for allowing pharmacy to be a part of this patient's care.  DuDoreene ElandPharmD, BCPS, BCIDP Work Cell: [phone]/13/2024 2:46 PM

## 2022-05-09 NOTE — Progress Notes (Signed)
ID MRI hand shows abscess first metacarpal area . He is being transferred to [facility] for care by hand surgeon-Will let the [facility] team know.

## 2022-05-09 NOTE — Progress Notes (Signed)
Report called to [facility] RN 2w [place]. Transportation in route. Pt aware, all questions answered. All belongings gathered

## 2022-05-09 NOTE — H&P (Signed)
TRH H&P   Patient Demographics:    John Gregory, is a 57 y.o. male  MRN: [id number]   DOB - [birth year]  Admit Date - 05/09/2022  Outpatient Primary MD for the patient is Letta Median, MD  Referring MD/NP/PA: Dr Dwyane Dee  Patient coming from: [facility]   No chief complaint on file.     HPI:    John Gregory  is a 57 y.o. male, with  PMH significant for hypertension, CKD stage IIIb, osteoarthritis, dyslipidemia, urolithiasis, polycystic kidney disease . -Patient presented to ED with acute onsets of right hand pain, presenting 3 days prior to admission, he reports swelling, erythema, tenderness, and induration to pain, he does not recall any injury to his hand, but he does work in a [employer], and he does sustain some injuries intermittently, but he cannot recall any specific trauma, or bite, he certainly denies any history of drug use, or IV drug injection. -Was admitted to [facility], he was treated with IV antibiotic, despite that no improvement of his swelling or pain, his workup was significant for creatinine 3.3, from baseline of 2.2, leukocytosis of 15.7, MRI obtained overnight showing abscess formation, tenosynovitis, orthopedic was consulted at [facility] who recommended transfer to [facility] for hand surgery, ID consulted as well who recommended bridge with Zyvox, cefepime and Flagyl and transferred to [facility].    Review of systems:     A full 10 point Review of Systems was done, except as stated above, all other Review of Systems were negative.   With Past History of the following :    Past Medical History:  Diagnosis Date   Arthritis    hands, shoulders   Chronic kidney disease (CKD), stage IIIb    HLD (hyperlipidemia)    Hypertension    Kidney stone    Kidney stone    Kidney stones    Polycystic kidney disease    Renal disorder     Wears contact lenses    Wears dentures    partial upper and lower      Past Surgical History:  Procedure Laterality Date   ANKLE SURGERY Left    EXCISION MASS NECK Left 10/31/2017   Procedure: EXCISION MASS NECK;  Surgeon: Carloyn Manner, MD;  Location: [facility];  Service: ENT;  Laterality: Left;   INGUINAL HERNIA REPAIR Bilateral 07/25/2019   Procedure: HERNIA REPAIR INGUINAL ADULT;  Surgeon: Fredirick Maudlin, MD;  Location: [facility];  Service: General;  Laterality: Bilateral;   KNEE SURGERY Bilateral    LEFT HEART CATH AND CORONARY ANGIOGRAPHY N/A 05/25/2021   Procedure: LEFT HEART CATH AND CORONARY ANGIOGRAPHY;  Surgeon: Nelva Bush, MD;  Location: [facility];  Service: Cardiovascular;  Laterality: N/A;   SHOULDER SURGERY Left    WRIST SURGERY Left       Social History:     Social History  Tobacco Use   Smoking status: Never   Smokeless tobacco: Current    Types: Snuff  Substance Use Topics   Alcohol use: No      Family History :     Family History  Problem Relation Age of Onset   Polycystic kidney disease Mother    Polycystic kidney disease Sister       Home Medications:   Prior to Admission medications   Medication Sig Start Date End Date Taking? Authorizing Provider  amLODipine (NORVASC) 10 MG tablet Take 1 tablet (10 mg total) by mouth daily. 02/11/20  Yes Lavonia Drafts, MD  benazepril-hydrochlorthiazide (LOTENSIN HCT) 20-12.5 MG tablet Take 1 tablet by mouth daily. 05/04/22  Yes [provider]  aspirin EC 81 MG tablet Take 1 tablet (81 mg total) by mouth daily. Swallow whole. 05/10/22   Shawna Clamp, MD  atorvastatin (LIPITOR) 40 MG tablet Take 1 tablet (40 mg total) by mouth daily at 6 PM. 05/09/22   Shawna Clamp, MD     Allergies:     Allergies  Allergen Reactions   Bactrim [Sulfamethoxazole-Trimethoprim] Itching   Calcium Other (See Comments)    Kidney stones   Penicillins Swelling    angioedema      Physical Exam:   Vitals Blood pressure 121/95, heart rate 91, respiratory rate 17, temperature 98.3   1. General well-developed male, laying in bed, no apparent distress  2. Normal affect and insight, Not Suicidal or Homicidal, Awake Alert, Oriented X 3.  3. No F.N deficits, ALL C.Nerves Intact, Strength 5/5 all 4 extremities, Sensation intact all 4 extremities, Plantars down going.  4. Ears and Eyes appear Normal, Conjunctivae clear, PERRLA. Moist Oral Mucosa.  5. Supple Neck, No JVD, No cervical lymphadenopathy appriciated, No Carotid Bruits.  6. Symmetrical Chest wall movement, Good air movement bilaterally, CTAB.  7. RRR, No Gallops, Rubs or Murmurs, No Parasternal Heave.  8. Positive Bowel Sounds, Abdomen Soft, No tenderness, No organomegaly appriciated,No rebound -guarding or rigidity.  9.  Right hand with erythema, swelling and tenderness to palpation mainly in the hypothenar area, with some mild right wrist swelling  10: Telemetry with no edema, clubbing or cyanosis      Data Review:    CBC Recent Labs  Lab 05/07/22 1823 05/08/22 0411  WBC 15.7* 15.3*  HGB 13.2 11.3*  HCT 40.1 35.3*  PLT 165 144*  MCV 83.2 86.3  MCH 27.4 27.6  MCHC 32.9 32.0  RDW 12.8 12.9  LYMPHSABS 0.5*  --   MONOABS 0.7  --   EOSABS 0.2  --   BASOSABS 0.0  --    ------------------------------------------------------------------------------------------------------------------  Chemistries  Recent Labs  Lab 05/07/22 1823 05/07/22 1832 05/08/22 0411 05/09/22 0326  NA 134*  --  134*  --   K 3.0*  --  3.8  --   CL 97*  --  103  --   CO2 25  --  23  --   GLUCOSE 101*  --  105*  --   BUN 31*  --  27*  --   CREATININE 3.31*  --  2.77* 2.50*  CALCIUM 8.7*  --  7.9*  --   MG  --  1.9  --   --   AST 22  --   --   --   ALT 20  --   --   --   ALKPHOS 86  --   --   --   BILITOT 1.6*  --   --   --     ------------------------------------------------------------------------------------------------------------------  estimated creatinine clearance is 31.4 mL/min (A) (by C-G formula based on SCr of 2.5 mg/dL (H)). ------------------------------------------------------------------------------------------------------------------ No results for input(s): "TSH", "T4TOTAL", "T3FREE", "THYROIDAB" in the last 72 hours.  Invalid input(s): "FREET3"  Coagulation profile Recent Labs  Lab 05/07/22 1913 05/08/22 0411  INR 1.2 1.3*   ------------------------------------------------------------------------------------------------------------------- No results for input(s): "DDIMER" in the last 72 hours. -------------------------------------------------------------------------------------------------------------------  Cardiac Enzymes No results for input(s): "CKMB", "TROPONINI", "MYOGLOBIN" in the last 168 hours.  Invalid input(s): "CK" ------------------------------------------------------------------------------------------------------------------ No results found for: "BNP"   ---------------------------------------------------------------------------------------------------------------  Urinalysis    Component Value Date/Time   COLORURINE COLORLESS (A) 11/06/2020 2308   APPEARANCEUR CLEAR (A) 11/06/2020 2308   LABSPEC 1.003 (L) 11/06/2020 2308   PHURINE 7.0 11/06/2020 2308   GLUCOSEU NEGATIVE 11/06/2020 2308   HGBUR NEGATIVE 11/06/2020 2308   BILIRUBINUR NEGATIVE 11/06/2020 2308   KETONESUR NEGATIVE 11/06/2020 2308   PROTEINUR NEGATIVE 11/06/2020 2308   NITRITE NEGATIVE 11/06/2020 2308   LEUKOCYTESUR NEGATIVE 11/06/2020 2308    ----------------------------------------------------------------------------------------------------------------   Imaging Results:    MR HAND RIGHT W WO CONTRAST  Result Date: 05/09/2022 CLINICAL DATA:  Hand cellulitis EXAM: MRI OF THE RIGHT HAND  WITHOUT AND WITH CONTRAST TECHNIQUE: Multiplanar, multisequence MR imaging of the right hand was performed before and after the administration of intravenous contrast. CONTRAST:  1m GADAVIST GADOBUTROL 1 MMOL/ML IV SOLN COMPARISON:  CT 05/07/2022, radiograph 05/07/2022 FINDINGS: Bones/Joint/Cartilage There is no evidence of acute fracture. There is mild to moderate osteoarthritis throughout the hand and wrist with associated mild subchondral marrow edema and cystic change most prominent at the base of thumb and thumb MCP joint. There is no significant marrow signal alteration suggesting osteomyelitis. There is a reactive DRUJ effusion. Ligaments Intact second through fifth MCP collateral ligaments. Suboptimal positioning to accurately evaluate the thumb collateral ligaments. Muscles and Tendons No acute tendon tear or significant tenosynovitis. There is mild reactive intramuscular edema along the radial aspect of the hand. No intramuscular fluid collection. Soft tissues There is extensive soft tissue swelling and enhancement most prominent along the radial aspect of the hand and wrist. There is a subcutaneous fluid collection with surrounding soft tissue enhancement measuring 2.0 x 0.7 cm in the axial dimension and 5.5 cm in length extending along the dorsolateral aspect of the first metacarpal to the proximal phalangeal base of the thumb (post-contrast axial image 12, coronal image 14). Focal area of soft tissue non enhancement lateral to the MCP joint measuring 1.9 x 0.5 x 1.5 cm (series 12, image 17, series 11, image 12). There is a blister-like skin lesion along the dorsal ulnar wrist measuring 0.9 cm (series 12, image 4). IMPRESSION: Extensive soft tissue cellulitis of the hand and wrist, most prominent along the radial aspect. Underlying subcutaneous soft tissue abscess measuring 2.0 x 0.7 cm in the axial dimension and 5.5 cm in length, which extends along the dorsolateral aspect of the first metacarpal to  the proximal phalangeal base of the thumb. Adjacent focal area of soft tissue non enhancement lateral to the MCP joint measuring 1.9 x 0.5 x 1.5 cm which could reflect devitalized soft tissue. No evidence of osteomyelitis or infectious tenosynovitis. Reactive intramuscular edema or myositis along the radial hand without intramuscular fluid collection or deep space abscess in the hand. Electronically Signed   By: JMaurine SimmeringM.D.   On: 05/09/2022 08:07   CT HAND RIGHT WO CONTRAST  Result Date: 05/07/2022 CLINICAL DATA:  Pain and swelling. Concern for abscess. Possible injury at work 2  days ago. EXAM: CT OF THE RIGHT HAND WITHOUT CONTRAST CT OF THE RIGHT FOREARM WITHOUT CONTRAST TECHNIQUE: Multidetector CT imaging of the right hand was performed according to the standard protocol. Multiplanar CT image reconstructions were also generated. RADIATION DOSE REDUCTION: This exam was performed according to the departmental dose-optimization program which includes automated exposure control, adjustment of the mA and/or kV according to patient size and/or use of iterative reconstruction technique. COMPARISON:  None Available. FINDINGS: Bones/Joint/Cartilage No fracture. No bone lesion. No bone resorption to suggest osteomyelitis. Elbow and wrist joints are normally spaced and aligned. No joint effusion. Ligaments Suboptimally assessed by CT. Muscles and Tendons Muscles unremarkable.  Tendons intact. Soft tissues Diffuse increased attenuation in the subcutaneous and deeper fat planes consistent with edema, possible cellulitis. No fluid collection to suggest an abscess. No soft tissue air. No radiopaque foreign body. IMPRESSION: 1. Diffuse forearm to wrist edema and/or cellulitis, but no evidence of an abscess. No soft tissue air to suggest fasciitis. 2. No fracture or bone lesion. No evidence of osteomyelitis. No joint effusion to suggest septic arthropathy. Electronically Signed   By: Lajean Manes M.D.   On: 05/07/2022  19:48   CT FOREARM RIGHT WO CONTRAST  Result Date: 05/07/2022 CLINICAL DATA:  Pain and swelling. Concern for abscess. Possible injury at work 2 days ago. EXAM: CT OF THE RIGHT HAND WITHOUT CONTRAST CT OF THE RIGHT FOREARM WITHOUT CONTRAST TECHNIQUE: Multidetector CT imaging of the right hand was performed according to the standard protocol. Multiplanar CT image reconstructions were also generated. RADIATION DOSE REDUCTION: This exam was performed according to the departmental dose-optimization program which includes automated exposure control, adjustment of the mA and/or kV according to patient size and/or use of iterative reconstruction technique. COMPARISON:  None Available. FINDINGS: Bones/Joint/Cartilage No fracture. No bone lesion. No bone resorption to suggest osteomyelitis. Elbow and wrist joints are normally spaced and aligned. No joint effusion. Ligaments Suboptimally assessed by CT. Muscles and Tendons Muscles unremarkable.  Tendons intact. Soft tissues Diffuse increased attenuation in the subcutaneous and deeper fat planes consistent with edema, possible cellulitis. No fluid collection to suggest an abscess. No soft tissue air. No radiopaque foreign body. IMPRESSION: 1. Diffuse forearm to wrist edema and/or cellulitis, but no evidence of an abscess. No soft tissue air to suggest fasciitis. 2. No fracture or bone lesion. No evidence of osteomyelitis. No joint effusion to suggest septic arthropathy. Electronically Signed   By: Lajean Manes M.D.   On: 05/07/2022 19:48      Assessment & Plan:    Principal Problem:   Hand abscess Active Problems:   Sepsis due to cellulitis ([place])   Cellulitis of multiple sites of right hand and fingers   Acute kidney injury superimposed on chronic kidney disease (HCC)   HLD (hyperlipidemia)   Chronic kidney disease (CKD), stage IIIb   Essential hypertension   Dyslipidemia   Sepsis, present on admission, due to right hand cellulitis/abscess/tenosynovitis   -Patient presents with progressive pain in the right hand, erythema, tenderness . -Initial imaging with no evidence of abscess, but MRI of right hand obtained yesterday showing right hand abscess/tenosynovitis . -Transferred from [facility] to [facility] for hand surgery evaluation  -  continue with broad-spectrum antibiotic including linezolid, cefepime and Flagyl per ID consult recommendation at [place] with as needed pain medication including oxycodone and Dilaudid  -Will await final recommendation from hand surgery. -Continue to trend procalcitonin  AKI on CKD stage IIIa -Continue to hold Lotensin/hydrochlorothiazide, continue  with IV fluids, avoid nephrotoxic medications  Dyslipidemia -Continue with Lipitor  Hypertension -Blood pressure acceptable, continue with Norvasc, hold home Lotensin/hydrochlorothiazide, will keep on as needed hydralazine.      DVT Prophylaxis Heparin  AM Labs Ordered, also please review Full Orders  Family Communication: Admission, patients condition and plan of care including tests being ordered have been discussed with the patient and  who indicate understanding and agree with the plan and Code Status.  Code Status full  Likely DC to  home  Condition GUARDED    Consults called: hand surgery  Dr Ala Bent  Admission status: inpatient    Time spent in minutes : 68 minutes   Phillips Climes M.D on 05/09/2022 at 5:41 PM   [facility] - Office  [phone]

## 2022-05-09 NOTE — Progress Notes (Signed)
PROGRESS NOTE    John Gregory  [id number] DOB: 1966/02/06 DOA: 05/07/2022 PCP: Letta Median, MD   Brief Narrative:  This 57 years old male with PMH significant for hypertension, CKD stage IIIb, osteoarthritis, dyslipidemia, urolithiasis, polycystic kidney disease presented in the ED with acute onset of right hand pain for 3 days associated with swelling, erythema,  tenderness,  warmth and induration with the pain.  Patient denies any injury to the hand.  He may have gotten dirt on his hand and stated if he may have poked it.  He has not been able to sleep from the pain.  Pain has gotten worse in the last 24 hours around the base of his right thumb.  Significant labs include creatinine 3.31 up from 2.28, WBC 15.7.  Right hand x-ray shows 2 mm superficial faint radiodensity within the soft tissue overlying the first MCP with significant surrounding edema concerning for a foreign body. No fracture or bone lesion, no evidence of osteomyelitis.  Patient is admitted for further evaluation.  Patient started on empiric antibiotics.  Orthopedics is consulted.  There is no evidence of fluid collection or fasciitis.  MRI obtained, ID consulted.  Assessment & Plan:   Principal Problem:   Sepsis due to cellulitis [facility]) Active Problems:   Cellulitis of multiple sites of right hand and fingers   Acute kidney injury superimposed on chronic kidney disease ([place])   Essential hypertension   Dyslipidemia   Peripheral neuropathy   Hypokalemia  Sepsis due to Hand cellulitis: Patient presented with worsening pain, erythema, redness, tenderness. Initiated on empiric antibiotic vancomycin and aztreonam. Continue adequate pain control with pain medications Warm compresses will be utilized. Continue IV fluid resuscitation. Infectious disease consulted.  Antibiotic changed to linezolid,cefepime and flagyl.. Sepsis physiology improving   Cellulitis of multiple sites in the right hand: Initiated  on IV vancomycin and aztreonam. Continue warm compresses, adequate pain control Orthopedics consulted.  There is no evidence of fluid collection or fasciitis. If condition worsens consider an MRI.  ID consulted. Antibiotic changed to linezolid, cefepime and flagyl as per ID.   AKI on CKD stage IIIa: Will hold Lotensin HCT and continue hydration with IV normal saline. Likely prerenal.  Continue to monitor serum creatinine. Patient is improving.  Hypokalemia: Replaced and resolved.  Dyslipidemia: Continue Lipitor 40 mg daily   Essential hypertension Continue amlodipine 10 mg daily.  DVT prophylaxis: Lovenox Code Status: Full code Family Communication: No family at bed side. Disposition Plan:   Status is: Inpatient Remains inpatient appropriate because: Admitted with right hand cellulitis.  Orthopedics is consulted,  infectious diseases consulted. MRI of the hand obtained.  Antibiotic changed to linezolid, cefepime and Flagyl  Consultants:  Orthopedic Infectious diseases  Procedures:   None Antimicrobials:  Anti-infectives (From admission, onward)    Start     Dose/Rate Route Frequency Ordered Stop   05/09/22 1000  linezolid (ZYVOX) IVPB 600 mg        600 mg 300 mL/hr over 60 Minutes Intravenous Every 12 hours 05/08/22 1631     05/08/22 1800  metroNIDAZOLE (FLAGYL) IVPB 500 mg        500 mg 100 mL/hr over 60 Minutes Intravenous Every 12 hours 05/08/22 1541     05/08/22 1800  ceFEPIme (MAXIPIME) 2 g in sodium chloride 0.9 % 100 mL IVPB        2 g 200 mL/hr over 30 Minutes Intravenous Every 24 hours 05/08/22 1542     05/08/22 1000  vancomycin (  VANCOCIN) IVPB 1000 mg/200 mL premix        1,000 mg 200 mL/hr over 60 Minutes Intravenous  Once 05/08/22 0910 05/08/22 1027   05/08/22 0600  aztreonam (AZACTAM) 1 g in sodium chloride 0.9 % 100 mL IVPB  Status:  Discontinued        1 g 200 mL/hr over 30 Minutes Intravenous Every 8 hours 05/07/22 2042 05/08/22 1541   05/07/22  2115  vancomycin (VANCOREADY) IVPB 500 mg/100 mL        500 mg 100 mL/hr over 60 Minutes Intravenous  Once 05/07/22 2113 05/08/22 0012   05/07/22 2045  vancomycin (VANCOREADY) IVPB 1250 mg/250 mL  Status:  Discontinued        1,250 mg 166.7 mL/hr over 90 Minutes Intravenous  Once 05/07/22 2042 05/07/22 2113   05/07/22 2043  vancomycin variable dose per unstable renal function (pharmacist dosing)  Status:  Discontinued         Does not apply See admin instructions 05/07/22 2043 05/08/22 1631   05/07/22 2030  vancomycin (VANCOCIN) IVPB 1000 mg/200 mL premix  Status:  Discontinued        1,000 mg 200 mL/hr over 60 Minutes Intravenous  Once 05/07/22 2029 05/07/22 2042   05/07/22 2030  aztreonam (AZACTAM) 2 g in sodium chloride 0.9 % 100 mL IVPB        2 g 200 mL/hr over 30 Minutes Intravenous  Once 05/07/22 2029 05/07/22 2226   05/07/22 1830  vancomycin (VANCOCIN) IVPB 1000 mg/200 mL premix  Status:  Discontinued        1,000 mg 200 mL/hr over 60 Minutes Intravenous  Once 05/07/22 1829 05/07/22 2226   05/07/22 1830  levofloxacin (LEVAQUIN) IVPB 750 mg        750 mg 100 mL/hr over 90 Minutes Intravenous  Once 05/07/22 1829 05/07/22 2309   05/07/22 1830  clindamycin (CLEOCIN) IVPB 900 mg        900 mg 100 mL/hr over 30 Minutes Intravenous  Once 05/07/22 1829 05/07/22 1926      Subjective: Patient was seen and examined at bedside.  Overnight events noted.   Patient reports having significant pain, swelling has improved after keeping it elevated.  Objective: Vitals:   05/08/22 1013 05/08/22 1240 05/08/22 2347 05/09/22 0743  BP: 113/74 128/80 112/69 125/75  Pulse: 76 65 74 76  Resp: 16 15 16 16  $ Temp:  98.3 F (36.8 C) 98.7 F (37.1 C) 98.6 F (37 C)  TempSrc:   Oral   SpO2: 97% 98% 97% 97%  Weight:      Height:        Intake/Output Summary (Last 24 hours) at 05/09/2022 1050 Last data filed at 05/09/2022 0902 Gross per 24 hour  Intake 1922.87 ml  Output 1750 ml  Net 172.87 ml    Filed Weights   05/07/22 1713  Weight: 65.8 kg    Examination:  General exam: Appears comfortable, not in any acute distress, very deconditioned. Respiratory system: Clear to auscultation. Respiratory effort normal.  RR16. Cardiovascular system: S1 & S2 heard, regular rate and rhythm, no murmur. Gastrointestinal system: Abdomen is soft, non tender, non distended, BS+ Central nervous system: Alert and oriented x 2. No focal neurological deficits. Extremities: Right hand Tender, swollen, warm, erythematous. Skin: No rashes, lesions or ulcers Psychiatry: Judgement and insight appear normal. Mood & affect appropriate.     Data Reviewed: I have personally reviewed following labs and imaging studies  CBC: Recent Labs  Lab  05/07/22 1823 05/08/22 0411  WBC 15.7* 15.3*  NEUTROABS 14.2*  --   HGB 13.2 11.3*  HCT 40.1 35.3*  MCV 83.2 86.3  PLT 165 123456*   Basic Metabolic Panel: Recent Labs  Lab 05/07/22 1823 05/07/22 1832 05/08/22 0411 05/09/22 0326  NA 134*  --  134*  --   K 3.0*  --  3.8  --   CL 97*  --  103  --   CO2 25  --  23  --   GLUCOSE 101*  --  105*  --   BUN 31*  --  27*  --   CREATININE 3.31*  --  2.77* 2.50*  CALCIUM 8.7*  --  7.9*  --   MG  --  1.9  --   --    GFR: Estimated Creatinine Clearance: 31.4 mL/min (A) (by C-G formula based on SCr of 2.5 mg/dL (H)). Liver Function Tests: Recent Labs  Lab 05/07/22 1823  AST 22  ALT 20  ALKPHOS 86  BILITOT 1.6*  PROT 8.1  ALBUMIN 3.7   No results for input(s): "LIPASE", "AMYLASE" in the last 168 hours. No results for input(s): "AMMONIA" in the last 168 hours. Coagulation Profile: Recent Labs  Lab 05/07/22 1913 05/08/22 0411  INR 1.2 1.3*   Cardiac Enzymes: No results for input(s): "CKTOTAL", "CKMB", "CKMBINDEX", "TROPONINI" in the last 168 hours. BNP (last 3 results) No results for input(s): "PROBNP" in the last 8760 hours. HbA1C: No results for input(s): "HGBA1C" in the last 72  hours. CBG: No results for input(s): "GLUCAP" in the last 168 hours. Lipid Profile: No results for input(s): "CHOL", "HDL", "LDLCALC", "TRIG", "CHOLHDL", "LDLDIRECT" in the last 72 hours. Thyroid Function Tests: No results for input(s): "TSH", "T4TOTAL", "FREET4", "T3FREE", "THYROIDAB" in the last 72 hours. Anemia Panel: No results for input(s): "VITAMINB12", "FOLATE", "FERRITIN", "TIBC", "IRON", "RETICCTPCT" in the last 72 hours. Sepsis Labs: Recent Labs  Lab 05/07/22 1823 05/07/22 2042 05/08/22 0411  PROCALCITON  --   --  3.39  LATICACIDVEN 1.6 1.0  --     Recent Results (from the past 240 hour(s))  Blood Culture (routine x 2)     Status: None (Preliminary result)   Collection Time: 05/07/22  7:13 PM   Specimen: BLOOD  Result Value Ref Range Status   Specimen Description BLOOD LEFT ANTECUBITAL  Final   Special Requests   Final    BOTTLES DRAWN AEROBIC AND ANAEROBIC Blood Culture results may not be optimal due to an inadequate volume of blood received in culture bottles   Culture   Final    NO GROWTH 2 DAYS Performed at [facility], [address] [place], [place] [address]    Report Status PENDING  Incomplete  Blood Culture (routine x 2)     Status: None (Preliminary result)   Collection Time: 05/07/22  8:22 PM   Specimen: BLOOD  Result Value Ref Range Status   Specimen Description BLOOD HAND  Final   Special Requests   Final    BOTTLES DRAWN AEROBIC AND ANAEROBIC Blood Culture adequate volume   Culture   Final    NO GROWTH 2 DAYS Performed at [facility], [address] [place], [place] [address]    Report Status PENDING  Incomplete    Radiology Studies: MR HAND RIGHT W WO CONTRAST  Result Date: 05/09/2022 CLINICAL DATA:  Hand cellulitis EXAM: MRI OF THE RIGHT HAND WITHOUT AND WITH CONTRAST TECHNIQUE: Multiplanar, multisequence MR imaging of the right  hand was performed before and after the administration of intravenous contrast. CONTRAST:   37m GADAVIST GADOBUTROL 1 MMOL/ML IV SOLN COMPARISON:  CT 05/07/2022, radiograph 05/07/2022 FINDINGS: Bones/Joint/Cartilage There is no evidence of acute fracture. There is mild to moderate osteoarthritis throughout the hand and wrist with associated mild subchondral marrow edema and cystic change most prominent at the base of thumb and thumb MCP joint. There is no significant marrow signal alteration suggesting osteomyelitis. There is a reactive DRUJ effusion. Ligaments Intact second through fifth MCP collateral ligaments. Suboptimal positioning to accurately evaluate the thumb collateral ligaments. Muscles and Tendons No acute tendon tear or significant tenosynovitis. There is mild reactive intramuscular edema along the radial aspect of the hand. No intramuscular fluid collection. Soft tissues There is extensive soft tissue swelling and enhancement most prominent along the radial aspect of the hand and wrist. There is a subcutaneous fluid collection with surrounding soft tissue enhancement measuring 2.0 x 0.7 cm in the axial dimension and 5.5 cm in length extending along the dorsolateral aspect of the first metacarpal to the proximal phalangeal base of the thumb (post-contrast axial image 12, coronal image 14). Focal area of soft tissue non enhancement lateral to the MCP joint measuring 1.9 x 0.5 x 1.5 cm (series 12, image 17, series 11, image 12). There is a blister-like skin lesion along the dorsal ulnar wrist measuring 0.9 cm (series 12, image 4). IMPRESSION: Extensive soft tissue cellulitis of the hand and wrist, most prominent along the radial aspect. Underlying subcutaneous soft tissue abscess measuring 2.0 x 0.7 cm in the axial dimension and 5.5 cm in length, which extends along the dorsolateral aspect of the first metacarpal to the proximal phalangeal base of the thumb. Adjacent focal area of soft tissue non enhancement lateral to the MCP joint measuring 1.9 x 0.5 x 1.5 cm which could reflect devitalized  soft tissue. No evidence of osteomyelitis or infectious tenosynovitis. Reactive intramuscular edema or myositis along the radial hand without intramuscular fluid collection or deep space abscess in the hand. Electronically Signed   By: JMaurine SimmeringM.D.   On: 05/09/2022 08:07   CT HAND RIGHT WO CONTRAST  Result Date: 05/07/2022 CLINICAL DATA:  Pain and swelling. Concern for abscess. Possible injury at work 2 days ago. EXAM: CT OF THE RIGHT HAND WITHOUT CONTRAST CT OF THE RIGHT FOREARM WITHOUT CONTRAST TECHNIQUE: Multidetector CT imaging of the right hand was performed according to the standard protocol. Multiplanar CT image reconstructions were also generated. RADIATION DOSE REDUCTION: This exam was performed according to the departmental dose-optimization program which includes automated exposure control, adjustment of the mA and/or kV according to patient size and/or use of iterative reconstruction technique. COMPARISON:  None Available. FINDINGS: Bones/Joint/Cartilage No fracture. No bone lesion. No bone resorption to suggest osteomyelitis. Elbow and wrist joints are normally spaced and aligned. No joint effusion. Ligaments Suboptimally assessed by CT. Muscles and Tendons Muscles unremarkable.  Tendons intact. Soft tissues Diffuse increased attenuation in the subcutaneous and deeper fat planes consistent with edema, possible cellulitis. No fluid collection to suggest an abscess. No soft tissue air. No radiopaque foreign body. IMPRESSION: 1. Diffuse forearm to wrist edema and/or cellulitis, but no evidence of an abscess. No soft tissue air to suggest fasciitis. 2. No fracture or bone lesion. No evidence of osteomyelitis. No joint effusion to suggest septic arthropathy. Electronically Signed   By: DLajean ManesM.D.   On: 05/07/2022 19:48   CT FOREARM RIGHT WO CONTRAST  Result Date: 05/07/2022 CLINICAL DATA:  Pain and swelling. Concern for abscess. Possible injury at work 2 days ago. EXAM: CT OF THE RIGHT  HAND WITHOUT CONTRAST CT OF THE RIGHT FOREARM WITHOUT CONTRAST TECHNIQUE: Multidetector CT imaging of the right hand was performed according to the standard protocol. Multiplanar CT image reconstructions were also generated. RADIATION DOSE REDUCTION: This exam was performed according to the departmental dose-optimization program which includes automated exposure control, adjustment of the mA and/or kV according to patient size and/or use of iterative reconstruction technique. COMPARISON:  None Available. FINDINGS: Bones/Joint/Cartilage No fracture. No bone lesion. No bone resorption to suggest osteomyelitis. Elbow and wrist joints are normally spaced and aligned. No joint effusion. Ligaments Suboptimally assessed by CT. Muscles and Tendons Muscles unremarkable.  Tendons intact. Soft tissues Diffuse increased attenuation in the subcutaneous and deeper fat planes consistent with edema, possible cellulitis. No fluid collection to suggest an abscess. No soft tissue air. No radiopaque foreign body. IMPRESSION: 1. Diffuse forearm to wrist edema and/or cellulitis, but no evidence of an abscess. No soft tissue air to suggest fasciitis. 2. No fracture or bone lesion. No evidence of osteomyelitis. No joint effusion to suggest septic arthropathy. Electronically Signed   By: Lajean Manes M.D.   On: 05/07/2022 19:48   DG Hand 2 View Right  Result Date: 05/07/2022 CLINICAL DATA:  Concern for foreign body at first MCP joint. EXAM: RIGHT HAND - 2 VIEW COMPARISON:  Right hand radiograph Aug 19 2017 FINDINGS: There is no evidence of fracture or dislocation. There is no evidence of arthropathy or other focal bone abnormality. There is a 2 mm superficial faint radiodensity within the soft tissues overlying the first MCP joint. There is also significant surrounding soft tissue edema. IMPRESSION: 1. No acute fracture or dislocation. 2. There is a 2 mm superficial faint radiodensity within the soft tissues overlying the first MCP  joint, with significant surrounding edema, concerning for a foreign body. Electronically Signed   By: Beryle Flock M.D.   On: 05/07/2022 17:44    Scheduled Meds:  amLODipine  10 mg Oral Daily   aspirin EC  81 mg Oral Daily   atorvastatin  40 mg Oral q1800   docusate sodium  100 mg Oral BID   enoxaparin (LOVENOX) injection  30 mg Subcutaneous Q24H   loratadine  10 mg Oral Daily   senna  1 tablet Oral BID   Continuous Infusions:  0.9 % NaCl with KCl 20 mEq / L 100 mL/hr at 05/09/22 0841   ceFEPime (MAXIPIME) IV Stopped (05/08/22 1838)   linezolid (ZYVOX) IV 600 mg (05/09/22 1040)   metronidazole 500 mg (05/09/22 0556)     LOS: 2 days    Time spent: 35 mins    Kamilya Wakeman, MD [facility]   If 7PM-7AM, please contact night-coverage

## 2022-05-09 NOTE — Consult Note (Signed)
Orthopaedic Surgery Hand and Upper Extremity History and Physical Examination 05/09/2022  Referring Provider: Shawna Clamp, MD [address] [place],  [place] [address]  CC: Right hand abscess  HPI: John Gregory is a 57 y.o. male who presented to the [facility] on 2/11 after 3 days of right hand pain.  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  He was found to have sepsis and was admitted and started on antibiotics.  Initial Ortho consult recommended CT scan but no abscess was visible.  ID and Ortho consults recommended MRI which demonstrated abscess.  Patient transferred to [facility].  Pain primarily on dorsal hand.     Past Medical History: Past Medical History:  Diagnosis Date   Arthritis    hands, shoulders   Chronic kidney disease (CKD), stage IIIb    HLD (hyperlipidemia)    Hypertension    Kidney stone    Kidney stone    Kidney stones    Polycystic kidney disease    Renal disorder    Wears contact lenses    Wears dentures    partial upper and lower     Medications: Scheduled Meds:  [START ON 05/10/2022] amLODipine  10 mg Oral Daily   [START ON 05/10/2022] atorvastatin  40 mg Oral q1800   heparin  5,000 Units Subcutaneous Q8H   lidocaine  20 mL Other Once   Continuous Infusions:  sodium chloride 75 mL/hr at 05/09/22 1814   ceFEPime (MAXIPIME) IV 2 g (05/09/22 1815)   linezolid (ZYVOX) IV     metronidazole 500 mg (05/09/22 1820)   PRN Meds:.acetaminophen **OR** acetaminophen, hydrALAZINE, HYDROmorphone (DILAUDID) injection, ondansetron **OR** ondansetron (ZOFRAN) IV, oxyCODONE  Allergies: Allergies as of 05/09/2022 - Reviewed 05/09/2022  Allergen Reaction Noted   Bactrim [sulfamethoxazole-trimethoprim] Itching 10/31/2017   Calcium Other (See Comments) 04/14/2014   Penicillins Swelling 02/13/2015    Past Surgical History: Past Surgical History:  Procedure  Laterality Date   ANKLE SURGERY Left    EXCISION MASS NECK Left 10/31/2017   Procedure: EXCISION MASS NECK;  Surgeon: Carloyn Manner, MD;  Location: [place];  Service: ENT;  Laterality: Left;   INGUINAL HERNIA REPAIR Bilateral 07/25/2019   Procedure: HERNIA REPAIR INGUINAL ADULT;  Surgeon: Fredirick Maudlin, MD;  Location: [facility];  Service: General;  Laterality: Bilateral;   KNEE SURGERY Bilateral    LEFT HEART CATH AND CORONARY ANGIOGRAPHY N/A 05/25/2021   Procedure: LEFT HEART CATH AND CORONARY ANGIOGRAPHY;  Surgeon: Nelva Bush, MD;  Location: [place] [facility];  Service: Cardiovascular;  Laterality: N/A;   SHOULDER SURGERY Left    WRIST SURGERY Left      Social History: Social History   Occupational History   Not on file  Tobacco Use   Smoking status: Never   Smokeless tobacco: Current    Types: Snuff  Vaping Use   Vaping Use: Never used  Substance and Sexual Activity   Alcohol use: No   Drug use: No   Sexual activity: Not on file     Family History: Family History  Problem Relation Age of Onset   Polycystic kidney disease Mother    Polycystic kidney disease Sister    Otherwise, no relevant orthopaedic family history  ROS: Review of Systems: All systems reviewed and are negative except that mentioned in HPI  Work/Sport/Hobbies: See HPI  Physical Examination: There were no vitals filed for this visit.  Constitutional: Awake, alert.  WN/WD Appearance: healthy, no acute distress, well-groomed Affect: Normal HEENT: EOMI, mucous membranes moist CV: RRR Pulm: breathing comfortably  Right Upper Extremity / Hand Hand swollen primiarily on dorsal thumb.  Warm to touch.  Erythema present.  Reportedly had streaking up arm but currently does not.  Able to flex thumb IP joint but difficulty extending beyond resting position.  Sensation intact in median, radial, and ulnar distributions.  Motor intact in AIN, PIN, and ulnar distributions.    Pertinent  Labs: n/a  Imaging: I have personally reviewed the following studies: Plain films and MRI reviewed and demonstrate dorsal thumb abscess and possible foreign body.    Additional Studies: n/a  Assessment/Plan: Right hand cellulitis and abscess  Discussed with patient performing I&D at bedside.  Patient too tender so will plan to take to OR for I&D after patient.  Patient in agreement with plan.  All questions were answered.   Wadie Lessen, MD Hand and Upper Extremity Surgery [facility] [phone] 05/09/2022 8:53 PM

## 2022-05-09 NOTE — Discharge Instructions (Signed)
Patient is being transferred to [facility] for right hand surgery.

## 2022-05-09 NOTE — Discharge Summary (Signed)
Physician Discharge Summary  John Gregory [id number] DOB: 03-02-1966 DOA: 05/07/2022  PCP: Letta Median, MD  Admit date: 05/07/2022  Discharge date: 05/09/2022  Admitted From: Home  Disposition:  [facility]  Recommendations for Outpatient Follow-up:  Follow up with PCP in 1-2 weeks. Please obtain BMP/CBC in one week. Patient is being transferred to [facility] for right hand surgery. Please consult hand surgery on patient's arrival. Please continue linezolid, cefepime and Flagyl as per ID recommendation.  Home Health:None Equipment/Devices:None  Discharge Condition: Stable CODE STATUS:Full code Diet recommendation: Heart Healthy   Brief [facility] Course: This 57 years old male with PMH significant for hypertension, CKD stage IIIb, osteoarthritis, dyslipidemia, urolithiasis, polycystic kidney disease presented in the ED with acute onset of right hand pain for 3 days associated with swelling, erythema,  tenderness,  warmth and induration with the pain.  Patient denies any injury to the hand.  He may have gotten dirt on his hand and stated if he may have poked it.  He has not been able to sleep from the pain.  Pain has gotten worse in the last 24 hours around the base of his right thumb.  Significant labs include creatinine 3.31 up from 2.28, WBC 15.7.  Right hand x-ray shows 2 mm superficial faint radiodensity within the soft tissue overlying the first MCP with significant surrounding edema concerning for a foreign body. No fracture or bone lesion, no evidence of osteomyelitis.  Patient was admitted for further evaluation.  Patient started on empiric antibiotics.  Orthopedics is consulted.  There is no evidence of fluid collection or fasciitis.  MRI hand showed abscess formation. ID consulted.  Antibiotics changed to linezolid, cefepime and metronidazole.  Orthopedic surgery recommended transfer patient to [facility] for hand surgery.  Patient is being transferred to  [facility].  Discharge Diagnoses:  Principal Problem:   Sepsis due to cellulitis [facility]) Active Problems:   Cellulitis of multiple sites of right hand and fingers   Acute kidney injury superimposed on chronic kidney disease ([place])   Essential hypertension   Dyslipidemia   Peripheral neuropathy   Hypokalemia  Sepsis due to Hand cellulitis: Patient presented with worsening pain, erythema, redness, tenderness. Initiated on empiric antibiotic vancomycin and aztreonam. Continue adequate pain control with pain medications Warm compresses will be utilized. Continue IV fluid resuscitation. Infectious disease consulted.  Antibiotic changed to linezolid,cefepime and flagyl.. Sepsis physiology improving. MRI confirms abscess Patient is being transferred to [facility] for hand surgery.   Cellulitis of multiple sites in the right hand: Initiated on IV vancomycin and aztreonam. Continue warm compresses, adequate pain control Orthopedics consulted.  There is no evidence of fluid collection or fasciitis. If condition worsens consider an MRI.  ID consulted. Antibiotic changed to linezolid, cefepime and flagyl as per ID.   AKI on CKD stage IIIa: Will hold Lotensin HCT and continue hydration with IV normal saline. Likely prerenal.  Continue to monitor serum creatinine. Serum creatinine improving.   Hypokalemia: Replaced and resolved.   Dyslipidemia: Continue Lipitor 40 mg daily   Essential hypertension Continue amlodipine 10 mg daily.  Discharge Instructions  Discharge Instructions     Call MD for:  persistant dizziness or light-headedness   Complete by: As directed    Call MD for:  persistant nausea and vomiting   Complete by: As directed    Diet - low sodium heart healthy   Complete by: As directed    Diet Carb Modified   Complete by: As directed  Discharge instructions   Complete by: As directed    Patient is being transferred to [facility] for right hand surgery.    Increase activity slowly   Complete by: As directed       Allergies as of 05/09/2022       Reactions   Bactrim [sulfamethoxazole-trimethoprim] Itching   Calcium Other (See Comments)   Kidney stones   Penicillins Swelling   angioedema        Medication List     STOP taking these medications    gabapentin 800 MG tablet Commonly known as: NEURONTIN   oxyCODONE 5 MG immediate release tablet Commonly known as: Oxy IR/ROXICODONE   predniSONE 10 MG tablet Commonly known as: DELTASONE       TAKE these medications    amLODipine 10 MG tablet Commonly known as: NORVASC Take 1 tablet (10 mg total) by mouth daily.   aspirin EC 81 MG tablet Take 1 tablet (81 mg total) by mouth daily. Swallow whole. Start taking on: May 10, 2022   atorvastatin 40 MG tablet Commonly known as: LIPITOR Take 1 tablet (40 mg total) by mouth daily at 6 PM. What changed: when to take this   benazepril-hydrochlorthiazide 20-12.5 MG tablet Commonly known as: LOTENSIN HCT Take 1 tablet by mouth daily.        Follow-up Information     Bender, Durene Cal, MD Follow up in 1 week(s).   Specialty: Family Medicine Contact information: [place] [id number] [phone]                Allergies  Allergen Reactions   Bactrim [Sulfamethoxazole-Trimethoprim] Itching   Calcium Other (See Comments)    Kidney stones   Penicillins Swelling    angioedema    Consultations: ID Orthopeadics   Procedures/Studies: MR HAND RIGHT W WO CONTRAST  Result Date: 05/09/2022 CLINICAL DATA:  Hand cellulitis EXAM: MRI OF THE RIGHT HAND WITHOUT AND WITH CONTRAST TECHNIQUE: Multiplanar, multisequence MR imaging of the right hand was performed before and after the administration of intravenous contrast. CONTRAST:  44m GADAVIST GADOBUTROL 1 MMOL/ML IV SOLN COMPARISON:  CT 05/07/2022, radiograph 05/07/2022 FINDINGS: Bones/Joint/Cartilage There is no evidence of acute  fracture. There is mild to moderate osteoarthritis throughout the hand and wrist with associated mild subchondral marrow edema and cystic change most prominent at the base of thumb and thumb MCP joint. There is no significant marrow signal alteration suggesting osteomyelitis. There is a reactive DRUJ effusion. Ligaments Intact second through fifth MCP collateral ligaments. Suboptimal positioning to accurately evaluate the thumb collateral ligaments. Muscles and Tendons No acute tendon tear or significant tenosynovitis. There is mild reactive intramuscular edema along the radial aspect of the hand. No intramuscular fluid collection. Soft tissues There is extensive soft tissue swelling and enhancement most prominent along the radial aspect of the hand and wrist. There is a subcutaneous fluid collection with surrounding soft tissue enhancement measuring 2.0 x 0.7 cm in the axial dimension and 5.5 cm in length extending along the dorsolateral aspect of the first metacarpal to the proximal phalangeal base of the thumb (post-contrast axial image 12, coronal image 14). Focal area of soft tissue non enhancement lateral to the MCP joint measuring 1.9 x 0.5 x 1.5 cm (series 12, image 17, series 11, image 12). There is a blister-like skin lesion along the dorsal ulnar wrist measuring 0.9 cm (series 12, image 4). IMPRESSION: Extensive soft tissue cellulitis of the hand and wrist, most prominent along the  radial aspect. Underlying subcutaneous soft tissue abscess measuring 2.0 x 0.7 cm in the axial dimension and 5.5 cm in length, which extends along the dorsolateral aspect of the first metacarpal to the proximal phalangeal base of the thumb. Adjacent focal area of soft tissue non enhancement lateral to the MCP joint measuring 1.9 x 0.5 x 1.5 cm which could reflect devitalized soft tissue. No evidence of osteomyelitis or infectious tenosynovitis. Reactive intramuscular edema or myositis along the radial hand without intramuscular  fluid collection or deep space abscess in the hand. Electronically Signed   By: Maurine Simmering M.D.   On: 05/09/2022 08:07   CT HAND RIGHT WO CONTRAST  Result Date: 05/07/2022 CLINICAL DATA:  Pain and swelling. Concern for abscess. Possible injury at work 2 days ago. EXAM: CT OF THE RIGHT HAND WITHOUT CONTRAST CT OF THE RIGHT FOREARM WITHOUT CONTRAST TECHNIQUE: Multidetector CT imaging of the right hand was performed according to the standard protocol. Multiplanar CT image reconstructions were also generated. RADIATION DOSE REDUCTION: This exam was performed according to the departmental dose-optimization program which includes automated exposure control, adjustment of the mA and/or kV according to patient size and/or use of iterative reconstruction technique. COMPARISON:  None Available. FINDINGS: Bones/Joint/Cartilage No fracture. No bone lesion. No bone resorption to suggest osteomyelitis. Elbow and wrist joints are normally spaced and aligned. No joint effusion. Ligaments Suboptimally assessed by CT. Muscles and Tendons Muscles unremarkable.  Tendons intact. Soft tissues Diffuse increased attenuation in the subcutaneous and deeper fat planes consistent with edema, possible cellulitis. No fluid collection to suggest an abscess. No soft tissue air. No radiopaque foreign body. IMPRESSION: 1. Diffuse forearm to wrist edema and/or cellulitis, but no evidence of an abscess. No soft tissue air to suggest fasciitis. 2. No fracture or bone lesion. No evidence of osteomyelitis. No joint effusion to suggest septic arthropathy. Electronically Signed   By: Lajean Manes M.D.   On: 05/07/2022 19:48   CT FOREARM RIGHT WO CONTRAST  Result Date: 05/07/2022 CLINICAL DATA:  Pain and swelling. Concern for abscess. Possible injury at work 2 days ago. EXAM: CT OF THE RIGHT HAND WITHOUT CONTRAST CT OF THE RIGHT FOREARM WITHOUT CONTRAST TECHNIQUE: Multidetector CT imaging of the right hand was performed according to the standard  protocol. Multiplanar CT image reconstructions were also generated. RADIATION DOSE REDUCTION: This exam was performed according to the departmental dose-optimization program which includes automated exposure control, adjustment of the mA and/or kV according to patient size and/or use of iterative reconstruction technique. COMPARISON:  None Available. FINDINGS: Bones/Joint/Cartilage No fracture. No bone lesion. No bone resorption to suggest osteomyelitis. Elbow and wrist joints are normally spaced and aligned. No joint effusion. Ligaments Suboptimally assessed by CT. Muscles and Tendons Muscles unremarkable.  Tendons intact. Soft tissues Diffuse increased attenuation in the subcutaneous and deeper fat planes consistent with edema, possible cellulitis. No fluid collection to suggest an abscess. No soft tissue air. No radiopaque foreign body. IMPRESSION: 1. Diffuse forearm to wrist edema and/or cellulitis, but no evidence of an abscess. No soft tissue air to suggest fasciitis. 2. No fracture or bone lesion. No evidence of osteomyelitis. No joint effusion to suggest septic arthropathy. Electronically Signed   By: Lajean Manes M.D.   On: 05/07/2022 19:48   DG Hand 2 View Right  Result Date: 05/07/2022 CLINICAL DATA:  Concern for foreign body at first MCP joint. EXAM: RIGHT HAND - 2 VIEW COMPARISON:  Right hand radiograph Aug 19 2017 FINDINGS: There is no evidence of  fracture or dislocation. There is no evidence of arthropathy or other focal bone abnormality. There is a 2 mm superficial faint radiodensity within the soft tissues overlying the first MCP joint. There is also significant surrounding soft tissue edema. IMPRESSION: 1. No acute fracture or dislocation. 2. There is a 2 mm superficial faint radiodensity within the soft tissues overlying the first MCP joint, with significant surrounding edema, concerning for a foreign body. Electronically Signed   By: Beryle Flock M.D.   On: 05/07/2022 17:44      Subjective: Patient was seen and examined at bedside.  Overnight events noted.   Patient still reports having pain in the right hand.  Patient is being discharged to [facility] for hand surgery.  Discharge Exam: Vitals:   05/08/22 2347 05/09/22 0743  BP: 112/69 125/75  Pulse: 74 76  Resp: 16 16  Temp: 98.7 F (37.1 C) 98.6 F (37 C)  SpO2: 97% 97%   Vitals:   05/08/22 1013 05/08/22 1240 05/08/22 2347 05/09/22 0743  BP: 113/74 128/80 112/69 125/75  Pulse: 76 65 74 76  Resp: 16 15 16 16  $ Temp:  98.3 F (36.8 C) 98.7 F (37.1 C) 98.6 F (37 C)  TempSrc:   Oral   SpO2: 97% 98% 97% 97%  Weight:      Height:        General: Pt is alert, awake, not in acute distress Cardiovascular: RRR, S1/S2 +, no rubs, no gallops Respiratory: CTA bilaterally, no wheezing, no rhonchi Abdominal: Soft, NT, ND, bowel sounds + Extremities: Right hand swollen, erythematous, tender, swollen    The results of significant diagnostics from this hospitalization (including imaging, microbiology, ancillary and laboratory) are listed below for reference.     Microbiology: Recent Results (from the past 240 hour(s))  Blood Culture (routine x 2)     Status: None (Preliminary result)   Collection Time: 05/07/22  7:13 PM   Specimen: BLOOD  Result Value Ref Range Status   Specimen Description BLOOD LEFT ANTECUBITAL  Final   Special Requests   Final    BOTTLES DRAWN AEROBIC AND ANAEROBIC Blood Culture results may not be optimal due to an inadequate volume of blood received in culture bottles   Culture   Final    NO GROWTH 2 DAYS Performed at [facility], [address], [place], [place] [address]    Report Status PENDING  Incomplete  Blood Culture (routine x 2)     Status: None (Preliminary result)   Collection Time: 05/07/22  8:22 PM   Specimen: BLOOD  Result Value Ref Range Status   Specimen Description BLOOD HAND  Final   Special Requests   Final    BOTTLES DRAWN AEROBIC AND  ANAEROBIC Blood Culture adequate volume   Culture   Final    NO GROWTH 2 DAYS Performed at [facility], [address], [place], [place] [address]    Report Status PENDING  Incomplete     Labs: BNP (last 3 results) No results for input(s): "BNP" in the last 8760 hours. Basic Metabolic Panel: Recent Labs  Lab 05/07/22 1823 05/07/22 1832 05/08/22 0411 05/09/22 0326  NA 134*  --  134*  --   K 3.0*  --  3.8  --   CL 97*  --  103  --   CO2 25  --  23  --   GLUCOSE 101*  --  105*  --   BUN 31*  --  27*  --  CREATININE 3.31*  --  2.77* 2.50*  CALCIUM 8.7*  --  7.9*  --   MG  --  1.9  --   --    Liver Function Tests: Recent Labs  Lab 05/07/22 1823  AST 22  ALT 20  ALKPHOS 86  BILITOT 1.6*  PROT 8.1  ALBUMIN 3.7   No results for input(s): "LIPASE", "AMYLASE" in the last 168 hours. No results for input(s): "AMMONIA" in the last 168 hours. CBC: Recent Labs  Lab 05/07/22 1823 05/08/22 0411  WBC 15.7* 15.3*  NEUTROABS 14.2*  --   HGB 13.2 11.3*  HCT 40.1 35.3*  MCV 83.2 86.3  PLT 165 144*   Cardiac Enzymes: No results for input(s): "CKTOTAL", "CKMB", "CKMBINDEX", "TROPONINI" in the last 168 hours. BNP: Invalid input(s): "POCBNP" CBG: No results for input(s): "GLUCAP" in the last 168 hours. D-Dimer No results for input(s): "DDIMER" in the last 72 hours. Hgb A1c No results for input(s): "HGBA1C" in the last 72 hours. Lipid Profile No results for input(s): "CHOL", "HDL", "LDLCALC", "TRIG", "CHOLHDL", "LDLDIRECT" in the last 72 hours. Thyroid function studies No results for input(s): "TSH", "T4TOTAL", "T3FREE", "THYROIDAB" in the last 72 hours.  Invalid input(s): "FREET3" Anemia work up No results for input(s): "VITAMINB12", "FOLATE", "FERRITIN", "TIBC", "IRON", "RETICCTPCT" in the last 72 hours. Urinalysis    Component Value Date/Time   COLORURINE COLORLESS (A) 11/06/2020 2308   APPEARANCEUR CLEAR (A) 11/06/2020 2308   LABSPEC 1.003 (L)  11/06/2020 2308   PHURINE 7.0 11/06/2020 2308   GLUCOSEU NEGATIVE 11/06/2020 2308   HGBUR NEGATIVE 11/06/2020 2308   BILIRUBINUR NEGATIVE 11/06/2020 2308   KETONESUR NEGATIVE 11/06/2020 2308   PROTEINUR NEGATIVE 11/06/2020 2308   NITRITE NEGATIVE 11/06/2020 2308   LEUKOCYTESUR NEGATIVE 11/06/2020 2308   Sepsis Labs Recent Labs  Lab 05/07/22 1823 05/08/22 0411  WBC 15.7* 15.3*   Microbiology Recent Results (from the past 240 hour(s))  Blood Culture (routine x 2)     Status: None (Preliminary result)   Collection Time: 05/07/22  7:13 PM   Specimen: BLOOD  Result Value Ref Range Status   Specimen Description BLOOD LEFT ANTECUBITAL  Final   Special Requests   Final    BOTTLES DRAWN AEROBIC AND ANAEROBIC Blood Culture results may not be optimal due to an inadequate volume of blood received in culture bottles   Culture   Final    NO GROWTH 2 DAYS Performed at [facility], [address], [place], [place] [address]    Report Status PENDING  Incomplete  Blood Culture (routine x 2)     Status: None (Preliminary result)   Collection Time: 05/07/22  8:22 PM   Specimen: BLOOD  Result Value Ref Range Status   Specimen Description BLOOD HAND  Final   Special Requests   Final    BOTTLES DRAWN AEROBIC AND ANAEROBIC Blood Culture adequate volume   Culture   Final    NO GROWTH 2 DAYS Performed at [facility], [address], [place], [place] [address]    Report Status PENDING  Incomplete     Time coordinating discharge: Over 30 minutes  SIGNED:   Shawna Clamp, MD  [facility] 05/09/2022, 1:11 PM Pager   If 7PM-7AM, please contact night-coverage

## 2022-05-09 NOTE — Op Note (Signed)
NAME: John Gregory MEDICAL RECORD NO: [id number] DATE OF BIRTH: [birth year] FACILITY: [facility] LOCATION: [facility] OR PHYSICIAN: Ellard Artis MD   OPERATIVE REPORT   DATE OF PROCEDURE: 05/09/22    PREOPERATIVE DIAGNOSIS:  Right hand cellulitis and abscess   POSTOPERATIVE DIAGNOSIS:  Same   PROCEDURE:  incisional debridement of right hand   SURGEON: Grafton Folk. Valla Pacey, M.D.   ASSISTANT: none   ANESTHESIA:  General   INTRAVENOUS FLUIDS:  Per anesthesia flow sheet.   ESTIMATED BLOOD LOSS:  Minimal.   COMPLICATIONS:  None.   SPECIMENS:  Cultures   TOURNIQUET TIME:    Total Tourniquet Time Documented: Upper Arm (Right) - 28 minutes Total: Upper Arm (Right) - 28 minutes    DISPOSITION:  Stable to PACU.   INDICATIONS:  57 year old male with cellulitis of right hand and sepsis found to have an abscess along the right hand on MRI.  OPERATIVE COURSE:  Patient was identified in holding and site, side, and surgery were confirmed.  Patient was brought to the operating room and transferred to the OR table with his right arm outstretched on a hand table.  A tourniquet was placed on the upper arm bony prominences were well-padded and anesthesia was induced by the anesthesia team.  The right upper extremity was prepped with DuraPrep and draped in a typical sterile fashion.  A timeout was performed.  The right arm was elevated to allow for gravity exsanguination and the tourniquet was inflated to 250 mmHg.  A midline incision 3cm in length over the dorsal aspect of the thumb metacarpal that extended distally over the dorsal MCP joint and 1 cm along the thumb proximal phalanx.  Full-thickness flaps were raised and the soft tissue was dissected down with tenotomy's and pickups until the abscess was encountered dorsally.  Cultures were then taken.  The wound was then sharply debrided with pickups and tenotomy's and then irrigated with 3 L of normal saline.  Hemostasis was  achieved with bipolar electrocautery.  A 3-0 nylon suture was placed at the proximal end and the distal end to decrease the size of the wound.  The wound was then packed with quarter inch iodoform packing and then covered with gauze, Kerlix, and an Ace bandage.  Counts were correct.  The patient was awakened and taken to the recovery room without issue.  This was an infected wound that was taken for irrigation and debridement and may need to be taken back to the OR for repeat irrigation in the future which would not constitute a complication but rather continuing treatment for the initial condition that he was taken to surgery for in the first place.    POST-OP PLAN: Patient will remain admitted on broad spectrum IV antibiotics per ID/primary team until cultures return. The surgical dressing should remain in place for the next 48 hours.  Should there be any shadowing the dressing should be reinforced.  After 48 hours the dressing as well as the iodoform packing can be removed.  The patient will then need to start antibacterial soap soaks for 15 minutes once a day with the packing removed prior to the soak and the wound will then need to be repacked with fresh iodoform.  Care should be taken not to pack the wound too tightly.   Ellard Artis, MD Electronically signed, 05/09/22

## 2022-05-09 NOTE — Plan of Care (Signed)
  Problem: Clinical Measurements: Goal: Ability to maintain clinical measurements within normal limits will improve Outcome: Progressing Goal: Will remain free from infection Outcome: Progressing   Problem: Pain Managment: Goal: General experience of comfort will improve Outcome: Progressing   Problem: Safety: Goal: Ability to remain free from injury will improve Outcome: Progressing   Problem: Skin Integrity: Goal: Risk for impaired skin integrity will decrease Outcome: Progressing   

## 2022-05-10 ENCOUNTER — Encounter (HOSPITAL_COMMUNITY): Payer: Self-pay | Admitting: Orthopedic Surgery

## 2022-05-10 MED ORDER — POLYETHYLENE GLYCOL 3350 17 G PO PACK
17.0000 g | PACK | Freq: Two times a day (BID) | ORAL | Status: DC
  Administered 2022-05-10 – 2022-05-13 (×5): 17 g via ORAL
  Filled 2022-05-10 (×10): qty 1

## 2022-05-10 NOTE — Progress Notes (Signed)
PROGRESS NOTE    John Gregory  [id number] DOB: 1965-10-27 DOA: 05/09/2022 PCP: Letta Median, MD   Brief Narrative: 57 year old with past medical history significant for hypertension, CKD stage IIIb, osteoarthritis, dyslipidemia, or urolithiasis, polycystic kidney disease presents to the ED with acute onset of right hand pain worsening 3 days prior to admission, developed swelling erythema tenderness, John Gregory does not recall any injury.  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  John Gregory was admitted at [facility] and started on IV antibiotics with no improvement. MRI show abscess formation, tenosynovitis.  Transferred to [facility] for hand surgery evaluation.     Assessment & Plan:   Principal Problem:   Hand abscess Active Problems:   Sepsis due to cellulitis ([place])   Cellulitis of multiple sites of right hand and fingers   Acute kidney injury superimposed on chronic kidney disease (HCC)   HLD (hyperlipidemia)   Chronic kidney disease (CKD), stage IIIb   Essential hypertension   Dyslipidemia   1-Sepsis POA due to right hand infection. Right hand cellulitis and abscess: -MRI hand: Extensive soft tissue cellulitis of the hand and wrist, most prominent along the radial aspect. Underlying subcutaneous soft tissue abscess measuring 2.0 x 0.7 cm in the axial dimension and 5.5 cm in length, which extends along the dorsolateral aspect of the first metacarpal to the proximal phalangeal base of the thumb. Adjacent focal area of soft tissue non enhancement lateral to the MCP joint measuring 1.9 x 0.5 x 1.5 cm which could reflect devitalized soft tissue. No evidence of osteomyelitis or infectious tenosynovitis. -Underwent to the OR for incision and debridement of the right hand  by Dr. Ala Bent.  -Continue with surgical dressing in place for next 48 hours postsurgery.  Patient will need to start antibacterial soap  soaks for 15 minutes once a day with packing removed prior to the soak wound will need to be repacked with fresh iodoform.  -Defer post operative care of wound to hand surgeon.  -ID following.  -On IV cefepime, Flagyl and linezolid -Blood cell trending down -Follow cultures from wound  AKI on CKD stage IIIb Correction  patient have CKD stage IIIb note 3 A Metabolic acidosis, monitor.  Prior creatinine range 2.2--2.5 GFR 33 Presented with a creatinine of 2.7 Creatinine trending down today at 2.1  Hyperlipidemia: Continue with Lipitor  Hypertension: Continue  with Norvasc Continue to hold hydrochlorothiazide-benazepril  to avoid  AKI   Estimated body mass index is 20.81 kg/m as calculated from the following:   Height as of 05/07/22: 5' 10"$  (1.778 m).   Weight as of 05/07/22: 65.8 kg.   DVT prophylaxis: heparin  Code Status: Full code Family Communication: Care discussed with patient.  Disposition Plan:  Status is: Inpatient Remains inpatient appropriate because: management of hand infection    Consultants:  Hand Surgeon: Dr. Ala Bent.   Procedures:  2/13: Underwent to the OR for incision and debridement of the right  hand  Antimicrobials:    Subjective: John Gregory is alert, report pain right hand. Dressing in placed  Objective: Vitals:   05/09/22 2315 05/09/22 2327 05/10/22 0327 05/10/22 0733  BP: 131/86 123/89 (!) 121/90 (!) 133/98  Pulse: 68 69 77 (!) 56  Resp: 11 18 16 17  $ Temp: 98.9 F (37.2 C) 97.8 F (36.6 C) 98.2 F (36.8 C) 98.4 F (36.9 C)  TempSrc:  Oral Oral Oral  SpO2: 94% 92% 98% 99%  Intake/Output Summary (Last 24 hours) at 05/10/2022 0818 Last data filed at 05/10/2022 0440 Gross per 24 hour  Intake 200 ml  Output 400 ml  Net -200 ml   There were no vitals filed for this visit.  Examination:  General exam: Appears calm and comfortable  Respiratory system: Clear to auscultation. Respiratory effort normal. Cardiovascular system: S1 & S2  heard, RRR.  Gastrointestinal system: Abdomen is nondistended, soft and nontender. No organomegaly or masses felt. Normal bowel sounds heard. Central nervous system: Alert and oriented. No focal neurological deficits. Extremities: Symmetric 5 x 5 power. Skin: Right hand with clean dressing    Data Reviewed: I have personally reviewed following labs and imaging studies  CBC: Recent Labs  Lab 05/07/22 1823 05/08/22 0411 05/09/22 1832  WBC 15.7* 15.3* 13.7*  NEUTROABS 14.2*  --   --   HGB 13.2 11.3* 11.4*  HCT 40.1 35.3* 34.3*  MCV 83.2 86.3 83.5  PLT 165 144* [id number]   Basic Metabolic Panel: Recent Labs  Lab 05/07/22 1823 05/07/22 1832 05/08/22 0411 05/09/22 0326 05/09/22 1832  NA 134*  --  134*  --  137  K 3.0*  --  3.8  --  4.2  CL 97*  --  103  --  110  CO2 25  --  23  --  18*  GLUCOSE 101*  --  105*  --  85  BUN 31*  --  27*  --  16  CREATININE 3.31*  --  2.77* 2.50* 2.16*  CALCIUM 8.7*  --  7.9*  --  8.2*  MG  --  1.9  --   --   --    GFR: Estimated Creatinine Clearance: 36.4 mL/min (A) (by C-G formula based on SCr of 2.16 mg/dL (H)). Liver Function Tests: Recent Labs  Lab 05/07/22 1823  AST 22  ALT 20  ALKPHOS 86  BILITOT 1.6*  PROT 8.1  ALBUMIN 3.7   No results for input(s): "LIPASE", "AMYLASE" in the last 168 hours. No results for input(s): "AMMONIA" in the last 168 hours. Coagulation Profile: Recent Labs  Lab 05/07/22 1913 05/08/22 0411  INR 1.2 1.3*   Cardiac Enzymes: No results for input(s): "CKTOTAL", "CKMB", "CKMBINDEX", "TROPONINI" in the last 168 hours. BNP (last 3 results) No results for input(s): "PROBNP" in the last 8760 hours. HbA1C: No results for input(s): "HGBA1C" in the last 72 hours. CBG: No results for input(s): "GLUCAP" in the last 168 hours. Lipid Profile: No results for input(s): "CHOL", "HDL", "LDLCALC", "TRIG", "CHOLHDL", "LDLDIRECT" in the last 72 hours. Thyroid Function Tests: No results for input(s): "TSH", "T4TOTAL",  "FREET4", "T3FREE", "THYROIDAB" in the last 72 hours. Anemia Panel: No results for input(s): "VITAMINB12", "FOLATE", "FERRITIN", "TIBC", "IRON", "RETICCTPCT" in the last 72 hours. Sepsis Labs: Recent Labs  Lab 05/07/22 1823 05/07/22 2042 05/08/22 0411  PROCALCITON  --   --  3.39  LATICACIDVEN 1.6 1.0  --     Recent Results (from the past 240 hour(s))  Blood Culture (routine x 2)     Status: None (Preliminary result)   Collection Time: 05/07/22  7:13 PM   Specimen: BLOOD  Result Value Ref Range Status   Specimen Description BLOOD LEFT ANTECUBITAL  Final   Special Requests   Final    BOTTLES DRAWN AEROBIC AND ANAEROBIC Blood Culture results may not be optimal due to an inadequate volume of blood received in culture bottles   Culture   Final    NO GROWTH 3 DAYS Performed  at [facility], [address] [place], [place] [address]    Report Status PENDING  Incomplete  Blood Culture (routine x 2)     Status: None (Preliminary result)   Collection Time: 05/07/22  8:22 PM   Specimen: BLOOD  Result Value Ref Range Status   Specimen Description BLOOD HAND  Final   Special Requests   Final    BOTTLES DRAWN AEROBIC AND ANAEROBIC Blood Culture adequate volume   Culture   Final    NO GROWTH 3 DAYS Performed at [facility], [address], [place], [place] [address]    Report Status PENDING  Incomplete  Aerobic/Anaerobic Culture w Gram Stain (surgical/deep wound)     Status: None (Preliminary result)   Collection Time: 05/09/22  9:43 PM   Specimen: Hand, Right; Abscess  Result Value Ref Range Status   Specimen Description ABSCESS  Final   Special Requests RH  Final   Gram Stain   Final    RARE WBC PRESENT, PREDOMINANTLY PMN NO ORGANISMS SEEN Performed at [facility], [place] [address], [place],  [address]    Culture PENDING  Incomplete   Report Status PENDING  Incomplete         Radiology Studies: MR HAND RIGHT W WO CONTRAST  Result Date:  05/09/2022 CLINICAL DATA:  Hand cellulitis EXAM: MRI OF THE RIGHT HAND WITHOUT AND WITH CONTRAST TECHNIQUE: Multiplanar, multisequence MR imaging of the right hand was performed before and after the administration of intravenous contrast. CONTRAST:  94m GADAVIST GADOBUTROL 1 MMOL/ML IV SOLN COMPARISON:  CT 05/07/2022, radiograph 05/07/2022 FINDINGS: Bones/Joint/Cartilage There is no evidence of acute fracture. There is mild to moderate osteoarthritis throughout the hand and wrist with associated mild subchondral marrow edema and cystic change most prominent at the base of thumb and thumb MCP joint. There is no significant marrow signal alteration suggesting osteomyelitis. There is a reactive DRUJ effusion. Ligaments Intact second through fifth MCP collateral ligaments. Suboptimal positioning to accurately evaluate the thumb collateral ligaments. Muscles and Tendons No acute tendon tear or significant tenosynovitis. There is mild reactive intramuscular edema along the radial aspect of the hand. No intramuscular fluid collection. Soft tissues There is extensive soft tissue swelling and enhancement most prominent along the radial aspect of the hand and wrist. There is a subcutaneous fluid collection with surrounding soft tissue enhancement measuring 2.0 x 0.7 cm in the axial dimension and 5.5 cm in length extending along the dorsolateral aspect of the first metacarpal to the proximal phalangeal base of the thumb (post-contrast axial image 12, coronal image 14). Focal area of soft tissue non enhancement lateral to the MCP joint measuring 1.9 x 0.5 x 1.5 cm (series 12, image 17, series 11, image 12). There is a blister-like skin lesion along the dorsal ulnar wrist measuring 0.9 cm (series 12, image 4). IMPRESSION: Extensive soft tissue cellulitis of the hand and wrist, most prominent along the radial aspect. Underlying subcutaneous soft tissue abscess measuring 2.0 x 0.7 cm in the axial dimension and 5.5 cm in length,  which extends along the dorsolateral aspect of the first metacarpal to the proximal phalangeal base of the thumb. Adjacent focal area of soft tissue non enhancement lateral to the MCP joint measuring 1.9 x 0.5 x 1.5 cm which could reflect devitalized soft tissue. No evidence of osteomyelitis or infectious tenosynovitis. Reactive intramuscular edema or myositis along the radial hand without intramuscular fluid collection or deep space abscess in the hand. Electronically Signed  By: Maurine Simmering M.D.   On: 05/09/2022 08:07        Scheduled Meds:  amLODipine  10 mg Oral Daily   atorvastatin  40 mg Oral q1800   heparin  5,000 Units Subcutaneous Q8H   lidocaine  20 mL Other Once   Continuous Infusions:  sodium chloride 75 mL/hr at 05/09/22 1814   ceFEPime (MAXIPIME) IV 2 g (05/09/22 1815)   linezolid (ZYVOX) IV     metronidazole 500 mg (05/10/22 0536)     LOS: 1 day    Time spent: 35 minutes    Yamir Carignan A Malaiya Paczkowski, MD [facility]   If 7PM-7AM, please contact night-coverage [email or web address]  05/10/2022, 8:18 AM

## 2022-05-10 NOTE — Progress Notes (Incomplete)
   [facility] Infectious Diseases Follow Up Note  Patient Identification: Patient Name: John Gregory MRN: [id number] [place] Date: 05/09/2022  3:38 PM Age: 57 y.o.Today's Date: 05/10/2022   Reason for Visit:  Principal Problem:   Hand abscess Active Problems:   HLD (hyperlipidemia)   Chronic kidney disease (CKD), stage IIIb   Sepsis due to cellulitis [facility])   Essential hypertension   Dyslipidemia   Cellulitis of multiple sites of right hand and fingers   Acute kidney injury superimposed on chronic kidney disease (HCC)   Antibiotics:   Lines/Hardwares:   Interval Events:   Assessment    Recommendations   Plan discussed with patient/family/ID pharmacy and Primary  Rest of the management as per the primary team. Thank you for the consult. Please page with pertinent questions or concerns.  ______________________________________________________________________ Subjective patient seen and examined at the bedside. ***     Vitals VS:  T -      HR -       RR -       O2 -        BP -     Physical Exam Constitutional:      Comments:   Cardiovascular:     Rate and Rhythm: Normal rate and regular rhythm.     Heart sounds: No murmur heard.   Pulmonary:     Effort: Pulmonary effort is normal.     Comments:   Abdominal:     Palpations: Abdomen is soft.     Tenderness:   Musculoskeletal:        General: No swelling or tenderness.   Skin:    Comments:   Neurological:     General: No focal deficit present.   Psychiatric:        Mood and Affect: Mood normal.     Pertinent Microbiology -   Pertinent Lab.   Pertinent Imaging today Plain films and CT images have been personally visualized and interpreted; radiology reports have been reviewed. Decision making incorporated into the Impression / Recommendations.  I spent more than 35 minutes for this patient encounter including review of prior medical  records, coordination of care with primary/other specialist with greater than 50% of time being face to face/counseling and discussing diagnostics/treatment plan with the patient/family.  Electronically signed by:   [name], MD Infectious Disease Physician [facility] for Infectious Disease Pager: [phone]

## 2022-05-10 NOTE — Anesthesia Postprocedure Evaluation (Signed)
Anesthesia Post Note  Patient: John Gregory  Procedure(s) Performed: IRRIGATION AND DEBRIDEMENT RIGHT HAND (Right: Hand)     Patient location during evaluation: PACU Anesthesia Type: General Level of consciousness: awake and alert Pain management: pain level controlled Vital Signs Assessment: post-procedure vital signs reviewed and stable Respiratory status: spontaneous breathing, nonlabored ventilation and respiratory function stable Cardiovascular status: blood pressure returned to baseline and stable Postop Assessment: no apparent nausea or vomiting Anesthetic complications: no   No notable events documented.  Last Vitals:  Vitals:   05/10/22 0327 05/10/22 0733  BP: (!) 121/90 (!) 133/98  Pulse: 77 (!) 56  Resp: 16 17  Temp: 36.8 C 36.9 C  SpO2: 98% 99%    Last Pain:  Vitals:   05/10/22 0733  TempSrc: Oral  PainSc:                  Danissa Rundle

## 2022-05-10 NOTE — Plan of Care (Signed)

## 2022-05-11 ENCOUNTER — Inpatient Hospital Stay (HOSPITAL_COMMUNITY): Payer: Commercial Managed Care - PPO

## 2022-05-11 ENCOUNTER — Other Ambulatory Visit: Payer: Self-pay

## 2022-05-11 DIAGNOSIS — L02519 Cutaneous abscess of unspecified hand: Secondary | ICD-10-CM

## 2022-05-11 DIAGNOSIS — L02511 Cutaneous abscess of right hand: Secondary | ICD-10-CM | POA: Diagnosis present

## 2022-05-11 LAB — BASIC METABOLIC PANEL
Anion gap: 9 (ref 5–15)
BUN: 15 mg/dL (ref 6–20)
CO2: 18 mmol/L — ABNORMAL LOW (ref 22–32)
Calcium: 8.1 mg/dL — ABNORMAL LOW (ref 8.9–10.3)
Chloride: 108 mmol/L (ref 98–111)
Creatinine, Ser: 2.17 mg/dL — ABNORMAL HIGH (ref 0.61–1.24)
GFR, Estimated: 35 mL/min — ABNORMAL LOW (ref >60)
Glucose, Bld: 92 mg/dL (ref 70–99)
Potassium: 4.4 mmol/L (ref 3.5–5.1)
Sodium: 135 mmol/L (ref 135–145)

## 2022-05-11 LAB — LACTIC ACID, PLASMA
Lactic Acid, Venous: 1 mmol/L (ref 0.5–1.9)
Lactic Acid, Venous: 0.9 mmol/L (ref 0.5–1.9)

## 2022-05-11 LAB — CBC
HCT: 32.2 % — ABNORMAL LOW (ref 39.0–52.0)
Hemoglobin: 10.6 g/dL — ABNORMAL LOW (ref 13.0–17.0)
MCH: 28.1 pg (ref 26.0–34.0)
MCHC: 32.9 g/dL (ref 30.0–36.0)
MCV: 85.4 fL (ref 80.0–100.0)
Platelets: 235 10*3/uL (ref 150–400)
RBC: 3.77 MIL/uL — ABNORMAL LOW (ref 4.22–5.81)
RDW: 13.2 % (ref 11.5–15.5)
WBC: 11.8 10*3/uL — ABNORMAL HIGH (ref 4.0–10.5)
nRBC: 0 % (ref 0.0–0.2)

## 2022-05-11 LAB — C-REACTIVE PROTEIN: CRP: 6.7 mg/dL — ABNORMAL HIGH (ref <1.0)

## 2022-05-11 LAB — SEDIMENTATION RATE: Sed Rate: 97 mm/hr — ABNORMAL HIGH (ref 0–16)

## 2022-05-11 MED ORDER — MUSCLE RUB 10-15 % EX CREA
TOPICAL_CREAM | CUTANEOUS | Status: DC | PRN
  Administered 2022-05-11: 1 via TOPICAL
  Filled 2022-05-11: qty 85

## 2022-05-11 MED ORDER — SODIUM CHLORIDE 0.9 % IV SOLN
2.0000 g | Freq: Two times a day (BID) | INTRAVENOUS | Status: DC
  Administered 2022-05-11: 2 g via INTRAVENOUS
  Filled 2022-05-11: qty 12.5

## 2022-05-11 MED ORDER — CHLORHEXIDINE GLUCONATE 4 % EX LIQD
Freq: Every day | CUTANEOUS | Status: DC
  Filled 2022-05-11 (×4): qty 15

## 2022-05-11 MED ORDER — LIP MEDEX EX OINT
TOPICAL_OINTMENT | CUTANEOUS | Status: DC | PRN
  Filled 2022-05-11: qty 7

## 2022-05-11 MED ORDER — SODIUM CHLORIDE 0.9 % IV SOLN
2.0000 g | INTRAVENOUS | Status: DC
  Administered 2022-05-11 – 2022-05-14 (×4): 2 g via INTRAVENOUS
  Filled 2022-05-11 (×4): qty 20

## 2022-05-11 NOTE — Progress Notes (Signed)
PHARMACY NOTE:  ANTIMICROBIAL RENAL DOSAGE ADJUSTMENT  Current antimicrobial regimen includes a mismatch between antimicrobial dosage and estimated renal function.  As per policy approved by the Pharmacy & Therapeutics and Medical Executive Committees, the antimicrobial dosage will be adjusted accordingly.  Current antimicrobial dosage:  Cefepime 2g IV every 24 hours  Indication: R-hand infection/abscess  Renal Function:  Estimated Creatinine Clearance: 36.2 mL/min (A) (by C-G formula based on SCr of 2.17 mg/dL (H)). []$      On intermittent HD, scheduled: []$      On CRRT    Antimicrobial dosage has been changed to:  Cefepime 2g IV every 12 hours  Additional comments:  Thank you for allowing pharmacy to be a part of this patient's care.  Alycia Rossetti, PharmD, BCPS Infectious Diseases Clinical Pharmacist 05/11/2022 8:21 AM   **Pharmacist phone directory can now be found on [email or web address] ([email or web address]).  Listed under [place].

## 2022-05-11 NOTE — Assessment & Plan Note (Signed)
The patient is normotensive on amlodipine and lotensin as at home. Monitor.

## 2022-05-11 NOTE — Hospital Course (Addendum)
57 year old with past medical history significant for hypertension, CKD stage IIIb, osteoarthritis, dyslipidemia, or urolithiasis, polycystic kidney disease presents to the ED with acute onset of right hand pain worsening 3 days prior to admission, developed swelling erythema tenderness, he does not recall any injury.  He does work in  [employer] and he does sustain some injury intermittently, does not recall any recent trauma.  He was admitted at [facility] and started on IV antibiotics with no improvement. MRI show abscess formation, tenosynovitis.  Transferred to [facility] for hand surgery evaluation.   The patient was admitted. He was placed on linezolid, cefepime, and metronidazole. The patient was evaluated by hand surgery and underwent I & D of abscess along the first metacarpal on 05/09/2022. Gram stain of surgical specimens has not revealed any organisms and cultures have had no growth. Recommendation from ID is to continue current broad spectrum antibiotics for now and to narrow antibiotics when possible.   On 05/12/2022 the patient complained of right knee pain. X-ray of the joint revealed a significant joint effusion. Orthopedic surgery was consulted and the joint was tapped. Results pending.  2/17: Blood pressure mildly elevated.  Preliminary synovial fluid cultures negative.  No crystals seen.  Uric acid normal.  Hand abscess cultures with group A strep.  Patient is on ceftriaxone.  2/18: Hemodynamically stable.  ID would like him to continue with IV antibiotics for another 24 hours.  Continue to have right knee pain, synovial fluid cultures remain negative.  Crystals negative. If continue to have significant right knee pain, we will discuss with orthopedic surgery regarding getting a washout.  2/19: Hemodynamically stable.  Synovial fluid cultures remain negative.  Infectious disease switched him to Keflex to complete a total of 2 weeks course after the procedure, he will take antibiotics  until 05/23/2022.  Discussed with orthopedic surgery and they are not recommending any washout, they will follow-up with him as outpatient in 1 week. Patient need to soak his hand with antibacterial soap and change dressing daily until he sees his orthopedic surgeon for further recommendations.  He continued to have right knee pain but edema and effusion has been improved.  Per patient he has an history of arthritis before.  He was also given oxycodone to be used only as needed for severe pain.  Patient will continue the rest of his home medications and need to have a close follow-up with his providers for further recommendations.

## 2022-05-11 NOTE — Progress Notes (Signed)
Pt is complaining of 10/10 acute right knee pain. There is mild swelling. The cause is unknown. He is anxious and convinced is blood clot, which the s/s are not consistent with DVT and he is on heparin injection. Ninetta Lights, MD on call was notified

## 2022-05-11 NOTE — Progress Notes (Signed)
S: Patient is doing well at this time.  He is postop day 2 status post I&D of the right hand.  His pain has significantly improved.  Cultures from the OR have not returned yet.  His white count has been decreasing although it is still slightly elevated at 11.8.  CRP and sed rate are pending.  O: Sensation intact.  He is able to wiggle fingers.  Dressings are clean.  A/P: Status post I&D of the right hand.  Starting today he will need daily dressing changes with placement of new iodoform packing.  At the time of dressing change he will need to perform daily antibacterial soap soak for 15 minutes.  The prior packing should be removed before the antibacterial soap soak and then the new packing should be placed after.  I recommend obtaining every 48 hour CRP and CBC labs to monitor for resolution of his infection.  Patient will need to follow-up with me in 1 week.  Wadie Lessen, MD Hand & Upper Extremity Surgery [facility] [place]

## 2022-05-11 NOTE — Assessment & Plan Note (Signed)
Continue atorvastatin as at home.

## 2022-05-11 NOTE — Assessment & Plan Note (Addendum)
S/P I & D of abscess along first metacarpal 05/09/2022. He is receiving ceftriaxone for GAS grown from operative cultures. Will continue wound care as detailed by hand surgery and follow CRP and CBC as recommended by hand surgery. When patient is appropriate for discharge he will need to follow up with hand surgery in 1 week.

## 2022-05-11 NOTE — Consult Note (Addendum)
I have seen and examined the patient. I have personally reviewed the clinical findings, laboratory findings, microbiological data and imaging studies. The assessment and treatment plan was discussed with the Nurse Practitioner Mauricio Po. I agree with her/his recommendations except following additions/corrections.  Rt hand/wrist cellulitis and abscess, with probable unnoticed trauma at work.  Denies h/o IVDU, dirty water exposure, has cats but no h/o scratches and bites.  2/13 s/p I and D with IR findings concerning for abscess. OR cx growing Group A strep.   H/o penicillin allergy noted   Labs/imaging and microbiologic data reviewed   Will switch IV abtx to ceftriaxone and DC cefepime and metronidazole  Complete 72 hrs of Linezolid for anti-toxin effect Post operative care per hand surgery  Following   [name], MD Infectious Disease Physician [facility] [address] [place], [place] [address] Phone: [phone]  Fax: [place] [facility] Infectious Disease    Date of Admission:  05/09/2022     Total days of antibiotics 5               Reason for Consult: Hand infection   Referring Provider: Dr. Tyrell Antonio  Primary Care Provider: Letta Median, MD   ASSESSMENT:  [name] is a 57 y/o male with history of CKD Stage III presenting from work with right hand pain and swelling and found to have abscess along the first metacarpal s/p I&D on 2/13 and now POD #2 with surgical specimens showing no organisms on gram stain and cultures pending. Renal function initially elevated and appears to be down trending towards baseline around 2.  On broad spectrum coverage with linezolid, cefepime and metronidazole which he did receive prior to surgery and may limit culture yield. Has penicillin allergy which causes him to swell but has been several years. Continue with broad spectrum coverage with Zyvox,  cefepime and metronidazole for now. Can consider narrowing pending culture results if able. Therapeutic drug monitoring of platelets while on Zyvox. Awaiting plan for need for additional surgeries and continue post-operative wound care per Orthopedics with remaining medical and supportive care per Internal Medicine.   PLAN:  Continue Zyvox, cefepime and metronidazole.  Monitor cultures for organism identification. Therapeutic drug monitoring of platelets while on Zyvox.  Post-operative wound care and surgical plan per Orthopedics.  Remaining medical and supportive care per Internal Medicine.     Principal Problem:   Hand abscess Active Problems:   HLD (hyperlipidemia)   Chronic kidney disease (CKD), stage IIIb   Sepsis due to cellulitis [facility])   Essential hypertension   Dyslipidemia   Cellulitis of multiple sites of right hand and fingers   Acute kidney injury superimposed on chronic kidney disease (HCC)    amLODipine  10 mg Oral Daily   atorvastatin  40 mg Oral q1800   heparin  5,000 Units Subcutaneous Q8H   lidocaine  20 mL Other Once   polyethylene glycol  17 g Oral BID     HPI: John Gregory is a 57 y.o. male previous medical history of chronic kidney disease IIIb, urolithiasis, and osteoarthritis presenting to the ED from work with right hand pain radiating up his arm.   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 Does cut himself often at work. Has a picture of  what appears to be a puncture wound on the dorsum of the right thumb. Severity of the pain was enough to disturb his sleep and had noticed increased. Febrile with temperature of 101.7 F and leukocytosis with WBC count 15.3. Right hand x-ray with 2 mm superficial radiodensity within the soft tissues overlying the first MCP joint. CT right hand with no evidence of  osteomyelitis, fracture or bone lesion; and diffuse forearm wrist edema/cellulitis. MRI right hand with extensive soft tissue cellulitis of the hand and wrist and 2.0 x 0.7 cm abscess and no evidence of osteomyelitis or infectious tenosynovitis. Brought to the OR for I&D of the right hand with cultures taken from the abscess. Specimens are without organisms on gram stain with cultures pending. Started on Zyvox, cefepime and metronidazole prior to surgery.   John Gregory is POD #2 and has remained afebrile and is on broad spectrum coverage with linezolid, cefepime and metronidazole. Renal function improving with creatinine 2.17 (baseline ~2).   Review of Systems: Review of Systems  Constitutional:  Negative for chills, fever and weight loss.  Respiratory:  Negative for cough, shortness of breath and wheezing.   Cardiovascular:  Negative for chest pain and leg swelling.  Gastrointestinal:  Negative for abdominal pain, constipation, diarrhea, nausea and vomiting.  Skin:  Negative for rash.     Past Medical History:  Diagnosis Date   Arthritis    hands, shoulders   Chronic kidney disease (CKD), stage IIIb    HLD (hyperlipidemia)    Hypertension    Kidney stone    Kidney stone    Kidney stones    Polycystic kidney disease    Renal disorder    Wears contact lenses    Wears dentures    partial upper and lower   Past Surgical History:  Procedure Laterality Date   ANKLE SURGERY Left    EXCISION MASS NECK Left 10/31/2017   Procedure: EXCISION MASS NECK;  Surgeon: Carloyn Manner, MD;  Location: [place];  Service: ENT;  Laterality: Left;   I & D EXTREMITY Right 05/09/2022   Procedure: IRRIGATION AND DEBRIDEMENT RIGHT HAND;  Surgeon: Ellard Artis, MD;  Location: [place];  Service: Orthopedics;  Laterality: Right;   INGUINAL HERNIA REPAIR Bilateral 07/25/2019   Procedure: HERNIA REPAIR INGUINAL ADULT;  Surgeon: Fredirick Maudlin, MD;  Location: [facility];  Service: General;   Laterality: Bilateral;   KNEE SURGERY Bilateral    LEFT HEART CATH AND CORONARY ANGIOGRAPHY N/A 05/25/2021   Procedure: LEFT HEART CATH AND CORONARY ANGIOGRAPHY;  Surgeon: Nelva Bush, MD;  Location: [place] [facility];  Service: Cardiovascular;  Laterality: N/A;   SHOULDER SURGERY Left    WRIST SURGERY Left      Social History   Tobacco Use   Smoking status: Never   Smokeless tobacco: Current    Types: Snuff  Vaping Use   Vaping Use: Never used  Substance Use Topics   Alcohol use: No   Drug use: No    Family History  Problem Relation Age of Onset   Polycystic kidney disease Mother    Polycystic kidney disease Sister     Allergies  Allergen Reactions   Bactrim [Sulfamethoxazole-Trimethoprim] Itching   Calcium Other (See Comments)    Kidney stones   Penicillins Swelling    angioedema    OBJECTIVE: Blood pressure (!) 139/90, pulse (!) 51, temperature 98.4 F (36.9 C), temperature source Oral, resp. rate 17, SpO2 98 %.  Physical Exam Constitutional:  General: He is not in acute distress.    Appearance: He is well-developed.  Cardiovascular:     Rate and Rhythm: Normal rate and regular rhythm.     Heart sounds: Normal heart sounds.  Pulmonary:     Effort: Pulmonary effort is normal.     Breath sounds: Normal breath sounds.  Skin:    General: Skin is warm and dry.  Neurological:     Mental Status: He is alert and oriented to person, place, and time.  Psychiatric:        Behavior: Behavior normal.        Thought Content: Thought content normal.        Judgment: Judgment normal.     Lab Results Lab Results  Component Value Date   WBC 11.8 (H) 05/11/2022   HGB 10.6 (L) 05/11/2022   HCT 32.2 (L) 05/11/2022   MCV 85.4 05/11/2022   PLT 235 05/11/2022    Lab Results  Component Value Date   CREATININE 2.17 (H) 05/11/2022   BUN 15 05/11/2022   NA 135 05/11/2022   K 4.4 05/11/2022   CL 108 05/11/2022   CO2 18 (L) 05/11/2022    Lab Results   Component Value Date   ALT 20 05/07/2022   AST 22 05/07/2022   ALKPHOS 86 05/07/2022   BILITOT 1.6 (H) 05/07/2022     Microbiology: Recent Results (from the past 240 hour(s))  Blood Culture (routine x 2)     Status: None (Preliminary result)   Collection Time: 05/07/22  7:13 PM   Specimen: BLOOD  Result Value Ref Range Status   Specimen Description BLOOD LEFT ANTECUBITAL  Final   Special Requests   Final    BOTTLES DRAWN AEROBIC AND ANAEROBIC Blood Culture results may not be optimal due to an inadequate volume of blood received in culture bottles   Culture   Final    NO GROWTH 4 DAYS Performed at [facility], [address] [place], [place] [address]    Report Status PENDING  Incomplete  Blood Culture (routine x 2)     Status: None (Preliminary result)   Collection Time: 05/07/22  8:22 PM   Specimen: BLOOD  Result Value Ref Range Status   Specimen Description BLOOD HAND  Final   Special Requests   Final    BOTTLES DRAWN AEROBIC AND ANAEROBIC Blood Culture adequate volume   Culture   Final    NO GROWTH 4 DAYS Performed at [facility], [address] [place], [place] [address]    Report Status PENDING  Incomplete  Aerobic/Anaerobic Culture w Gram Stain (surgical/deep wound)     Status: None (Preliminary result)   Collection Time: 05/09/22  9:43 PM   Specimen: Hand, Right; Abscess  Result Value Ref Range Status   Specimen Description ABSCESS  Final   Special Requests RH  Final   Gram Stain   Final    RARE WBC PRESENT, PREDOMINANTLY PMN NO ORGANISMS SEEN Performed at [facility], [place] [address] [place],  [address]    Culture PENDING  Incomplete   Report Status PENDING  Incomplete   Imaging MR HAND RIGHT W WO CONTRAST  Result Date: 05/09/2022 CLINICAL DATA:  Hand cellulitis EXAM: MRI OF THE RIGHT HAND WITHOUT AND WITH CONTRAST TECHNIQUE: Multiplanar, multisequence MR imaging of the right hand was performed before and after the  administration of intravenous contrast. CONTRAST:  25m GADAVIST GADOBUTROL 1 MMOL/ML IV SOLN COMPARISON:  CT 05/07/2022, radiograph  05/07/2022 FINDINGS: Bones/Joint/Cartilage There is no evidence of acute fracture. There is mild to moderate osteoarthritis throughout the hand and wrist with associated mild subchondral marrow edema and cystic change most prominent at the base of thumb and thumb MCP joint. There is no significant marrow signal alteration suggesting osteomyelitis. There is a reactive DRUJ effusion. Ligaments Intact second through fifth MCP collateral ligaments. Suboptimal positioning to accurately evaluate the thumb collateral ligaments. Muscles and Tendons No acute tendon tear or significant tenosynovitis. There is mild reactive intramuscular edema along the radial aspect of the hand. No intramuscular fluid collection. Soft tissues There is extensive soft tissue swelling and enhancement most prominent along the radial aspect of the hand and wrist. There is a subcutaneous fluid collection with surrounding soft tissue enhancement measuring 2.0 x 0.7 cm in the axial dimension and 5.5 cm in length extending along the dorsolateral aspect of the first metacarpal to the proximal phalangeal base of the thumb (post-contrast axial image 12, coronal image 14). Focal area of soft tissue non enhancement lateral to the MCP joint measuring 1.9 x 0.5 x 1.5 cm (series 12, image 17, series 11, image 12). There is a blister-like skin lesion along the dorsal ulnar wrist measuring 0.9 cm (series 12, image 4). IMPRESSION: Extensive soft tissue cellulitis of the hand and wrist, most prominent along the radial aspect. Underlying subcutaneous soft tissue abscess measuring 2.0 x 0.7 cm in the axial dimension and 5.5 cm in length, which extends along the dorsolateral aspect of the first metacarpal to the proximal phalangeal base of the thumb. Adjacent focal area of soft tissue non enhancement lateral to the MCP joint measuring  1.9 x 0.5 x 1.5 cm which could reflect devitalized soft tissue. No evidence of osteomyelitis or infectious tenosynovitis. Reactive intramuscular edema or myositis along the radial hand without intramuscular fluid collection or deep space abscess in the hand. Electronically Signed   By: Maurine Simmering M.D.   On: 05/09/2022 08:07   CT HAND RIGHT WO CONTRAST  Result Date: 05/07/2022 CLINICAL DATA:  Pain and swelling. Concern for abscess. Possible injury at work 2 days ago. EXAM: CT OF THE RIGHT HAND WITHOUT CONTRAST CT OF THE RIGHT FOREARM WITHOUT CONTRAST TECHNIQUE: Multidetector CT imaging of the right hand was performed according to the standard protocol. Multiplanar CT image reconstructions were also generated. RADIATION DOSE REDUCTION: This exam was performed according to the departmental dose-optimization program which includes automated exposure control, adjustment of the mA and/or kV according to patient size and/or use of iterative reconstruction technique. COMPARISON:  None Available. FINDINGS: Bones/Joint/Cartilage No fracture. No bone lesion. No bone resorption to suggest osteomyelitis. Elbow and wrist joints are normally spaced and aligned. No joint effusion. Ligaments Suboptimally assessed by CT. Muscles and Tendons Muscles unremarkable.  Tendons intact. Soft tissues Diffuse increased attenuation in the subcutaneous and deeper fat planes consistent with edema, possible cellulitis. No fluid collection to suggest an abscess. No soft tissue air. No radiopaque foreign body. IMPRESSION: 1. Diffuse forearm to wrist edema and/or cellulitis, but no evidence of an abscess. No soft tissue air to suggest fasciitis. 2. No fracture or bone lesion. No evidence of osteomyelitis. No joint effusion to suggest septic arthropathy. Electronically Signed   By: Lajean Manes M.D.   On: 05/07/2022 19:48   CT FOREARM RIGHT WO CONTRAST  Result Date: 05/07/2022 CLINICAL DATA:  Pain and swelling. Concern for abscess. Possible  injury at work 2 days ago. EXAM: CT OF THE RIGHT HAND WITHOUT CONTRAST CT OF THE  RIGHT FOREARM WITHOUT CONTRAST TECHNIQUE: Multidetector CT imaging of the right hand was performed according to the standard protocol. Multiplanar CT image reconstructions were also generated. RADIATION DOSE REDUCTION: This exam was performed according to the departmental dose-optimization program which includes automated exposure control, adjustment of the mA and/or kV according to patient size and/or use of iterative reconstruction technique. COMPARISON:  None Available. FINDINGS: Bones/Joint/Cartilage No fracture. No bone lesion. No bone resorption to suggest osteomyelitis. Elbow and wrist joints are normally spaced and aligned. No joint effusion. Ligaments Suboptimally assessed by CT. Muscles and Tendons Muscles unremarkable.  Tendons intact. Soft tissues Diffuse increased attenuation in the subcutaneous and deeper fat planes consistent with edema, possible cellulitis. No fluid collection to suggest an abscess. No soft tissue air. No radiopaque foreign body. IMPRESSION: 1. Diffuse forearm to wrist edema and/or cellulitis, but no evidence of an abscess. No soft tissue air to suggest fasciitis. 2. No fracture or bone lesion. No evidence of osteomyelitis. No joint effusion to suggest septic arthropathy. Electronically Signed   By: Lajean Manes M.D.   On: 05/07/2022 19:48   DG Hand 2 View Right  Result Date: 05/07/2022 CLINICAL DATA:  Concern for foreign body at first MCP joint. EXAM: RIGHT HAND - 2 VIEW COMPARISON:  Right hand radiograph Aug 19 2017 FINDINGS: There is no evidence of fracture or dislocation. There is no evidence of arthropathy or other focal bone abnormality. There is a 2 mm superficial faint radiodensity within the soft tissues overlying the first MCP joint. There is also significant surrounding soft tissue edema. IMPRESSION: 1. No acute fracture or dislocation. 2. There is a 2 mm superficial faint radiodensity  within the soft tissues overlying the first MCP joint, with significant surrounding edema, concerning for a foreign body. Electronically Signed   By: Beryle Flock M.D.   On: 05/07/2022 17:44     Terri Piedra, NP [facility] Infectious [facility]  05/11/2022  8:29 AM

## 2022-05-11 NOTE — Assessment & Plan Note (Addendum)
Creatinine is at baseline. 2.07 today.

## 2022-05-11 NOTE — Progress Notes (Signed)
PROGRESS NOTE  John Gregory [id number] DOB: October 19, 1965 DOA: 05/09/2022 PCP: Letta Median, MD  Brief History   57 year old with past medical history significant for hypertension, CKD stage IIIb, osteoarthritis, dyslipidemia, or urolithiasis, polycystic kidney disease presents to the ED with acute onset of right hand pain worsening 3 days prior to admission, developed swelling erythema tenderness, he does not recall any injury.  He does work in  [employer] and he does sustain some injury intermittently, does not recall any recent trauma.  He was admitted at [facility] and started on IV antibiotics with no improvement. MRI show abscess formation, tenosynovitis.  Transferred to [facility] for hand surgery evaluation.   The patient was admitted. He was placed on linezolid, cefepime, and metronidazole. The patient was evaluated by hand surgery and underwent I & D of abscess along the first metacarpal on 05/09/2022. Gram stain of surgical specimens has not revealed any organisms and cultures have had no growth. Recommendation from ID is to continue current broad spectrum antibiotics for now and to narrow antibiotics when possible.   Consultants  Infectious Disease Hand Surgery  Procedures  I & D of hand 05/09/2022  Antibiotics  Linezolid Cefepime Metronidazole  Subjective  The patient is resting comfortably. He states that his hand still hurts. He is able to move his fingers without difficulty.  Objective   Vitals:  Vitals:   05/11/22 0538 05/11/22 0735  BP: (!) 145/91 (!) 139/90  Pulse: (!) 55 (!) 51  Resp: 17 17  Temp: 98.1 F (36.7 C) 98.4 F (36.9 C)  SpO2: 97% 98%    Exam:  Constitutional:  Appears calm and comfortable Respiratory:  CTA bilaterally, no w/r/r.  Respiratory effort normal. No retractions or accessory muscle use Cardiovascular:  RRR, no m/r/g No LE extremity edema   Normal pedal pulses Abdomen:  Abdomen appears normal; no tenderness or  masses No hernias No HSM Musculoskeletal:  Digits/nails BUE: no clubbing, cyanosis, petechiae, infection exam of joints, bones, muscles of at least one of following: head/neck, RUE, LUE, RLE, LLE   strength and tone normal, no atrophy, no abnormal movements No tenderness, masses Normal ROM, no contractures  Right hand is bandaged Skin:  No rashes, lesions, ulcers palpation of skin: no induration or nodules Neurologic:  CN 2-12 intact Sensation all 4 extremities intact Psychiatric:  Mental status Mood, affect appropriate Orientation to person, place, time  judgment and insight appear intact     I have personally reviewed the following:   Today's Data   Vitals:   05/11/22 0538 05/11/22 0735  BP: (!) 145/91 (!) 139/90  Pulse: (!) 55 (!) 51  Resp: 17 17  Temp: 98.1 F (36.7 C) 98.4 F (36.9 C)  SpO2: 97% 98%     Lab Data      Latest Ref Rng & Units 05/11/2022    5:45 AM 05/09/2022    6:32 PM 05/08/2022    4:11 AM  CBC  WBC 4.0 - 10.5 K/uL 11.8  13.7  15.3   Hemoglobin 13.0 - 17.0 g/dL 10.6  11.4  11.3   Hematocrit 39.0 - 52.0 % 32.2  34.3  35.3   Platelets 150 - 400 K/uL 235  177  144       Latest Ref Rng & Units 05/11/2022    5:45 AM 05/09/2022    6:32 PM 05/09/2022    3:26 AM  BMP  Glucose 70 - 99 mg/dL 92  85    BUN 6 - 20  mg/dL 15  16    Creatinine 0.61 - 1.24 mg/dL 2.17  2.16  2.50   Sodium 135 - 145 mmol/L 135  137    Potassium 3.5 - 5.1 mmol/L 4.4  4.2    Chloride 98 - 111 mmol/L 108  110    CO2 22 - 32 mmol/L 18  18    Calcium 8.9 - 10.3 mg/dL 8.1  8.2       Micro Data   Results for orders placed or performed during the hospital encounter of 05/09/22  Culture, fungus without smear     Status: None (Preliminary result)   Collection Time: 05/09/22  9:43 PM   Specimen: Hand, Right; Abscess  Result Value Ref Range Status   Specimen Description ABSCESS  Final   Special Requests HAND,RIGHT  Final   Culture   Final    NO FUNGUS ISOLATED AFTER 1  DAY Performed at [facility], [address], [place], [place] [address]    Report Status PENDING  Incomplete  Aerobic/Anaerobic Culture w Gram Stain (surgical/deep wound)     Status: None (Preliminary result)   Collection Time: 05/09/22  9:43 PM   Specimen: Hand, Right; Abscess  Result Value Ref Range Status   Specimen Description ABSCESS  Final   Special Requests RH  Final   Gram Stain   Final    RARE WBC PRESENT, PREDOMINANTLY PMN NO ORGANISMS SEEN    Culture   Final    RARE GROUP A STREP (S.PYOGENES) ISOLATED Beta hemolytic streptococci are predictably susceptible to penicillin and other beta lactams. Susceptibility testing not routinely performed. Performed at [facility], [place] [address], [place],  [address]    Report Status PENDING  Incomplete    Scheduled Meds:  amLODipine  10 mg Oral Daily   atorvastatin  40 mg Oral q1800   chlorhexidine   Topical Daily   heparin  5,000 Units Subcutaneous Q8H   lidocaine  20 mL Other Once   polyethylene glycol  17 g Oral BID   Continuous Infusions:  sodium chloride 75 mL/hr at 05/09/22 1814   ceFEPime (MAXIPIME) IV 2 g (05/11/22 0955)   linezolid (ZYVOX) IV 600 mg (05/11/22 1138)   metronidazole Stopped (05/11/22 0610)    Principal Problem:   Hand abscess Active Problems:   Sepsis due to cellulitis (HCC)   Cellulitis of multiple sites of right hand and fingers   Acute kidney injury superimposed on chronic kidney disease (HCC)   HLD (hyperlipidemia)   Chronic kidney disease (CKD), stage IIIb   Essential hypertension   Dyslipidemia   LOS: 2 days   A & P  Abscess of finger of right hand S/P I & D of abscess along first metacarpal 05/09/2022. Gram stain negative and no growth from culture as of yet.  Chronic kidney disease (CKD), stage IIIb Creatinine is at baseline.  Dyslipidemia Continue atorvastatin as at home.  Essential hypertension The patient is normotensive on amlodipine and lotensin as at  home. Monitor.  DVT prophylaxis: Heparin Code Status: Full Code Family Communication: None available Disposition Plan: tbd    Sheilyn Boehlke, DO [facility] Direct contact: see [email or web address]  7PM-7AM contact night coverage as above 05/11/2022, 4:22 PM  LOS: 2 days

## 2022-05-12 ENCOUNTER — Inpatient Hospital Stay (HOSPITAL_COMMUNITY): Payer: Commercial Managed Care - PPO

## 2022-05-12 DIAGNOSIS — M25461 Effusion, right knee: Secondary | ICD-10-CM | POA: Diagnosis not present

## 2022-05-12 DIAGNOSIS — L02511 Cutaneous abscess of right hand: Secondary | ICD-10-CM

## 2022-05-12 LAB — CULTURE, BLOOD (ROUTINE X 2)
Culture: NO GROWTH
Culture: NO GROWTH
Special Requests: ADEQUATE

## 2022-05-12 LAB — BASIC METABOLIC PANEL
Anion gap: 15 (ref 5–15)
BUN: 13 mg/dL (ref 6–20)
CO2: 20 mmol/L — ABNORMAL LOW (ref 22–32)
Calcium: 8.9 mg/dL (ref 8.9–10.3)
Chloride: 100 mmol/L (ref 98–111)
Creatinine, Ser: 2.07 mg/dL — ABNORMAL HIGH (ref 0.61–1.24)
GFR, Estimated: 37 mL/min — ABNORMAL LOW (ref >60)
Glucose, Bld: 105 mg/dL — ABNORMAL HIGH (ref 70–99)
Potassium: 4 mmol/L (ref 3.5–5.1)
Sodium: 135 mmol/L (ref 135–145)

## 2022-05-12 LAB — CBC WITH DIFFERENTIAL/PLATELET
Abs Immature Granulocytes: 0.25 10*3/uL — ABNORMAL HIGH (ref 0.00–0.07)
Basophils Absolute: 0 10*3/uL (ref 0.0–0.1)
Basophils Relative: 0 %
Eosinophils Absolute: 0.4 10*3/uL (ref 0.0–0.5)
Eosinophils Relative: 4 %
HCT: 37 % — ABNORMAL LOW (ref 39.0–52.0)
Hemoglobin: 12.3 g/dL — ABNORMAL LOW (ref 13.0–17.0)
Immature Granulocytes: 3 %
Lymphocytes Relative: 17 %
Lymphs Abs: 1.6 10*3/uL (ref 0.7–4.0)
MCH: 28.4 pg (ref 26.0–34.0)
MCHC: 33.2 g/dL (ref 30.0–36.0)
MCV: 85.5 fL (ref 80.0–100.0)
Monocytes Absolute: 1.2 10*3/uL — ABNORMAL HIGH (ref 0.1–1.0)
Monocytes Relative: 12 %
Neutro Abs: 6.3 10*3/uL (ref 1.7–7.7)
Neutrophils Relative %: 64 %
Platelets: 295 10*3/uL (ref 150–400)
RBC: 4.33 MIL/uL (ref 4.22–5.81)
RDW: 13.2 % (ref 11.5–15.5)
WBC: 9.8 10*3/uL (ref 4.0–10.5)
nRBC: 0 % (ref 0.0–0.2)

## 2022-05-12 LAB — SYNOVIAL CELL COUNT + DIFF, W/ CRYSTALS
Crystals, Fluid: NONE SEEN
Eosinophils-Synovial: 0 % (ref 0–1)
Lymphocytes-Synovial Fld: 4 % (ref 0–20)
Monocyte-Macrophage-Synovial Fluid: 9 % — ABNORMAL LOW (ref 50–90)
Neutrophil, Synovial: 87 % — ABNORMAL HIGH (ref 0–25)
WBC, Synovial: 11100 /mm3 — ABNORMAL HIGH (ref 0–200)

## 2022-05-12 LAB — ACID FAST SMEAR (AFB, MYCOBACTERIA): Acid Fast Smear: NEGATIVE

## 2022-05-12 MED ORDER — BUPIVACAINE HCL (PF) 0.5 % IJ SOLN
10.0000 mL | Freq: Once | INTRAMUSCULAR | Status: AC
  Administered 2022-05-12: 10 mL
  Filled 2022-05-12: qty 10

## 2022-05-12 MED ORDER — METHYLPREDNISOLONE ACETATE 40 MG/ML IJ SUSP
40.0000 mg | Freq: Once | INTRAMUSCULAR | Status: AC
  Administered 2022-05-12: 40 mg via INTRA_ARTICULAR
  Filled 2022-05-12: qty 1

## 2022-05-12 MED ORDER — OXYCODONE HCL 5 MG PO TABS
5.0000 mg | ORAL_TABLET | ORAL | Status: DC | PRN
  Administered 2022-05-12 – 2022-05-15 (×12): 10 mg via ORAL
  Filled 2022-05-12: qty 1
  Filled 2022-05-12 (×7): qty 2
  Filled 2022-05-12: qty 1
  Filled 2022-05-12 (×4): qty 2

## 2022-05-12 MED ORDER — LINEZOLID 600 MG/300ML IV SOLN
600.0000 mg | Freq: Two times a day (BID) | INTRAVENOUS | Status: DC
  Administered 2022-05-12 – 2022-05-15 (×7): 600 mg via INTRAVENOUS
  Filled 2022-05-12 (×8): qty 300

## 2022-05-12 NOTE — Assessment & Plan Note (Signed)
The patient has undergone I & D with hand surgery. He is receiving ceftriaxone for GAS grown from operative cultures. Will continue wound care as detailed by hand surgery and follow CRP and CBC as recommended by hand surgery. When patient is appropriate for discharge he will need to follow up with hand surgery in 1 week.

## 2022-05-12 NOTE — Progress Notes (Signed)
S: Patient is doing well.  He is postop day 3 status post I&D of the right hand.  His pain has significantly improved.  Nursing has been performing daily antibiotic soap soaks and changing the iodoform packing.  White count normalized to 9.8.  CRP 6.7.  Preliminary cultures demonstrate beta hemolytic strep (pyogenes).  O: Sensation intact.  He is able to wiggle fingers.  Dressings are clean.  A/P: Status post I&D of the right hand.    Continue daily antibacterial soap soaks for 15 minutes with packing change at that time.  Each day the packing should be packed slightly looser than it was the time before.   I recommend continued every 48 hour CRP and CBC labs to monitor for resolution of his infection.  Patient will need to follow-up with me in  1 week.  Wadie Lessen, MD Hand & Upper Extremity Surgery [facility] [place]

## 2022-05-12 NOTE — Progress Notes (Signed)
Overnight event  Patient complained of severe right knee pain.  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  No acute displaced fractures identified.  Needs to be evaluated by orthopedics in the morning given joint effusion.  Continue pain management.  Already on antibiotics due to abscess of finger of right hand, status post I&D.

## 2022-05-12 NOTE — Assessment & Plan Note (Signed)
Operative cultures have grown out strep pyogenes that is sensitive to ceftriaxone. Susceptibilities are pending.

## 2022-05-12 NOTE — Assessment & Plan Note (Signed)
Resolving slowly. Creatinine is 2.07 today. Continue to monitor. Avoid nephrotoxins and hypotension. Follow creatinine, electrolytes, and volume status.

## 2022-05-12 NOTE — Assessment & Plan Note (Addendum)
The knee is warm and tender to palpation. X-ray of the knee has demonstrated a significant effusion. Orthopedic surgery has been consulted and has tapped the knee. Awaiting result of fluid. The patient's oxycodone was increased to 5-10 mg and dilaudid was discontinued.

## 2022-05-12 NOTE — Procedures (Signed)
Procedure: Right knee aspiration and injection   Indication: Right knee effusion(s)   Surgeon: Silvestre Gunner, PA-C   Assist: None   Anesthesia: Topical refrigerant   EBL: None   Complications: None   Findings: After risks/benefits explained patient desires to undergo procedure. Consent obtained and time out performed. The right knee was sterilely prepped and aspirated. 67m clear pale yellow fluid obtained. 618m0.5% Marcaine and 4018mepo-medrol instilled. Pt tolerated the procedure well.       MicLisette AbuA-C Orthopedic Surgery [phone]

## 2022-05-12 NOTE — Progress Notes (Signed)
PROGRESS NOTE  John Gregory [id number] DOB: [birth year] DOA: 05/09/2022 PCP: Letta Median, MD  Brief History   57 year old with past medical history significant for hypertension, CKD stage IIIb, osteoarthritis, dyslipidemia, or urolithiasis, polycystic kidney disease presents to the ED with acute onset of right hand pain worsening 3 days prior to admission, developed swelling erythema tenderness, he does not recall any injury.  He does work in  [employer] and he does sustain some injury intermittently, does not recall any recent trauma.  He was admitted at [facility] and started on IV antibiotics with no improvement. MRI show abscess formation, tenosynovitis.  Transferred to [facility] for hand surgery evaluation.   The patient was admitted. He was placed on linezolid, cefepime, and metronidazole. The patient was evaluated by hand surgery and underwent I & D of abscess along the first metacarpal on 05/09/2022. Gram stain of surgical specimens has not revealed any organisms and cultures have had no growth. Recommendation from ID is to continue current broad spectrum antibiotics for now and to narrow antibiotics when possible.   Consultants  Infectious Disease Hand Surgery  Procedures  I & D of hand 05/09/2022  Antibiotics  Linezolid Cefepime Metronidazole  Subjective  The patient is resting comfortably. He states that his hand still hurts. He is able to move his fingers without difficulty.  Objective   Vitals:  Vitals:   05/12/22 0759 05/12/22 1602  BP: (!) 161/101 (!) 155/96  Pulse: 76 89  Resp: 16 17  Temp: 98.4 F (36.9 C) 98.9 F (37.2 C)  SpO2: 98% 99%    Exam:  Constitutional:  Appears calm and comfortable Respiratory:  CTA bilaterally, no w/r/r.  Respiratory effort normal. No retractions or accessory muscle use Cardiovascular:  RRR, no m/r/g No LE extremity edema   Normal pedal pulses Abdomen:  Abdomen appears normal; no tenderness or masses No  hernias No HSM Musculoskeletal:  Digits/nails BUE: no clubbing, cyanosis, petechiae, infection exam of joints, bones, muscles of at least one of following: head/neck, RUE, LUE, RLE, LLE   strength and tone normal, no atrophy, no abnormal movements No tenderness, masses Normal ROM, no contractures  Right hand is bandaged Skin:  No rashes, lesions, ulcers palpation of skin: no induration or nodules Neurologic:  CN 2-12 intact Sensation all 4 extremities intact Psychiatric:  Mental status Mood, affect appropriate Orientation to person, place, time  judgment and insight appear intact     I have personally reviewed the following:   Today's Data   Vitals:   05/12/22 0759 05/12/22 1602  BP: (!) 161/101 (!) 155/96  Pulse: 76 89  Resp: 16 17  Temp: 98.4 F (36.9 C) 98.9 F (37.2 C)  SpO2: 98% 99%     Lab Data      Latest Ref Rng & Units 05/12/2022    6:13 AM 05/11/2022    5:45 AM 05/09/2022    6:32 PM  CBC  WBC 4.0 - 10.5 K/uL 9.8  11.8  13.7   Hemoglobin 13.0 - 17.0 g/dL 12.3  10.6  11.4   Hematocrit 39.0 - 52.0 % 37.0  32.2  34.3   Platelets 150 - 400 K/uL 295  235  177       Latest Ref Rng & Units 05/12/2022    6:13 AM 05/11/2022    5:45 AM 05/09/2022    6:32 PM  BMP  Glucose 70 - 99 mg/dL 105  92  85   BUN 6 - 20 mg/dL 13  15  16   Creatinine 0.61 - 1.24 mg/dL 2.07  2.17  2.16   Sodium 135 - 145 mmol/L 135  135  137   Potassium 3.5 - 5.1 mmol/L 4.0  4.4  4.2   Chloride 98 - 111 mmol/L 100  108  110   CO2 22 - 32 mmol/L 20  18  18   $ Calcium 8.9 - 10.3 mg/dL 8.9  8.1  8.2      Micro Data   Results for orders placed or performed during the hospital encounter of 05/09/22  Culture, fungus without smear     Status: None (Preliminary result)   Collection Time: 05/09/22  9:43 PM   Specimen: Hand, Right; Abscess  Result Value Ref Range Status   Specimen Description ABSCESS  Final   Special Requests HAND,RIGHT  Final   Culture   Final    NO FUNGUS ISOLATED  AFTER 1 DAY Performed at [facility], [address] [place], [place] [address]    Report Status PENDING  Incomplete  Aerobic/Anaerobic Culture w Gram Stain (surgical/deep wound)     Status: None (Preliminary result)   Collection Time: 05/09/22  9:43 PM   Specimen: Hand, Right; Abscess  Result Value Ref Range Status   Specimen Description ABSCESS  Final   Special Requests RH  Final   Gram Stain   Final    RARE WBC PRESENT, PREDOMINANTLY PMN NO ORGANISMS SEEN    Culture   Final    RARE GROUP A STREP (S.PYOGENES) ISOLATED Beta hemolytic streptococci are predictably susceptible to penicillin and other beta lactams. Susceptibility testing not routinely performed. Performed at [facility], [place] [address], [place], [place] [address]    Report Status PENDING  Incomplete  Acid Fast Smear (AFB)     Status: None   Collection Time: 05/09/22  9:43 PM   Specimen: Hand, Right; Abscess  Result Value Ref Range Status   AFB Specimen Processing Concentration  Final   Acid Fast Smear Negative  Final    Comment: (NOTE) Performed At: [facility] [place], [place] [id number] Rush Farmer MD [id number]    Source (AFB) RH  Final    Comment: Performed at [facility], [place] [address], [place],  [address]    Scheduled Meds:  amLODipine  10 mg Oral Daily   atorvastatin  40 mg Oral q1800   chlorhexidine   Topical Daily   heparin  5,000 Units Subcutaneous Q8H   polyethylene glycol  17 g Oral BID   Continuous Infusions:  cefTRIAXone (ROCEPHIN)  IV Stopped (05/11/22 2135)   linezolid (ZYVOX) IV 600 mg (05/12/22 0904)    Principal Problem:   Hand abscess Active Problems:   Sepsis due to cellulitis (HCC)   Cellulitis of multiple sites of right hand and fingers   Chronic kidney disease (CKD), stage IIIb   Essential hypertension   Dyslipidemia   Abscess of finger of right hand   Effusion of right knee joint   LOS: 3 days   A & P   Abscess of finger of right hand S/P I & D of abscess along first metacarpal 05/09/2022. He is receiving ceftriaxone for GAS grown from operative cultures. Will continue wound care as detailed by hand surgery and follow CRP and CBC as recommended by hand surgery. When patient is appropriate for discharge he will need to follow up with hand surgery in 1 week.   Chronic kidney disease (CKD), stage  IIIb Creatinine is at baseline. 2.07 today.  Dyslipidemia Continue atorvastatin as at home.  Essential hypertension The patient is normotensive on amlodipine and lotensin as at home. Monitor.  Effusion of right knee joint The knee is warm and tender to palpation. X-ray of the knee has demonstrated a significant effusion. Orthopedic surgery has been consulted and has tapped the knee. Awaiting result of fluid. The patient's oxycodone was increased to 5-10 mg and dilaudid was discontinued.  Hand abscess The patient has undergone I & D with hand surgery. He is receiving ceftriaxone for GAS grown from operative cultures. Will continue wound care as detailed by hand surgery and follow CRP and CBC as recommended by hand surgery. When patient is appropriate for discharge he will need to follow up with hand surgery in 1 week.  Acute kidney injury superimposed on chronic kidney disease (HCC) Resolving slowly. Creatinine is 2.07 today. Continue to monitor. Avoid nephrotoxins and hypotension. Follow creatinine, electrolytes, and volume status.   Cellulitis of multiple sites of right hand and fingers Operative cultures have grown out strep pyogenes that is sensitive to ceftriaxone. Susceptibilities are pending.  Sepsis due to cellulitis ([place]) Resolved.  DVT prophylaxis: Heparin Code Status: Full Code Family Communication: None available Disposition Plan: tbd    Myrel Rappleye, DO [facility] Direct contact: see [email or web address]  7PM-7AM contact night coverage as above 05/12/2022, 4:06 PM  LOS: 2 days

## 2022-05-12 NOTE — Progress Notes (Addendum)
I have seen and examined the patient. I have personally reviewed the clinical findings, laboratory findings, microbiological data and imaging studies. The assessment and treatment plan was discussed with the Nurse Practitioner Mauricio Po. I agree with her/his recommendations except following additions/corrections.  Afebrile Acute onset rt knee pain and swelling last night followed by pain and swelling in the rt ankle this morning. Not aware of h/o Gout  2/13 OR cx with Group A strep   Exam - rt hand in a dressing, able to squeeze; rt knee with large swelling, warmth, TTP and restricted ROM. RT dorsal foot and ankle with swelling and warmth. No fluctuance or crepitus in rt leg.  RT ankle ROM restricted due to pain.   Xray rt knee with moderate effusion  Xray rt ankle with soft tissue swelling noted over dorsum of foot, no joint effusion  Orthopedics has been consulted   Continue Linezolid and ceftriaxone pending above  Following Orthopedic recs and possible arthrocentesis of rt knee as well as OR cx 2/13  Dr Baxter Flattery covering this weekend, new ID team starting Monday.   [name], MD Infectious Disease Physician [facility] [address] [place], [place] [address] Phone: [phone]  Fax: [facility] Infect[facility]  Date of Admission:  05/09/2022     Total days of antibiotics 6         ASSESSMENT:  John Gregory's surgical specimen is positive for Group A Streptococcus and is tolerating the Ceftriaxone with no adverse side effects. Developed acute onset right knee pain with x-ray showing mild effusion and now right ankle pain and swelling. X-rays right ankle with soft tissue swelling over the dorsum of the foot. Unclear source of these findings as he was not bacteremic and infection certainly remains a differential along with gout. Awaiting orthopedic evaluation. Continue current dose of  Ceftriaxone. Post-operative wound care per Orthopedics with remaining medical and supportive care per Internal Medicine.   PLAN:  Continue current dose of Ceftriaxone Post-operative wound care per Orthopedics Awaiting further Orthopedic evaluation for right knee and right foot/ankle.  Remaining medical and and supportive care per Internal Medicine.  Dr. Baxter Flattery is available over the weekend for ID related questions.   Principal Problem:   Hand abscess Active Problems:   Chronic kidney disease (CKD), stage IIIb   Sepsis due to cellulitis [facility])   Essential hypertension   Dyslipidemia   Cellulitis of multiple sites of right hand and fingers   Acute kidney injury superimposed on chronic kidney disease (HCC)   Abscess of finger of right hand    amLODipine  10 mg Oral Daily   atorvastatin  40 mg Oral q1800   bupivacaine(PF)  10 mL Infiltration Once   chlorhexidine   Topical Daily   heparin  5,000 Units Subcutaneous Q8H   methylPREDNISolone acetate  40 mg Intra-articular Once   polyethylene glycol  17 g Oral BID    SUBJECTIVE:  Afebrile overnight with acute onset ankle pain with x-ray showing moderate effusion and awaiting Orthopedic evaluation. Also now having acute right ankle edema and pain.   Allergies  Allergen Reactions   Bactrim [Sulfamethoxazole-Trimethoprim] Itching   Calcium Other (See Comments)    Kidney stones   Penicillins Swelling    angioedema     Review of Systems: Review of Systems  Constitutional:  Negative for chills, fever and weight loss.  Respiratory:  Negative for cough, shortness of breath and wheezing.  Cardiovascular:  Negative for chest pain and leg swelling.  Gastrointestinal:  Negative for abdominal pain, constipation, diarrhea, nausea and vomiting.  Musculoskeletal:        Right knee and ankle pain  Skin:  Negative for rash.      OBJECTIVE: Vitals:   05/11/22 1650 05/11/22 1915 05/12/22 0422 05/12/22 0759  BP: (!) 157/99 (!) 148/95  (!) 136/96 (!) 161/101  Pulse: 64 63 69 76  Resp: 17 16 16 16  $ Temp: 98.4 F (36.9 C) 98.5 F (36.9 C) 98.2 F (36.8 C) 98.4 F (36.9 C)  TempSrc: Oral Oral Oral Oral  SpO2: 100% 97%  98%   There is no height or weight on file to calculate BMI.  Physical Exam Constitutional:      General: He is not in acute distress.    Appearance: He is well-developed.  Cardiovascular:     Rate and Rhythm: Normal rate and regular rhythm.     Heart sounds: Normal heart sounds.  Pulmonary:     Effort: Pulmonary effort is normal.     Breath sounds: Normal breath sounds.  Musculoskeletal:     Comments: Post surgical dressing clean and dry. Right knee and right ankle/foot with obvious edema and generalized tenderness. Range of motion limited secondary to pain.   Skin:    General: Skin is warm and dry.  Neurological:     Mental Status: He is alert.          Lab Results Lab Results  Component Value Date   WBC 9.8 05/12/2022   HGB 12.3 (L) 05/12/2022   HCT 37.0 (L) 05/12/2022   MCV 85.5 05/12/2022   PLT 295 05/12/2022    Lab Results  Component Value Date   CREATININE 2.07 (H) 05/12/2022   BUN 13 05/12/2022   NA 135 05/12/2022   K 4.0 05/12/2022   CL 100 05/12/2022   CO2 20 (L) 05/12/2022    Lab Results  Component Value Date   ALT 20 05/07/2022   AST 22 05/07/2022   ALKPHOS 86 05/07/2022   BILITOT 1.6 (H) 05/07/2022     Microbiology: Recent Results (from the past 240 hour(s))  Blood Culture (routine x 2)     Status: None   Collection Time: 05/07/22  7:13 PM   Specimen: BLOOD  Result Value Ref Range Status   Specimen Description BLOOD LEFT ANTECUBITAL  Final   Special Requests   Final    BOTTLES DRAWN AEROBIC AND ANAEROBIC Blood Culture results may not be optimal due to an inadequate volume of blood received in culture bottles   Culture   Final    NO GROWTH 5 DAYS Performed at [facility], [address], [place], [place] [address]    Report Status 05/12/2022  FINAL  Final  Blood Culture (routine x 2)     Status: None   Collection Time: 05/07/22  8:22 PM   Specimen: BLOOD  Result Value Ref Range Status   Specimen Description BLOOD HAND  Final   Special Requests   Final    BOTTLES DRAWN AEROBIC AND ANAEROBIC Blood Culture adequate volume   Culture   Final    NO GROWTH 5 DAYS Performed at [facility], [address], [place], [place] [address]    Report Status 05/12/2022 FINAL  Final  Culture, fungus without smear     Status: None (Preliminary result)   Collection Time: 05/09/22  9:43 PM   Specimen: Hand, Right; Abscess  Result Value Ref  Range Status   Specimen Description ABSCESS  Final   Special Requests HAND,RIGHT  Final   Culture   Final    NO FUNGUS ISOLATED AFTER 1 DAY Performed at [facility], [address], [place], [place] [address]    Report Status PENDING  Incomplete  Aerobic/Anaerobic Culture w Gram Stain (surgical/deep wound)     Status: None (Preliminary result)   Collection Time: 05/09/22  9:43 PM   Specimen: Hand, Right; Abscess  Result Value Ref Range Status   Specimen Description ABSCESS  Final   Special Requests RH  Final   Gram Stain   Final    RARE WBC PRESENT, PREDOMINANTLY PMN NO ORGANISMS SEEN    Culture   Final    RARE GROUP A STREP (S.PYOGENES) ISOLATED Beta hemolytic streptococci are predictably susceptible to penicillin and other beta lactams. Susceptibility testing not routinely performed. Performed at [facility], [place] [address] [place], [place] [address]    Report Status PENDING  Incomplete   Imaging  DG Ankle 2 Views Right  Result Date: 05/12/2022 CLINICAL DATA:  Swelling EXAM: RIGHT ANKLE - 2 VIEW COMPARISON:  Right foot x-ray 06/05/2015 FINDINGS: There is no evidence of fracture, dislocation, or joint effusion. Mild midfoot arthropathy. Soft tissue swelling is noted over the dorsum of the foot. IMPRESSION: Soft tissue swelling over the dorsum of the foot. No acute  osseous abnormality. Electronically Signed   By: Davina Poke D.O.   On: 05/12/2022 09:30   DG Knee Complete 4 Views Right  Result Date: 05/12/2022 CLINICAL DATA:  Knee pain EXAM: RIGHT KNEE - COMPLETE 4+ VIEW COMPARISON:  None Available. FINDINGS: Degenerative changes in the right knee with lateral greater than medial compartment narrowing and tricompartment osteophyte formation. There is a moderate right knee effusion. Bipartite patella. No evidence of acute fracture or dislocation. Soft tissues are unremarkable. IMPRESSION: Moderate degenerative changes in the right knee. Bipartite patella. Moderate effusion. No acute displaced fractures identified. Electronically Signed   By: Lucienne Capers M.D.   On: 05/12/2022 00:21     Terri Piedra, NP [facility]  05/12/2022  11:41 AM

## 2022-05-12 NOTE — Assessment & Plan Note (Signed)
Resolved

## 2022-05-12 NOTE — Consult Note (Signed)
Reason for Consult:Right knee effusion Referring Physician: Ava Swayze Time called: [id number] Time at bedside: John Gregory is an 57 y.o. male.  HPI: John Gregory began to have right knee and ankle pain yesterday. He was admitted with right hand pain and went to the OR with hand surgery. He admits to right foot pain about 10y ago which may have been gout but unsure. Orthopedic surgery was consulted for arthrocentesis.  Past Medical History:  Diagnosis Date   Arthritis    hands, shoulders   Chronic kidney disease (CKD), stage IIIb    HLD (hyperlipidemia)    Hypertension    Kidney stone    Kidney stone    Kidney stones    Polycystic kidney disease    Renal disorder    Wears contact lenses    Wears dentures    partial upper and lower    Past Surgical History:  Procedure Laterality Date   ANKLE SURGERY Left    EXCISION MASS NECK Left 10/31/2017   Procedure: EXCISION MASS NECK;  Surgeon: Carloyn Manner, MD;  Location: [facility];  Service: ENT;  Laterality: Left;   I & D EXTREMITY Right 05/09/2022   Procedure: IRRIGATION AND DEBRIDEMENT RIGHT HAND;  Surgeon: Ellard Artis, MD;  Location: [facility];  Service: Orthopedics;  Laterality: Right;   INGUINAL HERNIA REPAIR Bilateral 07/25/2019   Procedure: HERNIA REPAIR INGUINAL ADULT;  Surgeon: Fredirick Maudlin, MD;  Location: [facility];  Service: General;  Laterality: Bilateral;   KNEE SURGERY Bilateral    LEFT HEART CATH AND CORONARY ANGIOGRAPHY N/A 05/25/2021   Procedure: LEFT HEART CATH AND CORONARY ANGIOGRAPHY;  Surgeon: Nelva Bush, MD;  Location: [facility];  Service: Cardiovascular;  Laterality: N/A;   SHOULDER SURGERY Left    WRIST SURGERY Left     Family History  Problem Relation Age of Onset   Polycystic kidney disease Mother    Polycystic kidney disease Sister     Social History:  reports that he has never smoked. His smokeless tobacco use includes snuff. He reports that he does not drink  alcohol and does not use drugs.  Allergies:  Allergies  Allergen Reactions   Bactrim [Sulfamethoxazole-Trimethoprim] Itching   Calcium Other (See Comments)    Kidney stones   Penicillins Swelling    angioedema    Medications: I have reviewed the patient's current medications.  Results for orders placed or performed during the hospital encounter of 05/09/22 (from the past 48 hour(s))  CBC     Status: Abnormal   Collection Time: 05/11/22  5:45 AM  Result Value Ref Range   WBC 11.8 (H) 4.0 - 10.5 K/uL   RBC 3.77 (L) 4.22 - 5.81 MIL/uL   Hemoglobin 10.6 (L) 13.0 - 17.0 g/dL   HCT 32.2 (L) 39.0 - 52.0 %   MCV 85.4 80.0 - 100.0 fL   MCH 28.1 26.0 - 34.0 pg   MCHC 32.9 30.0 - 36.0 g/dL   RDW 13.2 11.5 - 15.5 %   Platelets 235 150 - 400 K/uL   nRBC 0.0 0.0 - 0.2 %    Comment: Performed at [facility], [place] [address], [place], [place] [id number]  Basic metabolic panel     Status: Abnormal   Collection Time: 05/11/22  5:45 AM  Result Value Ref Range   Sodium 135 135 - 145 mmol/L   Potassium 4.4 3.5 - 5.1 mmol/L   Chloride 108 98 - 111 mmol/L   CO2  18 (L) 22 - 32 mmol/L   Glucose, Bld 92 70 - 99 mg/dL    Comment: Glucose reference range applies only to samples taken after fasting for at least 8 hours.   BUN 15 6 - 20 mg/dL   Creatinine, Ser 2.17 (H) 0.61 - 1.24 mg/dL   Calcium 8.1 (L) 8.9 - 10.3 mg/dL   GFR, Estimated 35 (L) >60 mL/min    Comment: (NOTE) Calculated using the CKD-EPI Creatinine Equation (2021)    Anion gap 9 5 - 15    Comment: Performed at [facility] [address], [place], [place] [address]  Lactic acid, plasma     Status: None   Collection Time: 05/11/22  9:34 AM  Result Value Ref Range   Lactic Acid, Venous 1.0 0.5 - 1.9 mmol/L    Comment: Performed at [facility] [address] [place], [place] [address]  Lactic acid, plasma     Status: None   Collection Time: 05/11/22 11:29 AM  Result Value Ref Range   Lactic Acid, Venous 0.9  0.5 - 1.9 mmol/L    Comment: Performed at [facility] [address] [place], [place] [address]  Sedimentation rate     Status: Abnormal   Collection Time: 05/11/22  7:21 PM  Result Value Ref Range   Sed Rate 97 (H) 0 - 16 mm/hr    Comment: Performed at [facility] [address] [place], [place] [address]  C-reactive protein     Status: Abnormal   Collection Time: 05/11/22  7:21 PM  Result Value Ref Range   CRP 6.7 (H) <1.0 mg/dL    Comment: Performed at [facility], [place] [address] [place], [place] [address]  CBC with Differential/Platelet     Status: Abnormal   Collection Time: 05/12/22  6:13 AM  Result Value Ref Range   WBC 9.8 4.0 - 10.5 K/uL   RBC 4.33 4.22 - 5.81 MIL/uL   Hemoglobin 12.3 (L) 13.0 - 17.0 g/dL   HCT 37.0 (L) 39.0 - 52.0 %   MCV 85.5 80.0 - 100.0 fL   MCH 28.4 26.0 - 34.0 pg   MCHC 33.2 30.0 - 36.0 g/dL   RDW 13.2 11.5 - 15.5 %   Platelets 295 150 - 400 K/uL   nRBC 0.0 0.0 - 0.2 %   Neutrophils Relative % 64 %   Neutro Abs 6.3 1.7 - 7.7 K/uL   Lymphocytes Relative 17 %   Lymphs Abs 1.6 0.7 - 4.0 K/uL   Monocytes Relative 12 %   Monocytes Absolute 1.2 (H) 0.1 - 1.0 K/uL   Eosinophils Relative 4 %   Eosinophils Absolute 0.4 0.0 - 0.5 K/uL   Basophils Relative 0 %   Basophils Absolute 0.0 0.0 - 0.1 K/uL   Immature Granulocytes 3 %   Abs Immature Granulocytes 0.25 (H) 0.00 - 0.07 K/uL    Comment: Performed at [facility] [address] [place], [place] [id number]  Basic metabolic panel     Status: Abnormal   Collection Time: 05/12/22  6:13 AM  Result Value Ref Range   Sodium 135 135 - 145 mmol/L   Potassium 4.0 3.5 - 5.1 mmol/L   Chloride 100 98 - 111 mmol/L   CO2 20 (L) 22 - 32 mmol/L   Glucose, Bld 105 (H) 70 - 99 mg/dL    Comment: Glucose reference range applies only to samples taken after fasting for at  least 8 hours.   BUN 13 6 - 20 mg/dL   Creatinine, Ser 2.07 (H) 0.61 - 1.24 mg/dL   Calcium 8.9 8.9 - 10.3 mg/dL    GFR, Estimated 37 (L) >60 mL/min    Comment: (NOTE) Calculated using the CKD-EPI Creatinine Equation (2021)    Anion gap 15 5 - 15    Comment: Performed at [facility] [address] [place], [place] [address]    DG Ankle 2 Views Right  Result Date: 05/12/2022 CLINICAL DATA:  Swelling EXAM: RIGHT ANKLE - 2 VIEW COMPARISON:  Right foot x-ray 06/05/2015 FINDINGS: There is no evidence of fracture, dislocation, or joint effusion. Mild midfoot arthropathy. Soft tissue swelling is noted over the dorsum of the foot. IMPRESSION: Soft tissue swelling over the dorsum of the foot. No acute osseous abnormality. Electronically Signed   By: Davina Poke D.O.   On: 05/12/2022 09:30   DG Knee Complete 4 Views Right  Result Date: 05/12/2022 CLINICAL DATA:  Knee pain EXAM: RIGHT KNEE - COMPLETE 4+ VIEW COMPARISON:  None Available. FINDINGS: Degenerative changes in the right knee with lateral greater than medial compartment narrowing and tricompartment osteophyte formation. There is a moderate right knee effusion. Bipartite patella. No evidence of acute fracture or dislocation. Soft tissues are unremarkable. IMPRESSION: Moderate degenerative changes in the right knee. Bipartite patella. Moderate effusion. No acute displaced fractures identified. Electronically Signed   By: Lucienne Capers M.D.   On: 05/12/2022 00:21    Review of Systems  Constitutional:  Negative for chills, diaphoresis and fever.  HENT:  Negative for ear discharge, ear pain, hearing loss and tinnitus.   Eyes:  Negative for photophobia and pain.  Respiratory:  Negative for cough and shortness of breath.   Cardiovascular:  Negative for chest pain.  Gastrointestinal:  Negative for abdominal pain, nausea and vomiting.  Genitourinary:  Negative for dysuria, flank pain, frequency and urgency.  Musculoskeletal:  Positive for arthralgias (Right knee/ankle). Negative for back pain, myalgias and neck pain.  Neurological:  Negative for  dizziness and headaches.  Hematological:  Does not bruise/bleed easily.  Psychiatric/Behavioral:  The patient is not nervous/anxious.    Blood pressure (!) 161/101, pulse 76, temperature 98.4 F (36.9 C), temperature source Oral, resp. rate 16, SpO2 98 %. Physical Exam Constitutional:      General: He is not in acute distress.    Appearance: He is well-developed. He is not diaphoretic.  HENT:     Head: Normocephalic and atraumatic.  Eyes:     General: No scleral icterus.       Right eye: No discharge.        Left eye: No discharge.     Conjunctiva/sclera: Conjunctivae normal.  Cardiovascular:     Rate and Rhythm: Normal rate and regular rhythm.  Pulmonary:     Effort: Pulmonary effort is normal. No respiratory distress.  Musculoskeletal:     Cervical back: Normal range of motion.     Comments: RLE No traumatic wounds, ecchymosis, or rash  Mod TTP knee, severe TTP foot/ankle  Large knee effusion  Sens DPN, SPN, TN intact  Motor EHL, ext, flex, evers 5/5  DP 2+, PT 2+, No significant edema  Skin:    General: Skin is warm and dry.  Neurological:     Mental Status: He is alert.  Psychiatric:        Mood and Affect: Mood normal.        Behavior: Behavior normal.  Assessment/Plan: Right knee effusion -- Have tapped knee.    Lisette Abu, PA-C Orthopedic Surgery [phone] 05/12/2022, 12:05 PM

## 2022-05-12 NOTE — Plan of Care (Signed)
  Problem: Education: Goal: Knowledge of General Education information will improve Description: Including pain rating scale, medication(s)/side effects and non-pharmacologic comfort measures Outcome: Progressing   Problem: Health Behavior/Discharge Planning: Goal: Ability to manage health-related needs will improve Outcome: Progressing   Problem: Clinical Measurements: Goal: Ability to maintain clinical measurements within normal limits will improve Outcome: Progressing Goal: Will remain free from infection Outcome: Progressing Goal: Diagnostic test results will improve Outcome: Progressing Goal: Respiratory complications will improve Outcome: Progressing Goal: Cardiovascular complication will be avoided Outcome: Progressing   Problem: Activity: Goal: Risk for activity intolerance will decrease Outcome: Progressing   Problem: Nutrition: Goal: Adequate nutrition will be maintained Outcome: Progressing   Problem: Coping: Goal: Level of anxiety will decrease Outcome: Progressing   Problem: Elimination: Goal: Will not experience complications related to bowel motility Outcome: Progressing Goal: Will not experience complications related to urinary retention Outcome: Progressing   Problem: Pain Managment: Goal: General experience of comfort will improve Outcome: Progressing   Problem: Safety: Goal: Ability to remain free from injury will improve Outcome: Progressing   Problem: Fluid Volume: Goal: Hemodynamic stability will improve Outcome: Progressing   Problem: Clinical Measurements: Goal: Diagnostic test results will improve Outcome: Progressing Goal: Signs and symptoms of infection will decrease Outcome: Progressing   Problem: Respiratory: Goal: Ability to maintain adequate ventilation will improve Outcome: Progressing

## 2022-05-13 DIAGNOSIS — M25461 Effusion, right knee: Secondary | ICD-10-CM

## 2022-05-13 DIAGNOSIS — N1832 Chronic kidney disease, stage 3b: Secondary | ICD-10-CM

## 2022-05-13 LAB — CBC WITH DIFFERENTIAL/PLATELET
Abs Immature Granulocytes: 0.32 10*3/uL — ABNORMAL HIGH (ref 0.00–0.07)
Basophils Absolute: 0 10*3/uL (ref 0.0–0.1)
Basophils Relative: 0 %
Eosinophils Absolute: 0 10*3/uL (ref 0.0–0.5)
Eosinophils Relative: 0 %
HCT: 37.2 % — ABNORMAL LOW (ref 39.0–52.0)
Hemoglobin: 12.3 g/dL — ABNORMAL LOW (ref 13.0–17.0)
Immature Granulocytes: 2 %
Lymphocytes Relative: 8 %
Lymphs Abs: 1.1 10*3/uL (ref 0.7–4.0)
MCH: 27.6 pg (ref 26.0–34.0)
MCHC: 33.1 g/dL (ref 30.0–36.0)
MCV: 83.6 fL (ref 80.0–100.0)
Monocytes Absolute: 1.3 10*3/uL — ABNORMAL HIGH (ref 0.1–1.0)
Monocytes Relative: 9 %
Neutro Abs: 11.5 10*3/uL — ABNORMAL HIGH (ref 1.7–7.7)
Neutrophils Relative %: 81 %
Platelets: 328 10*3/uL (ref 150–400)
RBC: 4.45 MIL/uL (ref 4.22–5.81)
RDW: 13 % (ref 11.5–15.5)
WBC: 14.3 10*3/uL — ABNORMAL HIGH (ref 4.0–10.5)
nRBC: 0 % (ref 0.0–0.2)

## 2022-05-13 LAB — BASIC METABOLIC PANEL
Anion gap: 12 (ref 5–15)
BUN: 14 mg/dL (ref 6–20)
CO2: 21 mmol/L — ABNORMAL LOW (ref 22–32)
Calcium: 8.9 mg/dL (ref 8.9–10.3)
Chloride: 98 mmol/L (ref 98–111)
Creatinine, Ser: 2.13 mg/dL — ABNORMAL HIGH (ref 0.61–1.24)
GFR, Estimated: 36 mL/min — ABNORMAL LOW (ref >60)
Glucose, Bld: 122 mg/dL — ABNORMAL HIGH (ref 70–99)
Potassium: 4.7 mmol/L (ref 3.5–5.1)
Sodium: 131 mmol/L — ABNORMAL LOW (ref 135–145)

## 2022-05-13 LAB — URIC ACID: Uric Acid, Serum: 6 mg/dL (ref 3.7–8.6)

## 2022-05-13 NOTE — Progress Notes (Signed)
PROGRESS NOTE  John Gregory [id number] DOB: 1965/12/18 DOA: 05/09/2022 PCP: Letta Median, MD  Brief History   57 year old with past medical history significant for hypertension, CKD stage IIIb, osteoarthritis, dyslipidemia, or urolithiasis, polycystic kidney disease presents to the ED with acute onset of right hand pain worsening 3 days prior to admission, developed swelling erythema tenderness, he does not recall any injury.  He does work in  [employer] and he does sustain some injury intermittently, does not recall any recent trauma.  He was admitted at [facility] and started on IV antibiotics with no improvement. MRI show abscess formation, tenosynovitis.  Transferred to [facility] for hand surgery evaluation.   The patient was admitted. He was placed on linezolid, cefepime, and metronidazole. The patient was evaluated by hand surgery and underwent I & D of abscess along the first metacarpal on 05/09/2022. Gram stain of surgical specimens has not revealed any organisms and cultures have had no growth. Recommendation from ID is to continue current broad spectrum antibiotics for now and to narrow antibiotics when possible.   On 05/12/2022 the patient complained of right knee pain. X-ray of the joint revealed a significant joint effusion. Orthopedic surgery was consulted and the joint was tapped. Results pending.  2/17: Blood pressure mildly elevated.  Preliminary synovial fluid cultures negative.  No crystals seen.  Uric acid normal.  Hand abscess cultures with group A strep.  Patient is on ceftriaxone.  Consultants  Infectious Disease Hand Surgery  Procedures  I & D of hand 05/09/2022  Antibiotics  Linezolid Cefepime Metronidazole Currently on ceftriaxone  Subjective  Patient was seen and examined today.  Knee pain and swelling improving.  Continue to have some pain in hand.  Objective   Vitals:  Vitals:   05/13/22 0525 05/13/22 0845  BP: (!) 149/99 (!) 164/97   Pulse: 63 (!) 59  Resp: 16 17  Temp: 98.1 F (36.7 C) 97.8 F (36.6 C)  SpO2: 100% 100%    Exam:  General.  Malnourished gentleman, in no acute distress. Pulmonary.  Lungs clear bilaterally, normal respiratory effort. CV.  Regular rate and rhythm, no JVD, rub or murmur. Abdomen.  Soft, nontender, nondistended, BS positive. CNS.  Alert and oriented .  No focal neurologic deficit. Extremities.  Right hand with bandage and Ace wrap, right knee with some edema. Psychiatry.  Judgment and insight appears normal.   I have personally reviewed the following:   Today's Data   Vitals:   05/13/22 0525 05/13/22 0845  BP: (!) 149/99 (!) 164/97  Pulse: 63 (!) 59  Resp: 16 17  Temp: 98.1 F (36.7 C) 97.8 F (36.6 C)  SpO2: 100% 100%     Lab Data      Latest Ref Rng & Units 05/13/2022    3:54 AM 05/12/2022    6:13 AM 05/11/2022    5:45 AM  CBC  WBC 4.0 - 10.5 K/uL 14.3  9.8  11.8   Hemoglobin 13.0 - 17.0 g/dL 12.3  12.3  10.6   Hematocrit 39.0 - 52.0 % 37.2  37.0  32.2   Platelets 150 - 400 K/uL 328  295  235       Latest Ref Rng & Units 05/13/2022    3:54 AM 05/12/2022    6:13 AM 05/11/2022    5:45 AM  BMP  Glucose 70 - 99 mg/dL 122  105  92   BUN 6 - 20 mg/dL 14  13  15   $ Creatinine 0.61 -  1.24 mg/dL 2.13  2.07  2.17   Sodium 135 - 145 mmol/L 131  135  135   Potassium 3.5 - 5.1 mmol/L 4.7  4.0  4.4   Chloride 98 - 111 mmol/L 98  100  108   CO2 22 - 32 mmol/L 21  20  18   $ Calcium 8.9 - 10.3 mg/dL 8.9  8.9  8.1      Micro Data   Results for orders placed or performed during the hospital encounter of 05/09/22  Culture, fungus without smear     Status: None (Preliminary result)   Collection Time: 05/09/22  9:43 PM   Specimen: Hand, Right; Abscess  Result Value Ref Range Status   Specimen Description ABSCESS  Final   Special Requests HAND,RIGHT  Final   Culture   Final    NO FUNGUS ISOLATED AFTER 3 DAYS Performed at [facility], [address]  [place], [place] [address]    Report Status PENDING  Incomplete  Aerobic/Anaerobic Culture w Gram Stain (surgical/deep wound)     Status: None (Preliminary result)   Collection Time: 05/09/22  9:43 PM   Specimen: Hand, Right; Abscess  Result Value Ref Range Status   Specimen Description ABSCESS  Final   Special Requests RH  Final   Gram Stain   Final    RARE WBC PRESENT, PREDOMINANTLY PMN NO ORGANISMS SEEN Performed at [facility], [place] [address] [place], [place] [address]    Culture   Final    RARE GROUP A STREP (S.PYOGENES) ISOLATED Beta hemolytic streptococci are predictably susceptible to penicillin and other beta lactams. Susceptibility testing not routinely performed. NO ANAEROBES ISOLATED; CULTURE IN PROGRESS FOR 5 DAYS    Report Status PENDING  Incomplete  Acid Fast Smear (AFB)     Status: None   Collection Time: 05/09/22  9:43 PM   Specimen: Hand, Right; Abscess  Result Value Ref Range Status   AFB Specimen Processing Concentration  Final   Acid Fast Smear Negative  Final    Comment: (NOTE) Performed At: [facility] [place], [place] [id number] Rush Farmer MD [id number]    Source (AFB) RH  Final    Comment: Performed at [facility], [place] [address] [place], [place] [address]  Body fluid culture w Gram Stain     Status: None (Preliminary result)   Collection Time: 05/12/22  9:04 AM   Specimen: Synovium; Body Fluid  Result Value Ref Range Status   Specimen Description SYNOVIAL  Final   Special Requests NONE  Final   Gram Stain   Final    MODERATE WBC PRESENT, PREDOMINANTLY PMN NO ORGANISMS SEEN    Culture   Final    NO GROWTH < 24 HOURS Performed at [facility] [address] [place], [place] [address]    Report Status PENDING  Incomplete    Scheduled Meds:  amLODipine  10 mg Oral Daily   atorvastatin  40 mg Oral q1800   chlorhexidine   Topical Daily   heparin  5,000 Units Subcutaneous Q8H   polyethylene  glycol  17 g Oral BID   Continuous Infusions:  cefTRIAXone (ROCEPHIN)  IV Stopped (05/12/22 2234)   linezolid (ZYVOX) IV 600 mg (05/13/22 [id number])    Principal Problem:   Hand abscess Active Problems:   Sepsis due to cellulitis (HCC)   Cellulitis of multiple sites of right hand and fingers   Chronic kidney disease (CKD), stage  IIIb   Essential hypertension   Dyslipidemia   Abscess of finger of right hand   Effusion of right knee joint   LOS: 4 days   A & P  Abscess of finger of right hand S/P I & D of abscess along first metacarpal 05/09/2022. He is receiving ceftriaxone for GAS grown from operative cultures. Will continue wound care as detailed by hand surgery and follow CRP and CBC as recommended by hand surgery. When patient is appropriate for discharge he will need to follow up with hand surgery in 1 week.   Chronic kidney disease (CKD), stage IIIb Creatinine is at baseline.   Dyslipidemia Continue atorvastatin as at home.  Essential hypertension The patient is normotensive on amlodipine and lotensin as at home. Monitor.  Effusion of right knee joint The knee is warm and tender to palpation. X-ray of the knee has demonstrated a significant effusion. Orthopedic surgery has been consulted and has tapped the knee.  Synovial fluid cultures remain negative and no crystals seen with normal uric acid.   Acute kidney injury superimposed on chronic kidney disease ([place]) history of CKD stage IIIb Resolving slowly. Continue to monitor. Avoid nephrotoxins and hypotension. Follow creatinine, electrolytes, and volume status.   Cellulitis of multiple sites of right hand and fingers Operative cultures have grown out strep pyogenes that is sensitive to ceftriaxone. Susceptibilities are pending.  Sepsis due to cellulitis ([facility]) Resolved.  DVT prophylaxis: Heparin Code Status: Full Code Family Communication: Discussed with patient Disposition Plan: tbd   This record has been created  using Systems analyst. Errors have been sought and corrected,but may not always be located. Such creation errors do not reflect on the standard of care.   Lorella Nimrod MD [facility] Direct contact: see [email or web address]  7PM-7AM contact night coverage as above 05/12/2022, 4:06 PM  LOS: 2 days

## 2022-05-13 NOTE — Progress Notes (Signed)
ID PROGRESS NOTE   [name] with right had abscess due to group a streo s/p I x D on 2/13, started to have right knee effusion, underwent arthrocentesis. Cell count showing 11K cells with 87%N, no crystals. Cx negative thus far. He had been on 6 days of IV abtx at time of arthrocentesis  Continue on ceftriaxone and linezolid for the time being. If cultures NGTD at 48hrs, then can discharge on oral abtx. Would check with ortho if feels that he needs washout.  John Gregory [facility] [phone]

## 2022-05-14 NOTE — Plan of Care (Signed)

## 2022-05-14 NOTE — Plan of Care (Signed)
  Problem: Education: Goal: Knowledge of General Education information will improve Description: Including pain rating scale, medication(s)/side effects and non-pharmacologic comfort measures 05/14/2022 0259 by Jule Ser, RN Outcome: Progressing 05/14/2022 0259 by Jule Ser, RN Outcome: Progressing   Problem: Health Behavior/Discharge Planning: Goal: Ability to manage health-related needs will improve 05/14/2022 0259 by Jule Ser, RN Outcome: Progressing 05/14/2022 0259 by Jule Ser, RN Outcome: Progressing   Problem: Clinical Measurements: Goal: Ability to maintain clinical measurements within normal limits will improve 05/14/2022 0259 by Jule Ser, RN Outcome: Progressing 05/14/2022 0259 by Jule Ser, RN Outcome: Progressing Goal: Will remain free from infection 05/14/2022 0259 by Jule Ser, RN Outcome: Progressing 05/14/2022 0259 by Jule Ser, RN Outcome: Progressing Goal: Diagnostic test results will improve 05/14/2022 0259 by Jule Ser, RN Outcome: Progressing 05/14/2022 0259 by Jule Ser, RN Outcome: Progressing Goal: Respiratory complications will improve 05/14/2022 0259 by Jule Ser, RN Outcome: Progressing 05/14/2022 0259 by Jule Ser, RN Outcome: Progressing Goal: Cardiovascular complication will be avoided 05/14/2022 0259 by Jule Ser, RN Outcome: Progressing 05/14/2022 0259 by Jule Ser, RN Outcome: Progressing   Problem: Activity: Goal: Risk for activity intolerance will decrease 05/14/2022 0259 by Jule Ser, RN Outcome: Progressing 05/14/2022 0259 by Jule Ser, RN Outcome: Progressing   Problem: Nutrition: Goal: Adequate nutrition will be maintained 05/14/2022 0259 by Jule Ser, RN Outcome: Progressing 05/14/2022 0259 by Jule Ser, RN Outcome: Progressing   Problem: Coping: Goal: Level of anxiety will decrease 05/14/2022 0259 by Jule Ser, RN Outcome: Progressing 05/14/2022 0259 by Jule Ser, RN Outcome: Progressing   Problem: Elimination: Goal: Will not experience complications related to bowel motility 05/14/2022 0259 by Jule Ser, RN Outcome: Progressing 05/14/2022 0259 by Jule Ser, RN Outcome: Progressing Goal: Will not experience complications related to urinary retention 05/14/2022 0259 by Jule Ser, RN Outcome: Progressing 05/14/2022 0259 by Jule Ser, RN Outcome: Progressing   Problem: Pain Managment: Goal: General experience of comfort will improve 05/14/2022 0259 by Jule Ser, RN Outcome: Progressing 05/14/2022 0259 by Jule Ser, RN Outcome: Progressing   Problem: Safety: Goal: Ability to remain free from injury will improve 05/14/2022 0259 by Jule Ser, RN Outcome: Progressing 05/14/2022 0259 by Jule Ser, RN Outcome: Progressing   Problem: Skin Integrity: Goal: Risk for impaired skin integrity will decrease 05/14/2022 0259 by Jule Ser, RN Outcome: Progressing 05/14/2022 0259 by Jule Ser, RN Outcome: Progressing   Problem: Fluid Volume: Goal: Hemodynamic stability will improve 05/14/2022 0259 by Jule Ser, RN Outcome: Progressing 05/14/2022 0259 by Jule Ser, RN Outcome: Progressing   Problem: Clinical Measurements: Goal: Diagnostic test results will improve 05/14/2022 0259 by Jule Ser, RN Outcome: Progressing 05/14/2022 0259 by Jule Ser, RN Outcome: Progressing Goal: Signs and symptoms of infection will decrease 05/14/2022 0259 by Jule Ser, RN Outcome: Progressing 05/14/2022 0259 by Jule Ser, RN Outcome: Progressing   Problem: Respiratory: Goal: Ability to maintain adequate ventilation will improve 05/14/2022 0259 by Jule Ser, RN Outcome: Progressing 05/14/2022 0259 by Jule Ser, RN Outcome: Progressing

## 2022-05-14 NOTE — Evaluation (Signed)
Physical Therapy Evaluation Patient Details Name: John Gregory MRN: [id number] DOB: November 01, 1965 Today's Date: 05/14/2022  History of Present Illness  Pt is a 57 y.o. male who presented to [facility] 05/07/22 with R hand pain, MRI showing abscess formation, tenosynovitis. Transferred to [facility] 05/09/22 for incisional debridement of R hand. Hospital course complicated by severe R knee pain 2/16, found to have R knee effusion. S/p R knee aspiration and injection 2/16. PMH: CKD stage IIIb, HLD, HTN, OA   Clinical Impression  Pt presents with condition above and deficits mentioned below, see PT Problem List. PTA, he was independent without DME, working, and living with his family in a 1-level house with 2 STE. Currently, pt is primarily limited by pain in his R hand, R knee, and R ankle/foot. He has edema at his R knee, ankle, and foot (worse at the knee), which he reports the edema has improved in his foot and ankle. The pain and edema at his R knee is limiting him from achieving full knee flexion AROM. In addition, his R leg pain impacts his weight acceptance and stability when ambulating. He was able to perform all functional mobility without UE support or physical assistance, but did display balance deficits. He was able to regain his balance though with appropriate reactional strategies. Pt will likely progress well once his swelling and pain are managed, thus do not anticipate follow-up PT needs after d/c. Will continue to follow acutely to maximize pt's return to baseline prior to d/c home.       Recommendations for follow up therapy are one component of a multi-disciplinary discharge planning process, led by the attending physician.  Recommendations may be updated based on patient status, additional functional criteria and insurance authorization.  Follow Up Recommendations No PT follow up      Assistance Recommended at Discharge PRN  Patient can return home with the following  A little help  with bathing/dressing/bathroom;Assistance with cooking/housework;Assist for transportation;Help with stairs or ramp for entrance    Equipment Recommendations None recommended by PT  Recommendations for Other Services       Functional Status Assessment Patient has had a recent decline in their functional status and demonstrates the ability to make significant improvements in function in a reasonable and predictable amount of time.     Precautions / Restrictions Precautions Precautions: Fall (low) Restrictions Weight Bearing Restrictions: Yes RUE Weight Bearing: Non weight bearing Other Position/Activity Restrictions: Per Dr. Ala Bent 05/14/22 for pt's R hand - No ROM restrictions, NWB and no lifting >5 lbs for 2 weeks      Mobility  Bed Mobility Overal bed mobility: Modified Independent             General bed mobility comments: Pt able to perform all bed mobility aspects without assistance, HOB elevated    Transfers Overall transfer level: Needs assistance Equipment used: None Transfers: Sit to/from Stand Sit to Stand: Supervision           General transfer comment: Pt needing cues to remember to not push up through his R hand to transfer to stand as pt initially did use his R hand during first rep. Able to successfully come to stand without UE utilization on 2nd rep. No LOB, supervision for safety    Ambulation/Gait Ambulation/Gait assistance: Supervision Gait Distance (Feet): 200 Feet Assistive device: None Gait Pattern/deviations: Step-through pattern, Decreased stance time - right, Decreased step length - left, Decreased stride length, Narrow base of support, Antalgic, Trunk flexed, Decreased dorsiflexion -  right Gait velocity: reduced Gait velocity interpretation: 1.31 - 2.62 ft/sec, indicative of limited community ambulator   General Gait Details: Pt with slow, mildly unsteady antalgic gait pattern with decreased R stance time due to R leg pain.  Intermittent R foot narrow placement and flexed posture. No LOB needing intervention, able to appropriately maintain his balance with reactional strategies. Supervision for safety. Needed cues to increase R ankle dorsiflexion for improved safety with stepping as pt noted to invert his ankle with swing phase.  Stairs            Wheelchair Mobility    Modified Rankin (Stroke Patients Only)       Balance Overall balance assessment: Mild deficits observed, not formally tested                                           Pertinent Vitals/Pain Pain Assessment Pain Assessment: Faces Faces Pain Scale: Hurts little more Pain Location: R ankle and knee, R hand Pain Descriptors / Indicators: Discomfort, Grimacing, Guarding, Sore Pain Intervention(s): Limited activity within patient's tolerance, Monitored during session, Repositioned, Other (comment) (ROM to tolerance)    Home Living Family/patient expects to be discharged to:: Private residence Living Arrangements: Spouse/significant other;Children Available Help at Discharge: Family;Available 24 hours/day Type of Home: House Home Access: Stairs to enter Entrance Stairs-Rails: Left Entrance Stairs-Number of Steps: 2   Home Layout: One level Home Equipment: None      Prior Function Prior Level of Function : Independent/Modified Independent;Working/employed               ADLs Comments: Works at [employer]        Extremity/Trunk Assessment   Upper Extremity Assessment Upper Extremity Assessment: RUE deficits/detail RUE Deficits / Details: full finger extension/flexion ROM noted except at thumb, which was limited in ROM by ACE wrapping; denied numbness/tingling; mild grip weakness noted but otherwise WFL strength through all other aspects of UE RUE Sensation: WNL RUE Coordination: decreased fine motor;decreased gross motor    Lower Extremity Assessment Lower Extremity Assessment: RLE  deficits/detail RLE Deficits / Details: edema noted at knee and dorsal aspect of ankle/foot proximally and distally (edema worse at knee); limited knee flexion AROM to ~100 degrees, WFL elsewhere; denied numbness/tingling; strength WFL grossly RLE Sensation: WNL RLE Coordination: decreased gross motor    Cervical / Trunk Assessment Cervical / Trunk Assessment: Normal  Communication   Communication: No difficulties  Cognition Arousal/Alertness: Awake/alert Behavior During Therapy: WFL for tasks assessed/performed Overall Cognitive Status: Within Functional Limits for tasks assessed                                          General Comments General comments (skin integrity, edema, etc.): educated pt to ice and elevate his R leg as needed or when at rest, to open and close his R hand often, to elevate his R hand at rest, on his R hand restrictions, and to mobilize frequently while here; he verbalized understanding    Exercises General Exercises - Upper Extremity Digit Composite Flexion: AROM, Right, 5 reps, Seated Composite Extension: AROM, Right, 5 reps, Seated General Exercises - Lower Extremity Ankle Circles/Pumps: AAROM, Right, 20 reps, Supine Heel Slides: AAROM, Right, Other reps (comment), Supine (x3)   Assessment/Plan  PT Assessment Patient needs continued PT services  PT Problem List Decreased range of motion;Decreased activity tolerance;Decreased balance;Decreased mobility;Decreased coordination;Pain       PT Treatment Interventions DME instruction;Gait training;Stair training;Functional mobility training;Therapeutic activities;Therapeutic exercise;Balance training;Neuromuscular re-education;Patient/family education    PT Goals (Current goals can be found in the Care Plan section)  Acute Rehab PT Goals Patient Stated Goal: to reduce pain and swelling PT Goal Formulation: With patient Time For Goal Achievement: 05/28/22 Potential to Achieve Goals:  Good    Frequency Min 3X/week     Co-evaluation               AM-PAC PT "6 Clicks" Mobility  Outcome Measure Help needed turning from your back to your side while in a flat bed without using bedrails?: None Help needed moving from lying on your back to sitting on the side of a flat bed without using bedrails?: None Help needed moving to and from a bed to a chair (including a wheelchair)?: A Little Help needed standing up from a chair using your arms (e.g., wheelchair or bedside chair)?: A Little Help needed to walk in hospital room?: A Little Help needed climbing 3-5 steps with a railing? : A Little 6 Click Score: 20    End of Session Equipment Utilized During Treatment: Gait belt Activity Tolerance: Patient tolerated treatment well Patient left: in bed;with call bell/phone within reach Nurse Communication:  (RN reporting pt was permitted to mobilize on his own prior to PT Eval) PT Visit Diagnosis: Unsteadiness on feet (R26.81);Other abnormalities of gait and mobility (R26.89);Difficulty in walking, not elsewhere classified (R26.2);Pain Pain - Right/Left: Right Pain - part of body: Hand;Knee;Ankle and joints of foot    Time: [id number] PT Time Calculation (min) (ACUTE ONLY): 19 min   Charges:   PT Evaluation $PT Eval Low Complexity: 1 Low          Moishe Spice, PT, DPT Acute Rehabilitation Services  Office: [phone]   Orvan Falconer 05/14/2022, 1:41 PM

## 2022-05-14 NOTE — Progress Notes (Signed)
PROGRESS NOTE  John Gregory [id number] DOB: 11/07/1965 DOA: 05/09/2022 PCP: Letta Median, MD  Brief History   57 year old with past medical history significant for hypertension, CKD stage IIIb, osteoarthritis, dyslipidemia, or urolithiasis, polycystic kidney disease presents to the ED with acute onset of right hand pain worsening 3 days prior to admission, developed swelling erythema tenderness, he does not recall any injury.  He does work in  [employer] and he does sustain some injury intermittently, does not recall any recent trauma.  He was admitted at [facility] and started on IV antibiotics with no improvement. MRI show abscess formation, tenosynovitis.  Transferred to [facility] for hand surgery evaluation.   The patient was admitted. He was placed on linezolid, cefepime, and metronidazole. The patient was evaluated by hand surgery and underwent I & D of abscess along the first metacarpal on 05/09/2022. Gram stain of surgical specimens has not revealed any organisms and cultures have had no growth. Recommendation from ID is to continue current broad spectrum antibiotics for now and to narrow antibiotics when possible.   On 05/12/2022 the patient complained of right knee pain. X-ray of the joint revealed a significant joint effusion. Orthopedic surgery was consulted and the joint was tapped. Results pending.  2/17: Blood pressure mildly elevated.  Preliminary synovial fluid cultures negative.  No crystals seen.  Uric acid normal.  Hand abscess cultures with group A strep.  Patient is on ceftriaxone.  2/18: Hemodynamically stable.  ID would like him to continue with IV antibiotics for another 24 hours.  Continue to have right knee pain, synovial fluid cultures remain negative.  Crystals negative. If continue to have significant right knee pain, we will discuss with orthopedic surgery regarding getting a washout.  Consultants  Infectious Disease Hand Surgery  Procedures  I &  D of hand 05/09/2022  Antibiotics  Linezolid Cefepime Metronidazole Currently on ceftriaxone  Subjective  Patient was seen and examined today.  Continued to have right knee pain.  He was very concerned about gout despite reassurance that there was no crystals in synovial fluid.  Hand pain improving.  Objective   Vitals:  Vitals:   05/14/22 1052 05/14/22 1733  BP: (!) 138/92 (!) 147/89  Pulse: 65 65  Resp: 15 17  Temp: 98.7 F (37.1 C) 98.2 F (36.8 C)  SpO2: 99% 97%    Exam:  General.     In no acute distress. Pulmonary.  Lungs clear bilaterally, normal respiratory effort. CV.  Regular rate and rhythm, no JVD, rub or murmur. Abdomen.  Soft, nontender, nondistended, BS positive. CNS.  Alert and oriented .  No focal neurologic deficit. Extremities.  Right hand with Ace wrap and bandage, right knee with mild effusion. Psychiatry.  Judgment and insight appears normal.   I have personally reviewed the following:   Today's Data   Vitals:   05/14/22 1052 05/14/22 1733  BP: (!) 138/92 (!) 147/89  Pulse: 65 65  Resp: 15 17  Temp: 98.7 F (37.1 C) 98.2 F (36.8 C)  SpO2: 99% 97%     Lab Data      Latest Ref Rng & Units 05/13/2022    3:54 AM 05/12/2022    6:13 AM 05/11/2022    5:45 AM  CBC  WBC 4.0 - 10.5 K/uL 14.3  9.8  11.8   Hemoglobin 13.0 - 17.0 g/dL 12.3  12.3  10.6   Hematocrit 39.0 - 52.0 % 37.2  37.0  32.2   Platelets 150 - 400  K/uL 328  295  235       Latest Ref Rng & Units 05/13/2022    3:54 AM 05/12/2022    6:13 AM 05/11/2022    5:45 AM  BMP  Glucose 70 - 99 mg/dL 122  105  92   BUN 6 - 20 mg/dL 14  13  15   $ Creatinine 0.61 - 1.24 mg/dL 2.13  2.07  2.17   Sodium 135 - 145 mmol/L 131  135  135   Potassium 3.5 - 5.1 mmol/L 4.7  4.0  4.4   Chloride 98 - 111 mmol/L 98  100  108   CO2 22 - 32 mmol/L 21  20  18   $ Calcium 8.9 - 10.3 mg/dL 8.9  8.9  8.1      Micro Data   Results for orders placed or performed during the hospital encounter of  05/09/22  Culture, fungus without smear     Status: None (Preliminary result)   Collection Time: 05/09/22  9:43 PM   Specimen: Hand, Right; Abscess  Result Value Ref Range Status   Specimen Description ABSCESS  Final   Special Requests HAND,RIGHT  Final   Culture   Final    NO FUNGUS ISOLATED AFTER 3 DAYS Performed at [facility], [address] [place], [place] [address]    Report Status PENDING  Incomplete  Aerobic/Anaerobic Culture w Gram Stain (surgical/deep wound)     Status: None (Preliminary result)   Collection Time: 05/09/22  9:43 PM   Specimen: Hand, Right; Abscess  Result Value Ref Range Status   Specimen Description ABSCESS  Final   Special Requests RH  Final   Gram Stain   Final    RARE WBC PRESENT, PREDOMINANTLY PMN NO ORGANISMS SEEN Performed at [facility], [place] [address] [place], [place] [address]    Culture   Final    RARE GROUP A STREP (S.PYOGENES) ISOLATED Beta hemolytic streptococci are predictably susceptible to penicillin and other beta lactams. Susceptibility testing not routinely performed. NO ANAEROBES ISOLATED; CULTURE IN PROGRESS FOR 5 DAYS    Report Status PENDING  Incomplete  Acid Fast Smear (AFB)     Status: None   Collection Time: 05/09/22  9:43 PM   Specimen: Hand, Right; Abscess  Result Value Ref Range Status   AFB Specimen Processing Concentration  Final   Acid Fast Smear Negative  Final    Comment: (NOTE) Performed At: [facility] [place], [place] [id number] Rush Farmer MD [id number]    Source (AFB) RH  Final    Comment: Performed at [facility], [place] [address], [place], [place] [address]  Body fluid culture w Gram Stain     Status: None (Preliminary result)   Collection Time: 05/12/22  9:04 AM   Specimen: Synovium; Body Fluid  Result Value Ref Range Status   Specimen Description SYNOVIAL  Final   Special Requests NONE  Final   Gram Stain   Final    MODERATE WBC PRESENT,  PREDOMINANTLY PMN NO ORGANISMS SEEN    Culture   Final    NO GROWTH 2 DAYS Performed at [facility], [address], [place], [place] [address]    Report Status PENDING  Incomplete    Scheduled Meds:  amLODipine  10 mg Oral Daily   atorvastatin  40 mg Oral q1800   chlorhexidine   Topical Daily   heparin  5,000 Units Subcutaneous Q8H  polyethylene glycol  17 g Oral BID   Continuous Infusions:  cefTRIAXone (ROCEPHIN)  IV Stopped (05/13/22 2221)   linezolid (ZYVOX) IV 600 mg (05/14/22 1000)    Principal Problem:   Hand abscess Active Problems:   Sepsis due to cellulitis (HCC)   Cellulitis of multiple sites of right hand and fingers   Chronic kidney disease (CKD), stage IIIb   Essential hypertension   Dyslipidemia   Abscess of finger of right hand   Effusion of right knee joint   LOS: 5 days   A & P  Abscess of finger of right hand S/P I & D of abscess along first metacarpal 05/09/2022. He is receiving ceftriaxone for GAS grown from operative cultures. Will continue wound care as detailed by hand surgery and follow CRP and CBC as recommended by hand surgery. When patient is appropriate for discharge he will need to follow up with hand surgery in 1 week.  -Continue IV antibiotics for 1 more day as recommended by ID  Chronic kidney disease (CKD), stage IIIb Creatinine is at baseline.   Dyslipidemia Continue atorvastatin as at home.  Essential hypertension The patient is normotensive on amlodipine and lotensin as at home. Monitor.  Effusion of right knee joint The knee is warm and tender to palpation. X-ray of the knee has demonstrated a significant effusion. Orthopedic surgery has been consulted and has tapped the knee.  Synovial fluid cultures remain negative and no crystals seen with normal uric acid. -If no improvement by tomorrow, we will ask orthopedic to revisit.  Acute kidney injury superimposed on chronic kidney disease ([place]) history of CKD stage  IIIb Resolving slowly. Continue to monitor. Avoid nephrotoxins and hypotension. Follow creatinine, electrolytes, and volume status.   Cellulitis of multiple sites of right hand and fingers Operative cultures have grown out strep pyogenes that is sensitive to ceftriaxone. Susceptibilities are pending.  Sepsis due to cellulitis ([place]) Resolved.  DVT prophylaxis: Heparin Code Status: Full Code Family Communication: Discussed with patient Disposition Plan: tbd   This record has been created using Systems analyst. Errors have been sought and corrected,but may not always be located. Such creation errors do not reflect on the standard of care.   Lorella Nimrod MD [facility] Direct contact: see [email or web address]  7PM-7AM contact night coverage as above 05/12/2022, 4:06 PM  LOS: 2 days

## 2022-05-14 NOTE — Progress Notes (Addendum)
[facility] for Infectious Disease    Date of Admission:  05/09/2022   Total days of antibiotics 7/ceftriaxone and linezolid           ID: John Gregory is a 57 y.o. male with   Principal Problem:   Hand abscess Active Problems:   Chronic kidney disease (CKD), stage IIIb   Sepsis due to cellulitis [facility])   Essential hypertension   Dyslipidemia   Cellulitis of multiple sites of right hand and fingers   Abscess of finger of right hand   Effusion of right knee joint    S: afebrile right hand feels better but still having pain to left knee  Medications:   amLODipine  10 mg Oral Daily   atorvastatin  40 mg Oral q1800   chlorhexidine   Topical Daily   heparin  5,000 Units Subcutaneous Q8H   polyethylene glycol  17 g Oral BID    Objective: Vital signs in last 24 hours: Temp:  [98.1 F (36.7 C)-98.7 F (37.1 C)] 98.5 F (36.9 C) (02/18 0527) Pulse Rate:  [60-67] 67 (02/18 0527) Resp:  [18] 18 (02/18 0527) BP: (128-150)/(78-82) 128/78 (02/18 0527) SpO2:  [97 %-100 %] 98 % (02/18 0527)  Physical Exam  Constitutional: He is oriented to person, place, and time. He appears well-developed and well-nourished. No distress.  HENT:  Mouth/Throat: Oropharynx is clear and moist. No oropharyngeal exudate.  Cardiovascular: Normal rate, regular rhythm and normal heart sounds. Exam reveals no gallop and no friction rub.  No murmur heard.  Pulmonary/Chest: Effort normal and breath sounds normal. No respiratory distress. He has no wheezes.  [id number] hand wrapped, but right knee still has effusion Neurological: He is alert and oriented to person, place, and time.  Skin: Skin is warm and dry. No rash noted. No erythema.  Psychiatric: He has a normal mood and affect. His behavior is normal.    Lab Results Recent Labs    05/12/22 0613 05/13/22 0354  WBC 9.8 14.3*  HGB 12.3* 12.3*  HCT 37.0* 37.2*  NA 135 131*  K 4.0 4.7  CL 100 98  CO2 20* 21*  BUN 13 14  CREATININE  2.07* 2.13*    Sedimentation Rate Recent Labs    05/11/22 1921  ESRSEDRATE 97*   C-Reactive Protein Recent Labs    05/11/22 1921  CRP 6.7*    Microbiology:  RARE GROUP A STREP (S.PYOGENES) ISOLATED   Studies/Results: DG Ankle 2 Views Right  Result Date: 05/12/2022 CLINICAL DATA:  Swelling EXAM: RIGHT ANKLE - 2 VIEW COMPARISON:  Right foot x-ray 06/05/2015 FINDINGS: There is no evidence of fracture, dislocation, or joint effusion. Mild midfoot arthropathy. Soft tissue swelling is noted over the dorsum of the foot. IMPRESSION: Soft tissue swelling over the dorsum of the foot. No acute osseous abnormality. Electronically Signed   By: Davina Poke D.O.   On: 05/12/2022 09:30     Assessment/Plan: Hand abscess s/p I x D = cultures growing group b strep. Continue on ceftriaxone for the time being, can change to cephalexin on discharge. Continue on with wound care  Right knee effusion =showed 11K with 87N but 2/16 NGTD on day 2. Still concern that this is maybe septic arthritis. Had 5 days of abtx prior to arthrocentesis. Not appreciably improved since steroid injection. May need to discuss with ortho any utility for arthroscopic washout.  Leukocytosis = continue to monitor maybe due to process from right knee  [facility] for  Infectious Diseases Pager: [phone]  05/14/2022, 8:58 AM

## 2022-05-15 LAB — CBC WITH DIFFERENTIAL/PLATELET
Abs Immature Granulocytes: 0.31 10*3/uL — ABNORMAL HIGH (ref 0.00–0.07)
Basophils Absolute: 0 10*3/uL (ref 0.0–0.1)
Basophils Relative: 0 %
Eosinophils Absolute: 0.1 10*3/uL (ref 0.0–0.5)
Eosinophils Relative: 1 %
HCT: 34.7 % — ABNORMAL LOW (ref 39.0–52.0)
Hemoglobin: 11.5 g/dL — ABNORMAL LOW (ref 13.0–17.0)
Immature Granulocytes: 3 %
Lymphocytes Relative: 16 %
Lymphs Abs: 1.8 10*3/uL (ref 0.7–4.0)
MCH: 28.3 pg (ref 26.0–34.0)
MCHC: 33.1 g/dL (ref 30.0–36.0)
MCV: 85.3 fL (ref 80.0–100.0)
Monocytes Absolute: 0.7 10*3/uL (ref 0.1–1.0)
Monocytes Relative: 6 %
Neutro Abs: 8.5 10*3/uL — ABNORMAL HIGH (ref 1.7–7.7)
Neutrophils Relative %: 74 %
Platelets: 444 10*3/uL — ABNORMAL HIGH (ref 150–400)
RBC: 4.07 MIL/uL — ABNORMAL LOW (ref 4.22–5.81)
RDW: 13.2 % (ref 11.5–15.5)
WBC: 11.4 10*3/uL — ABNORMAL HIGH (ref 4.0–10.5)
nRBC: 0 % (ref 0.0–0.2)

## 2022-05-15 LAB — BODY FLUID CULTURE W GRAM STAIN: Culture: NO GROWTH

## 2022-05-15 LAB — C-REACTIVE PROTEIN: CRP: 7.6 mg/dL — ABNORMAL HIGH (ref <1.0)

## 2022-05-15 LAB — AEROBIC/ANAEROBIC CULTURE W GRAM STAIN (SURGICAL/DEEP WOUND)

## 2022-05-15 MED ORDER — ONDANSETRON HCL 4 MG PO TABS: 4.0000 mg | ORAL_TABLET | Freq: Four times a day (QID) | ORAL | 0 refills | Status: AC | PRN

## 2022-05-15 MED ORDER — EPINEPHRINE 0.3 MG/0.3ML IJ SOAJ
0.3000 mg | Freq: Once | INTRAMUSCULAR | Status: DC | PRN
  Filled 2022-05-15: qty 0.3

## 2022-05-15 MED ORDER — CEPHALEXIN 500 MG PO CAPS: 500.0000 mg | ORAL_CAPSULE | Freq: Four times a day (QID) | ORAL | 0 refills | Status: AC

## 2022-05-15 MED ORDER — DIPHENHYDRAMINE HCL 50 MG/ML IJ SOLN
25.0000 mg | Freq: Once | INTRAMUSCULAR | Status: DC | PRN
  Filled 2022-05-15: qty 1

## 2022-05-15 MED ORDER — OXYCODONE HCL 5 MG PO TABS: 5.0000 mg | ORAL_TABLET | ORAL | 0 refills | Status: AC | PRN

## 2022-05-15 MED ORDER — POLYETHYLENE GLYCOL 3350 17 G PO PACK: 17.0000 g | PACK | Freq: Two times a day (BID) | ORAL | 0 refills | Status: AC

## 2022-05-15 MED ORDER — CEPHALEXIN 500 MG PO CAPS
500.0000 mg | ORAL_CAPSULE | Freq: Once | ORAL | Status: AC
  Administered 2022-05-15: 500 mg via ORAL
  Filled 2022-05-15: qty 1

## 2022-05-15 MED ORDER — CEPHALEXIN 500 MG PO CAPS: 500.0000 mg | ORAL_CAPSULE | Freq: Four times a day (QID) | ORAL | Status: DC

## 2022-05-15 MED ORDER — ACETAMINOPHEN 325 MG PO TABS: 650.0000 mg | ORAL_TABLET | Freq: Four times a day (QID) | ORAL | 0 refills | Status: AC | PRN

## 2022-05-15 NOTE — TOC Transition Note (Signed)
Transition of Care [facility]) - CM/SW Discharge Note   Patient Details  Name: John Gregory MRN: [id number] Date of Birth: 12/06/1965  Transition of Care [facility] CM/SW Contact:  Curlene Labrum, RN Phone Number: 05/15/2022, 2:11 PM   Clinical Narrative:    CM met with the patient at the bedside and the patient states that he does not want to have home health RN and would prefer to perform own wound care to right hand at home.  Attending MD was notified.  Bedside nursing to provide wound care instructions to the patient prior to discharge and provide dressing supplies for home.  I called Dr. Iva Lento office and follow up was scheduled for Thursday, 05/25/2022 at 1:30.  The office will check with the physician nurse at the office and schedule the patient for an earlier appointment when available.  I spoke with the patient and he plans to arrange transportation to home after 4 pm when family/friend are available.  The patient will be discharged to the discharge lounge.   Final next level of care: Home/Self Care Barriers to Discharge: No Barriers Identified   Patient Goals and CMS Choice      Discharge Placement                         Discharge Plan and Services Additional resources added to the After Visit Summary for     Discharge Planning Services: CM Consult                                 Social Determinants of Health (SDOH) Interventions SDOH Screenings   Food Insecurity: No Food Insecurity (05/09/2022)  Housing: Low Risk  (05/09/2022)  Transportation Needs: No Transportation Needs (05/09/2022)  Utilities: Not At Risk (05/09/2022)  Tobacco Use: High Risk (05/10/2022)     Readmission Risk Interventions    05/15/2022    2:11 PM  Readmission Risk Prevention Plan  Post Dischage Appt Complete  Medication Screening Complete  Transportation Screening Complete

## 2022-05-15 NOTE — Progress Notes (Signed)
Dressing teaching completed with pt on right hand. Pt states that he will be the one doing it at home so he wants to learn now. Pt voices full understanding. Dressing supplies provided for pt to go home with.

## 2022-05-15 NOTE — Progress Notes (Signed)
Mobility Specialist - Progress Note   05/15/22 1119  Mobility  Activity Ambulated with assistance in hallway  Level of Assistance Standby assist, set-up cues, supervision of patient - no hands on  Assistive Device None  Distance Ambulated (ft) 300 ft  Activity Response Tolerated well  Mobility Referral Yes  $Mobility charge 1 Mobility   Pt was received in bed and agreeable to mobility. Pt c/o pain at RLE/RUE during session. Pt was returned to bed with all needs met.   John Gregory  Mobility Specialist Please contact via Solicitor or Rehab office at [phone]

## 2022-05-15 NOTE — Progress Notes (Signed)
[facility]  Date of Admission:  05/09/2022           Reason for visit: Follow up on right hand abscess  Current antibiotics: Ceftriaxone Linezolid  ASSESSMENT:    57 y.o. male admitted with:  Right hand abscess: Status post I&D 05/09/2022.  Operative cultures with group A strep. Right knee effusion: Status post arthrocentesis with 11,000 WBC (87% PMNs), no crystals, and cultures no growth.  Arhtrocentesis done after ~5 days of antibiotics. Seen by orthopedic surgery who does not recommend surgical intervention. CKD stage III: Creatinine at baseline History of penicillin allergy: Unclear what the reaction was.  He reports swelling.  RECOMMENDATIONS:    Will attempt cephalexin test dose to ensure he tolerates given his unclear history Still concern regarding possibility of right knee septic arthritis as he had 5 days of antibiotics prior to the arthrocentesis that was done.  However, if orthopedics does not feel that surgical intervention is indicated or that suspicion is low for septic joint then would plan for 2 weeks of antibiotics from the date of his hand I&D If he tolerates cephalexin, then would send out on that antibiotic.  If he does not tolerate cephalexin, then would do linezolid End date for antibiotics = 05/23/2022 Will sign off for now, please call as needed   Principal Problem:   Hand abscess Active Problems:   Chronic kidney disease (CKD), stage IIIb   Sepsis due to cellulitis [facility])   Essential hypertension   Dyslipidemia   Cellulitis of multiple sites of right hand and fingers   Abscess of finger of right hand   Effusion of right knee joint    MEDICATIONS:    Scheduled Meds:  amLODipine  10 mg Oral Daily   atorvastatin  40 mg Oral q1800   chlorhexidine   Topical Daily   heparin  5,000 Units Subcutaneous Q8H   polyethylene glycol  17 g Oral BID   Continuous Infusions:  cefTRIAXone (ROCEPHIN)  IV 2 g (05/14/22 2155)    linezolid (ZYVOX) IV 600 mg (05/15/22 0822)   PRN Meds:.acetaminophen **OR** acetaminophen, diphenhydrAMINE, EPINEPHrine, hydrALAZINE, lip balm, Muscle Rub, ondansetron **OR** ondansetron (ZOFRAN) IV, oxyCODONE  SUBJECTIVE:   24 hour events:  No acute events noted Patient with no new complaints.  He reports his hand is "still there".  He reports the same with his right knee. He thinks he had a reaction of swelling to penicillin and also Keflex but he is not sure.   Review of Systems  All other systems reviewed and are negative.     OBJECTIVE:   Blood pressure (!) 143/91, pulse 63, temperature 98.6 F (37 C), temperature source Oral, resp. rate 16, SpO2 99 %. There is no height or weight on file to calculate BMI.  Physical Exam Constitutional:      Appearance: Normal appearance.  HENT:     Head: Normocephalic and atraumatic.  Eyes:     Extraocular Movements: Extraocular movements intact.     Conjunctiva/sclera: Conjunctivae normal.  Pulmonary:     Effort: Pulmonary effort is normal. No respiratory distress.  Abdominal:     General: There is no distension.     Palpations: Abdomen is soft.  Musculoskeletal:     Cervical back: Normal range of motion and neck supple.     Comments: His right knee has some increased swelling but he has good range of motion with no significant pain or tenderness.  His right hand is  wrapped.  Skin:    General: Skin is warm and dry.  Neurological:     General: No focal deficit present.     Mental Status: He is alert and oriented to person, place, and time.  Psychiatric:        Mood and Affect: Mood normal.        Behavior: Behavior normal.      Lab Results: Lab Results  Component Value Date   WBC 11.4 (H) 05/15/2022   HGB 11.5 (L) 05/15/2022   HCT 34.7 (L) 05/15/2022   MCV 85.3 05/15/2022   PLT 444 (H) 05/15/2022    Lab Results  Component Value Date   NA 131 (L) 05/13/2022   K 4.7 05/13/2022   CO2 21 (L) 05/13/2022   GLUCOSE 122  (H) 05/13/2022   BUN 14 05/13/2022   CREATININE 2.13 (H) 05/13/2022   CALCIUM 8.9 05/13/2022   GFRNONAA 36 (L) 05/13/2022   GFRAA 36 (L) 08/01/2019    Lab Results  Component Value Date   ALT 20 05/07/2022   AST 22 05/07/2022   ALKPHOS 86 05/07/2022   BILITOT 1.6 (H) 05/07/2022       Component Value Date/Time   CRP 7.6 (H) 05/15/2022 0921       Component Value Date/Time   ESRSEDRATE 97 (H) 05/11/2022 1921     I have reviewed the micro and lab results in Epic.  Imaging: No results found.   Imaging independently reviewed in Epic.    [facility] [phone] pager 05/15/2022, 12:41 PM  I have personally spent 50 minutes involved in face-to-face and non-face-to-face activities for this patient on the day of the visit. Professional time spent includes the following activities: Preparing to see the patient (review of tests), Obtaining and/or reviewing separately obtained history (admission/discharge record), Performing a medically appropriate examination and/or evaluation , Ordering medications/tests/procedures, referring and communicating with other health care professionals, Documenting clinical information in the EMR, Independently interpreting results (not separately reported), Communicating results to the patient/family/caregiver, Counseling and educating the patient/family/caregiver and Care coordination (not separately reported).

## 2022-05-15 NOTE — Progress Notes (Signed)
Keflex administered per order at this time. PRN Benadryl and Epi-Pen at bedside along with this nurse. VS Q20mn x1hour per pharmacy. See Flowsheets.

## 2022-05-15 NOTE — Progress Notes (Incomplete)
Keflex

## 2022-05-15 NOTE — Discharge Summary (Addendum)
Physician Discharge Summary   Patient: John Gregory MRN: [id number] DOB: 01-Feb-1966  Admit date:     05/09/2022  Discharge date: 05/15/22  Discharge Physician: Lorella Nimrod   PCP: Letta Median, MD   Recommendations at discharge:  Please obtain CBC and BMP in 1 week Follow-up with orthopedic/hand surgery in 1 week Please ensure that he is having a healing wound. Follow-up with primary care provider  Discharge Diagnoses: Principal Problem:   Hand abscess Active Problems:   Sepsis due to cellulitis ([place])   Cellulitis of multiple sites of right hand and fingers   Chronic kidney disease (CKD), stage IIIb   Essential hypertension   Dyslipidemia   Abscess of finger of right hand   Effusion of right knee joint   Hospital Course: 57 year old with past medical history significant for hypertension, CKD stage IIIb, osteoarthritis, dyslipidemia, or urolithiasis, polycystic kidney disease presents to the ED with acute onset of right hand pain worsening 3 days prior to admission, developed swelling erythema tenderness, he does not recall any injury.  He does work in  [employer] and he does sustain some injury intermittently, does not recall any recent trauma.  He was admitted at [facility] and started on IV antibiotics with no improvement. MRI show abscess formation, tenosynovitis.  Transferred to [facility] for hand surgery evaluation.   The patient was admitted. He was placed on linezolid, cefepime, and metronidazole. The patient was evaluated by hand surgery and underwent I & D of abscess along the first metacarpal on 05/09/2022. Gram stain of surgical specimens has not revealed any organisms and cultures have had no growth. Recommendation from ID is to continue current broad spectrum antibiotics for now and to narrow antibiotics when possible.   On 05/12/2022 the patient complained of right knee pain. X-ray of the joint revealed a significant joint effusion. Orthopedic surgery was  consulted and the joint was tapped. Results pending.  2/17: Blood pressure mildly elevated.  Preliminary synovial fluid cultures negative.  No crystals seen.  Uric acid normal.  Hand abscess cultures with group A strep.  Patient is on ceftriaxone.  2/18: Hemodynamically stable.  ID would like him to continue with IV antibiotics for another 24 hours.  Continue to have right knee pain, synovial fluid cultures remain negative.  Crystals negative. If continue to have significant right knee pain, we will discuss with orthopedic surgery regarding getting a washout.  2/19: Hemodynamically stable.  Synovial fluid cultures remain negative.  Infectious disease switched him to Keflex to complete a total of 2 weeks course after the procedure, he will take antibiotics until 05/23/2022.  Discussed with orthopedic surgery and they are not recommending any washout, they will follow-up with him as outpatient in 1 week. Patient need to soak his hand with antibacterial soap and change dressing daily until he sees his orthopedic surgeon for further recommendations.  He continued to have right knee pain but edema and effusion has been improved.  Per patient he has an history of arthritis before.  He was also given oxycodone to be used only as needed for severe pain.  Patient will continue the rest of his home medications and need to have a close follow-up with his providers for further recommendations.  Assessment and Plan: * Hand abscess The patient has undergone I & D with hand surgery. He is receiving ceftriaxone for GAS grown from operative cultures. Will continue wound care as detailed by hand surgery and follow CRP and CBC as recommended by hand surgery.  When patient is appropriate for discharge he will need to follow up with hand surgery in 1 week.  Sepsis due to cellulitis ([place]) Resolved.  Cellulitis of multiple sites of right hand and fingers Operative cultures have grown out strep pyogenes that is  sensitive to ceftriaxone. Susceptibilities are pending.  Acute kidney injury superimposed on chronic kidney disease (HCC)-resolved as of 05/12/2022 Resolving slowly. Creatinine is 2.07 today. Continue to monitor. Avoid nephrotoxins and hypotension. Follow creatinine, electrolytes, and volume status.   Effusion of right knee joint The knee is warm and tender to palpation. X-ray of the knee has demonstrated a significant effusion. Orthopedic surgery has been consulted and has tapped the knee. Awaiting result of fluid. The patient's oxycodone was increased to 5-10 mg and dilaudid was discontinued.  Abscess of finger of right hand S/P I & D of abscess along first metacarpal 05/09/2022. He is receiving ceftriaxone for GAS grown from operative cultures. Will continue wound care as detailed by hand surgery and follow CRP and CBC as recommended by hand surgery. When patient is appropriate for discharge he will need to follow up with hand surgery in 1 week.   Dyslipidemia Continue atorvastatin as at home.  Essential hypertension The patient is normotensive on amlodipine and lotensin as at home. Monitor.  Chronic kidney disease (CKD), stage IIIb Creatinine is at baseline. 2.07 today.    Pain control - [employer] Controlled Substance Reporting System database was reviewed. and patient was instructed, not to drive, operate heavy machinery, perform activities at heights, swimming or participation in water activities or provide baby-sitting services while on Pain, Sleep and Anxiety Medications; until their outpatient Physician has advised to do so again. Also recommended to not to take more than prescribed Pain, Sleep and Anxiety Medications.  Consultants: Hand surgery, infectious disease Procedures performed: Incision and drainage of right hand abscess Disposition: Home Diet recommendation:  Discharge Diet Orders (From admission, onward)     Start     Ordered   05/15/22 0000  Diet - low sodium  heart healthy        05/15/22 1314           Cardiac and Carb modified diet DISCHARGE MEDICATION: Allergies as of 05/15/2022       Reactions   Bactrim [sulfamethoxazole-trimethoprim] Itching   Calcium Other (See Comments)   Kidney stones   Penicillins Swelling   angioedema        Medication List     TAKE these medications    acetaminophen 325 MG tablet Commonly known as: TYLENOL Take 2 tablets (650 mg total) by mouth every 6 (six) hours as needed for mild pain (or Fever >/= 101).   amLODipine 10 MG tablet Commonly known as: NORVASC Take 1 tablet (10 mg total) by mouth daily.   aspirin EC 81 MG tablet Take 1 tablet (81 mg total) by mouth daily. Swallow whole.   atorvastatin 40 MG tablet Commonly known as: LIPITOR Take 1 tablet (40 mg total) by mouth daily at 6 PM.   benazepril-hydrochlorthiazide 20-12.5 MG tablet Commonly known as: LOTENSIN HCT Take 1 tablet by mouth daily.   cephALEXin 500 MG capsule Commonly known as: KEFLEX Take 1 capsule (500 mg total) by mouth 4 (four) times daily for 8 days.   ondansetron 4 MG tablet Commonly known as: ZOFRAN Take 1 tablet (4 mg total) by mouth every 6 (six) hours as needed for nausea.   oxyCODONE 5 MG immediate release tablet Commonly known as: Oxy IR/ROXICODONE Take 1-2  tablets (5-10 mg total) by mouth every 4 (four) hours as needed for moderate pain.   polyethylene glycol 17 g packet Commonly known as: MIRALAX / GLYCOLAX Take 17 g by mouth 2 (two) times daily.        Follow-up Information     Bender, Durene Cal, MD. Schedule an appointment as soon as possible for a visit in 1 week(s).   Specialty: Family Medicine Contact information: [place] [address] [phone]         Ellard Artis, MD. Go on 05/25/2022.   Specialties: Orthopedic Surgery, Hand Surgery Why: You are scheduled for a hospital follow up with the hand surgeon on Febrary 29, 2024 at 1:30  pm.  They will call you for a sooner follow up. Contact information: [place] [address] [phone]                Discharge Exam: There were no vitals filed for this visit. General.     In no acute distress. Pulmonary.  Lungs clear bilaterally, normal respiratory effort. CV.  Regular rate and rhythm, no JVD, rub or murmur. Abdomen.  Soft, nontender, nondistended, BS positive. CNS.  Alert and oriented .  No focal neurologic deficit. Extremities.  Right hand with clean bandage, no lower extremity edema, right knee with much improved edema and effusion. Psychiatry.  Judgment and insight appears normal.   Condition at discharge: stable  The results of significant diagnostics from this hospitalization (including imaging, microbiology, ancillary and laboratory) are listed below for reference.   Imaging Studies: DG Ankle 2 Views Right  Result Date: 05/12/2022 CLINICAL DATA:  Swelling EXAM: RIGHT ANKLE - 2 VIEW COMPARISON:  Right foot x-ray 06/05/2015 FINDINGS: There is no evidence of fracture, dislocation, or joint effusion. Mild midfoot arthropathy. Soft tissue swelling is noted over the dorsum of the foot. IMPRESSION: Soft tissue swelling over the dorsum of the foot. No acute osseous abnormality. Electronically Signed   By: Davina Poke D.O.   On: 05/12/2022 09:30   DG Knee Complete 4 Views Right  Result Date: 05/12/2022 CLINICAL DATA:  Knee pain EXAM: RIGHT KNEE - COMPLETE 4+ VIEW COMPARISON:  None Available. FINDINGS: Degenerative changes in the right knee with lateral greater than medial compartment narrowing and tricompartment osteophyte formation. There is a moderate right knee effusion. Bipartite patella. No evidence of acute fracture or dislocation. Soft tissues are unremarkable. IMPRESSION: Moderate degenerative changes in the right knee. Bipartite patella. Moderate effusion. No acute displaced fractures identified. Electronically Signed   By: Lucienne Capers M.D.   On: 05/12/2022 00:21   MR HAND RIGHT W WO CONTRAST  Result Date: 05/09/2022 CLINICAL DATA:  Hand cellulitis EXAM: MRI OF THE RIGHT HAND WITHOUT AND WITH CONTRAST TECHNIQUE: Multiplanar, multisequence MR imaging of the right hand was performed before and after the administration of intravenous contrast. CONTRAST:  59m GADAVIST GADOBUTROL 1 MMOL/ML IV SOLN COMPARISON:  CT 05/07/2022, radiograph 05/07/2022 FINDINGS: Bones/Joint/Cartilage There is no evidence of acute fracture. There is mild to moderate osteoarthritis throughout the hand and wrist with associated mild subchondral marrow edema and cystic change most prominent at the base of thumb and thumb MCP joint. There is no significant marrow signal alteration suggesting osteomyelitis. There is a reactive DRUJ effusion. Ligaments Intact second through fifth MCP collateral ligaments. Suboptimal positioning to accurately evaluate the thumb collateral ligaments. Muscles and Tendons No acute tendon tear or significant tenosynovitis. There is mild reactive intramuscular edema along the radial  aspect of the hand. No intramuscular fluid collection. Soft tissues There is extensive soft tissue swelling and enhancement most prominent along the radial aspect of the hand and wrist. There is a subcutaneous fluid collection with surrounding soft tissue enhancement measuring 2.0 x 0.7 cm in the axial dimension and 5.5 cm in length extending along the dorsolateral aspect of the first metacarpal to the proximal phalangeal base of the thumb (post-contrast axial image 12, coronal image 14). Focal area of soft tissue non enhancement lateral to the MCP joint measuring 1.9 x 0.5 x 1.5 cm (series 12, image 17, series 11, image 12). There is a blister-like skin lesion along the dorsal ulnar wrist measuring 0.9 cm (series 12, image 4). IMPRESSION: Extensive soft tissue cellulitis of the hand and wrist, most prominent along the radial aspect. Underlying subcutaneous soft  tissue abscess measuring 2.0 x 0.7 cm in the axial dimension and 5.5 cm in length, which extends along the dorsolateral aspect of the first metacarpal to the proximal phalangeal base of the thumb. Adjacent focal area of soft tissue non enhancement lateral to the MCP joint measuring 1.9 x 0.5 x 1.5 cm which could reflect devitalized soft tissue. No evidence of osteomyelitis or infectious tenosynovitis. Reactive intramuscular edema or myositis along the radial hand without intramuscular fluid collection or deep space abscess in the hand. Electronically Signed   By: Maurine Simmering M.D.   On: 05/09/2022 08:07   CT HAND RIGHT WO CONTRAST  Result Date: 05/07/2022 CLINICAL DATA:  Pain and swelling. Concern for abscess. Possible injury at work 2 days ago. EXAM: CT OF THE RIGHT HAND WITHOUT CONTRAST CT OF THE RIGHT FOREARM WITHOUT CONTRAST TECHNIQUE: Multidetector CT imaging of the right hand was performed according to the standard protocol. Multiplanar CT image reconstructions were also generated. RADIATION DOSE REDUCTION: This exam was performed according to the departmental dose-optimization program which includes automated exposure control, adjustment of the mA and/or kV according to patient size and/or use of iterative reconstruction technique. COMPARISON:  None Available. FINDINGS: Bones/Joint/Cartilage No fracture. No bone lesion. No bone resorption to suggest osteomyelitis. Elbow and wrist joints are normally spaced and aligned. No joint effusion. Ligaments Suboptimally assessed by CT. Muscles and Tendons Muscles unremarkable.  Tendons intact. Soft tissues Diffuse increased attenuation in the subcutaneous and deeper fat planes consistent with edema, possible cellulitis. No fluid collection to suggest an abscess. No soft tissue air. No radiopaque foreign body. IMPRESSION: 1. Diffuse forearm to wrist edema and/or cellulitis, but no evidence of an abscess. No soft tissue air to suggest fasciitis. 2. No fracture or bone  lesion. No evidence of osteomyelitis. No joint effusion to suggest septic arthropathy. Electronically Signed   By: Lajean Manes M.D.   On: 05/07/2022 19:48   CT FOREARM RIGHT WO CONTRAST  Result Date: 05/07/2022 CLINICAL DATA:  Pain and swelling. Concern for abscess. Possible injury at work 2 days ago. EXAM: CT OF THE RIGHT HAND WITHOUT CONTRAST CT OF THE RIGHT FOREARM WITHOUT CONTRAST TECHNIQUE: Multidetector CT imaging of the right hand was performed according to the standard protocol. Multiplanar CT image reconstructions were also generated. RADIATION DOSE REDUCTION: This exam was performed according to the departmental dose-optimization program which includes automated exposure control, adjustment of the mA and/or kV according to patient size and/or use of iterative reconstruction technique. COMPARISON:  None Available. FINDINGS: Bones/Joint/Cartilage No fracture. No bone lesion. No bone resorption to suggest osteomyelitis. Elbow and wrist joints are normally spaced and aligned. No joint effusion. Ligaments Suboptimally assessed  by CT. Muscles and Tendons Muscles unremarkable.  Tendons intact. Soft tissues Diffuse increased attenuation in the subcutaneous and deeper fat planes consistent with edema, possible cellulitis. No fluid collection to suggest an abscess. No soft tissue air. No radiopaque foreign body. IMPRESSION: 1. Diffuse forearm to wrist edema and/or cellulitis, but no evidence of an abscess. No soft tissue air to suggest fasciitis. 2. No fracture or bone lesion. No evidence of osteomyelitis. No joint effusion to suggest septic arthropathy. Electronically Signed   By: Lajean Manes M.D.   On: 05/07/2022 19:48   DG Hand 2 View Right  Result Date: 05/07/2022 CLINICAL DATA:  Concern for foreign body at first MCP joint. EXAM: RIGHT HAND - 2 VIEW COMPARISON:  Right hand radiograph Aug 19 2017 FINDINGS: There is no evidence of fracture or dislocation. There is no evidence of arthropathy or other  focal bone abnormality. There is a 2 mm superficial faint radiodensity within the soft tissues overlying the first MCP joint. There is also significant surrounding soft tissue edema. IMPRESSION: 1. No acute fracture or dislocation. 2. There is a 2 mm superficial faint radiodensity within the soft tissues overlying the first MCP joint, with significant surrounding edema, concerning for a foreign body. Electronically Signed   By: Beryle Flock M.D.   On: 05/07/2022 17:44    Microbiology: Results for orders placed or performed during the hospital encounter of 05/09/22  Culture, fungus without smear     Status: None (Preliminary result)   Collection Time: 05/09/22  9:43 PM   Specimen: Hand, Right; Abscess  Result Value Ref Range Status   Specimen Description ABSCESS  Final   Special Requests HAND,RIGHT  Final   Culture   Final    NO FUNGUS ISOLATED AFTER 5 DAYS Performed at [facility], [address], [place], [place] [address]    Report Status PENDING  Incomplete  Aerobic/Anaerobic Culture w Gram Stain (surgical/deep wound)     Status: None   Collection Time: 05/09/22  9:43 PM   Specimen: Hand, Right; Abscess  Result Value Ref Range Status   Specimen Description ABSCESS  Final   Special Requests RH  Final   Gram Stain   Final    RARE WBC PRESENT, PREDOMINANTLY PMN NO ORGANISMS SEEN    Culture   Final    RARE GROUP A STREP (S.PYOGENES) ISOLATED Beta hemolytic streptococci are predictably susceptible to penicillin and other beta lactams. Susceptibility testing not routinely performed. NO ANAEROBES ISOLATED Performed at [facility], [place] [address] [place], [place] [address]    Report Status 05/15/2022 FINAL  Final  Acid Fast Smear (AFB)     Status: None   Collection Time: 05/09/22  9:43 PM   Specimen: Hand, Right; Abscess  Result Value Ref Range Status   AFB Specimen Processing Concentration  Final   Acid Fast Smear Negative  Final    Comment: (NOTE) Performed At:  [facility] [place], [place] [id number] Rush Farmer MD [id number]    Source (AFB) RH  Final    Comment: Performed at [facility], [place] [address] [place], [place] [address]  Body fluid culture w Gram Stain     Status: None   Collection Time: 05/12/22  9:04 AM   Specimen: Synovium; Body Fluid  Result Value Ref Range Status   Specimen Description SYNOVIAL  Final   Special Requests NONE  Final   Gram Stain   Final    MODERATE WBC  PRESENT, PREDOMINANTLY PMN NO ORGANISMS SEEN    Culture   Final    NO GROWTH 3 DAYS Performed at [place] [address] [place], [place] [address]    Report Status 05/15/2022 FINAL  Final    Labs: CBC: Recent Labs  Lab 05/09/22 1832 05/11/22 0545 05/12/22 0613 05/13/22 0354 05/15/22 0921  WBC 13.7* 11.8* 9.8 14.3* 11.4*  NEUTROABS  --   --  6.3 11.5* 8.5*  HGB 11.4* 10.6* 12.3* 12.3* 11.5*  HCT 34.3* 32.2* 37.0* 37.2* 34.7*  MCV 83.5 85.4 85.5 83.6 85.3  PLT 177 235 295 328 [id number]*   Basic Metabolic Panel: Recent Labs  Lab 05/09/22 0326 05/09/22 1832 05/11/22 0545 05/12/22 0613 05/13/22 0354  NA  --  137 135 135 131*  K  --  4.2 4.4 4.0 4.7  CL  --  110 108 100 98  CO2  --  18* 18* 20* 21*  GLUCOSE  --  85 92 105* 122*  BUN  --  16 15 13 14  $ CREATININE 2.50* 2.16* 2.17* 2.07* 2.13*  CALCIUM  --  8.2* 8.1* 8.9 8.9   Liver Function Tests: No results for input(s): "AST", "ALT", "ALKPHOS", "BILITOT", "PROT", "ALBUMIN" in the last 168 hours. CBG: No results for input(s): "GLUCAP" in the last 168 hours.  Discharge time spent: greater than 30 minutes.  This record has been created using Systems analyst. Errors have been sought and corrected,but may not always be located. Such creation errors do not reflect on the standard of care.   Signed: Lorella Nimrod, MD [facility] 05/15/2022

## 2022-05-31 LAB — CULTURE, FUNGUS WITHOUT SMEAR

## 2022-06-23 LAB — ACID FAST CULTURE WITH REFLEXED SENSITIVITIES (MYCOBACTERIA): Acid Fast Culture: NEGATIVE

## 2022-12-15 ENCOUNTER — Emergency Department: Payer: Commercial Managed Care - PPO

## 2022-12-15 ENCOUNTER — Emergency Department
Admission: EM | Admit: 2022-12-15 | Discharge: 2022-12-15 | Disposition: A | Discharge status: Home / Self Care | Payer: Commercial Managed Care - PPO | Attending: Emergency Medicine | Admitting: Emergency Medicine

## 2022-12-15 ENCOUNTER — Other Ambulatory Visit: Payer: Self-pay

## 2022-12-15 DIAGNOSIS — R0789 Other chest pain: Secondary | ICD-10-CM | POA: Insufficient documentation

## 2022-12-15 DIAGNOSIS — R1032 Left lower quadrant pain: Secondary | ICD-10-CM | POA: Insufficient documentation

## 2022-12-15 DIAGNOSIS — R42 Dizziness and giddiness: Secondary | ICD-10-CM | POA: Insufficient documentation

## 2022-12-15 DIAGNOSIS — R079 Chest pain, unspecified: Secondary | ICD-10-CM

## 2022-12-15 DIAGNOSIS — R1030 Lower abdominal pain, unspecified: Secondary | ICD-10-CM

## 2022-12-15 DIAGNOSIS — R110 Nausea: Secondary | ICD-10-CM | POA: Insufficient documentation

## 2022-12-15 DIAGNOSIS — R1013 Epigastric pain: Secondary | ICD-10-CM | POA: Diagnosis not present

## 2022-12-15 LAB — TROPONIN I (HIGH SENSITIVITY)
Troponin I (High Sensitivity): 17 ng/L (ref <18)
Troponin I (High Sensitivity): 13 ng/L (ref <18)

## 2022-12-15 LAB — CK: Total CK: 389 U/L (ref 49–397)

## 2022-12-15 LAB — BASIC METABOLIC PANEL
Anion gap: 11 (ref 5–15)
BUN: 17 mg/dL (ref 6–20)
CO2: 24 mmol/L (ref 22–32)
Calcium: 9.2 mg/dL (ref 8.9–10.3)
Chloride: 101 mmol/L (ref 98–111)
Creatinine, Ser: 2.35 mg/dL — ABNORMAL HIGH (ref 0.61–1.24)
GFR, Estimated: 32 mL/min — ABNORMAL LOW (ref >60)
Glucose, Bld: 93 mg/dL (ref 70–99)
Potassium: 3.6 mmol/L (ref 3.5–5.1)
Sodium: 136 mmol/L (ref 135–145)

## 2022-12-15 LAB — CBC
HCT: 36.2 % — ABNORMAL LOW (ref 39.0–52.0)
Hemoglobin: 11.8 g/dL — ABNORMAL LOW (ref 13.0–17.0)
MCH: 27.4 pg (ref 26.0–34.0)
MCHC: 32.6 g/dL (ref 30.0–36.0)
MCV: 84 fL (ref 80.0–100.0)
Platelets: 218 10*3/uL (ref 150–400)
RBC: 4.31 MIL/uL (ref 4.22–5.81)
RDW: 13.1 % (ref 11.5–15.5)
WBC: 3.8 10*3/uL — ABNORMAL LOW (ref 4.0–10.5)
nRBC: 0 % (ref 0.0–0.2)

## 2022-12-15 LAB — LIPASE, BLOOD: Lipase: 44 U/L (ref 11–51)

## 2022-12-15 MED ORDER — FAMOTIDINE IN NACL 20-0.9 MG/50ML-% IV SOLN
20.0000 mg | Freq: Once | INTRAVENOUS | Status: AC
  Administered 2022-12-15: 20 mg via INTRAVENOUS
  Filled 2022-12-15: qty 50

## 2022-12-15 NOTE — ED Provider Notes (Signed)
[facility] Provider Note   Event Date/Time   First MD Initiated Contact with Patient 12/15/22 1127     (approximate) History  Abdominal Pain and Chest Pain  HPI John Gregory is a 57 y.o. male with no stated past medical history diverticulitis who presents complaining of left lower quadrant abdominal pain with associated left sided chest pain that radiates to the arm.  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  Patient states that he felt like he was going to pass out.  Patient Dors is this episode lasting approximately 30 seconds and resolving spontaneously however he continued to feel nauseous, lightheaded, and with lower abdominal pain. ROS: Patient currently denies any vision changes, tinnitus, difficulty speaking, facial droop, sore throat, shortness of breath, vomiting/diarrhea, dysuria, or weakness/numbness/paresthesias in any extremity   Physical Exam  Triage Vital Signs: ED Triage Vitals  Encounter Vitals Group     BP 12/15/22 1024 (!) 159/107     Systolic BP Percentile --      Diastolic BP Percentile --      Pulse Rate 12/15/22 1024 (!) 57     Resp 12/15/22 1024 17     Temp 12/15/22 1022 98.3 F (36.8 C)     Temp Source 12/15/22 1022 Oral     SpO2 12/15/22 1024 100 %     Weight 12/15/22 1022 145 lb 1 oz (65.8 kg)     Height 12/15/22 1022 5\' 10"  (1.778 m)     Head Circumference --      Peak Flow --      Pain Score 12/15/22 1022 9     Pain Loc --      Pain Education --      Exclude from Growth Chart --    Most recent vital signs: Vitals:   12/15/22 1345 12/15/22 1400  BP:  (!) 163/94  Pulse: (!) 48 98  Resp: 15 14  Temp:    SpO2: 100% 100%   General: Awake, oriented x4. CV:  Good peripheral perfusion.  Resp:  Normal effort.  Abd:  No distention.  Mild left lower quadrant tenderness to  palpation Other:  Middle-aged well-developed, well-nourished African-American male resting comfortably in no acute distress ED Results / Procedures / Treatments  Labs (all labs ordered are listed, but only abnormal results are displayed) Labs Reviewed  BASIC METABOLIC PANEL - Abnormal; Notable for the following components:      Result Value   Creatinine, Ser 2.35 (*)    GFR, Estimated 32 (*)    All other components within normal limits  CBC - Abnormal; Notable for the following components:   WBC 3.8 (*)    Hemoglobin 11.8 (*)    HCT 36.2 (*)    All other components within normal limits  LIPASE, BLOOD  CK  TROPONIN I (HIGH SENSITIVITY)  TROPONIN I (HIGH SENSITIVITY)   EKG ED ECG REPORT I, Merwyn Katos, the attending physician, personally viewed and interpreted this ECG. Date: 12/15/2022 EKG Time: 1019 Rate: 57 Rhythm: normal sinus rhythm QRS Axis: normal Intervals: normal ST/T Wave abnormalities: normal Narrative Interpretation: no evidence of acute ischemia RADIOLOGY ED MD interpretation: 2 view chest x-ray interpreted by me shows no evidence of acute abnormalities including no pneumonia, pneumothorax, or widened mediastinum -Agree with radiology assessment Official radiology report(s): CT ABDOMEN PELVIS WO CONTRAST  Result Date: 12/15/2022 CLINICAL DATA:  LLQ abdominal pain EXAM: CT ABDOMEN AND PELVIS WITHOUT CONTRAST TECHNIQUE: Multidetector CT imaging of the abdomen and pelvis was performed following the standard protocol without IV contrast. RADIATION DOSE REDUCTION: This exam was performed according to the departmental dose-optimization program which includes automated exposure control, adjustment of the mA and/or kV according to patient size and/or use of iterative reconstruction technique. COMPARISON:  CT scan abdomen and pelvis from 02/11/2020. FINDINGS: Lower chest: The lung bases are clear. No pleural effusion. The heart is normal in size. No pericardial effusion.  Hepatobiliary: The liver is normal in size. Non-cirrhotic configuration. No suspicious mass. There are multiple simple cysts ranging in size from few mm up to 2.9 cm throughout the liver. No intrahepatic or extrahepatic bile duct dilation. Probable contracted gallbladder seen along the inferomedial aspect of the liver, not well visualized on this unenhanced exam. Pancreas: Unremarkable. No pancreatic ductal dilatation or surrounding inflammatory changes. Spleen: Within normal limits. No focal lesion. Adrenals/Urinary Tract: Bilateral adrenal glands are not well evaluated. Enlarged bilateral kidneys exhibiting multiple cysts of various size. There also several hypoattenuating structures, which are incompletely characterized but may represent proteinaceous/hemorrhagic cysts. Evaluation of collecting system is markedly limited however, no frank hydronephrosis noted. There is a 5 mm calcification in the left kidney interpolar region, nonspecific which may represent parenchymal calcification versus nonobstructing calculus. Stomach/Bowel: Markedly limited examination of bowel loops due to lack of oral/intravenous contrast and paucity of visceral fat. No disproportionate dilation of the small or large bowel loops. No evidence of abnormal bowel wall thickening or inflammatory changes. The appendix was not visualized; however there is no acute inflammatory process in the right lower quadrant. Vascular/Lymphatic: No ascites or pneumoperitoneum. No abdominal or pelvic lymphadenopathy, by size criteria. No aneurysmal dilation of the major abdominal arteries. Reproductive: Enlarged prostate. Symmetric seminal vesicles. Other: The visualized soft tissues and abdominal wall are unremarkable. Musculoskeletal: No suspicious osseous lesions. There are mild multilevel degenerative changes in the visualized spine. IMPRESSION: 1. No acute inflammatory process identified within the abdomen or pelvis. 2. 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 3. There are also several hypoattenuating structures, which are incompletely characterized but may represent proteinaceous/hemorrhagic cysts. Evaluation of collecting system is markedly limited however, no frank hydronephrosis noted. 4. Enlarged prostate. 5. Multiple other nonacute observations, as described above. Electronically Signed   By: Jules Schick M.D.   On: 12/15/2022 14:52   DG Chest 2 View  Result Date: 12/15/2022 CLINICAL DATA:  Chest pain EXAM: CHEST - 2 VIEW COMPARISON:  X-ray 05/24/2021 FINDINGS: Hyperinflation. No consolidation, pneumothorax or effusion. No edema. Normal cardiopericardial silhouette. Curvature of the spine. IMPRESSION: Hyperinflation.  No acute cardiopulmonary disease. Electronically Signed   By: Karen Kays M.D.   On: 12/15/2022 11:42   PROCEDURES: Critical Care performed: No .1-3 Lead EKG Interpretation  Performed by: Merwyn Katos, MD Authorized by: Merwyn Katos, MD     Interpretation: normal     ECG rate:  71   ECG rate assessment: normal     Rhythm: sinus rhythm     Ectopy: none     Conduction: normal    MEDICATIONS ORDERED IN ED: Medications  famotidine (PEPCID) IVPB 20 mg premix (0 mg Intravenous Stopped 12/15/22 1515)   IMPRESSION / MDM / ASSESSMENT AND PLAN / ED COURSE  I reviewed the triage vital signs and the nursing notes.  The patient is on the cardiac monitor to evaluate for evidence of arrhythmia and/or significant heart rate changes. Patient's presentation is most consistent with acute presentation with potential threat to life or bodily function. Patient presents for abdominal pain.  Differential diagnosis includes appendicitis, abdominal aortic aneurysm, surgical biliary disease, pancreatitis, SBO, mesenteric ischemia, serious intra-abdominal bacterial illness, genital  torsion. Doubt atypical ACS. Based on history, physical exam, radiologic/laboratory evaluation, there is no red flag results or symptomatology requiring emergent intervention or need for admission at this time Pt tolerating PO. Disposition: Patient will be discharged with strict return precautions and follow up with primary MD within 12-24 hours for further evaluation. Patient understands that this still may have an early presentation of an emergent medical condition such as appendicitis that will require a recheck.   FINAL CLINICAL IMPRESSION(S) / ED DIAGNOSES   Final diagnoses:  Lower abdominal pain  Epigastric pain  Nausea  Chest pain, unspecified type   Rx / DC Orders   ED Discharge Orders     None      Note:  This document was prepared using Dragon voice recognition software and may include unintentional dictation errors.   Merwyn Katos, MD 12/15/22 [phone]

## 2022-12-15 NOTE — ED Triage Notes (Signed)
Pt here with abd and chest pain. Pt states he was at work and got diaphoretic and felt like he was going to pass out. Pt states cp was left sided and radiates to his arm and neck. Pt also endorses upper abd pain. Pt endorses nausea but denies V or D.

## 2022-12-15 NOTE — ED Notes (Signed)
Patient taken to CT scan.

## 2023-12-29 ENCOUNTER — Emergency Department

## 2023-12-29 ENCOUNTER — Emergency Department: Admission: EM | Admit: 2023-12-29 | Discharge: 2023-12-29 | Disposition: A | Discharge status: Home / Self Care

## 2023-12-29 ENCOUNTER — Other Ambulatory Visit: Payer: Self-pay

## 2023-12-29 DIAGNOSIS — N189 Chronic kidney disease, unspecified: Secondary | ICD-10-CM | POA: Insufficient documentation

## 2023-12-29 DIAGNOSIS — I129 Hypertensive chronic kidney disease with stage 1 through stage 4 chronic kidney disease, or unspecified chronic kidney disease: Secondary | ICD-10-CM | POA: Diagnosis not present

## 2023-12-29 DIAGNOSIS — M79652 Pain in left thigh: Secondary | ICD-10-CM | POA: Insufficient documentation

## 2023-12-29 DIAGNOSIS — W138XXA Fall from, out of or through other building or structure, initial encounter: Secondary | ICD-10-CM | POA: Insufficient documentation

## 2023-12-29 MED ORDER — OXYCODONE HCL 5 MG PO TABS
5.0000 mg | ORAL_TABLET | Freq: Once | ORAL | Status: AC
  Administered 2023-12-29: 5 mg via ORAL
  Filled 2023-12-29: qty 1

## 2023-12-29 MED ORDER — ACETAMINOPHEN 500 MG PO TABS: 1000.0000 mg | ORAL_TABLET | Freq: Four times a day (QID) | ORAL | 2 refills | Status: AC | PRN

## 2023-12-29 MED ORDER — LIDOCAINE 5 % EX PTCH
2.0000 | MEDICATED_PATCH | CUTANEOUS | Status: DC
  Administered 2023-12-29: 2 via TRANSDERMAL
  Filled 2023-12-29: qty 2

## 2023-12-29 MED ORDER — LIDOCAINE 5 % EX PTCH: 1.0000 | MEDICATED_PATCH | CUTANEOUS | 0 refills | Status: AC

## 2023-12-29 MED ORDER — ACETAMINOPHEN 500 MG PO TABS
1000.0000 mg | ORAL_TABLET | Freq: Once | ORAL | Status: AC
  Administered 2023-12-29: 1000 mg via ORAL
  Filled 2023-12-29: qty 2

## 2023-12-29 NOTE — ED Provider Notes (Signed)
 [facility] Provider Note    Event Date/Time   First MD Initiated Contact with Patient 12/29/23 1805     (approximate)   History   Leg Pain  Pt to ed from football game via POV for leg pain in left side. Pt stepped off the bleachers and hyper extended my left leg and I jusy want an Xray. Pt is caox4, in no acute distress in triage.    HPI John Gregory is a 58 y.o. male PMH polycystic kidney disease with CKD, hypertension, hyperlipidemia presents for evaluation of left leg pain -Patient was watching football game, stepped down off the bleachers but felt a strange sensation in his left thigh when he landed because he misjudged the height.  No significant pain to his left anterior lateral upper thigh as well as left lower anterior thigh.  Is not taking any pain medications yet.  Cannot use NSAIDs due to his CKD.     Physical Exam   Triage Vital Signs: ED Triage Vitals  Encounter Vitals Group     BP 12/29/23 1642 (!) 180/108     Girls Systolic BP Percentile --      Girls Diastolic BP Percentile --      Boys Systolic BP Percentile --      Boys Diastolic BP Percentile --      Pulse Rate 12/29/23 1642 72     Resp 12/29/23 1642 16     Temp 12/29/23 1642 98.1 F (36.7 C)     Temp Source 12/29/23 1642 Oral     SpO2 12/29/23 1642 98 %     Weight --      Height 12/29/23 1640 5' 10 (1.778 m)     Head Circumference --      Peak Flow --      Pain Score 12/29/23 1640 10     Pain Loc --      Pain Education --      Exclude from Growth Chart --     Most recent vital signs: Vitals:   12/29/23 1642  BP: (!) 180/108  Pulse: 72  Resp: 16  Temp: 98.1 F (36.7 C)  SpO2: 98%     General: Awake, no distress.  CV:  Good peripheral perfusion. RRR, RP 2+ Resp:  Normal effort. CTAB Abd:  No distention. Nontender to deep palpation throughout LLE:   Full range of motion of joints including hip, knee, ankle.  Does have tenderness to palpation over left  anterior lateral upper thigh and left anterior lower thigh.  No hematomas.  Pedal pulse 2+.   ED Results / Procedures / Treatments   Labs (all labs ordered are listed, but only abnormal results are displayed) Labs Reviewed - No data to display   EKG  N/a   RADIOLOGY Radiology interpreted by myself and radiology report reviewed.  No acute pathology identified.    PROCEDURES:  Critical Care performed: No  Procedures   MEDICATIONS ORDERED IN ED: Medications  lidocaine  (LIDODERM ) 5 % 2 patch (2 patches Transdermal Patch Applied 12/29/23 1850)  acetaminophen  (TYLENOL ) tablet 1,000 mg (1,000 mg Oral Given 12/29/23 1855)  oxyCODONE  (Oxy IR/ROXICODONE ) immediate release tablet 5 mg (5 mg Oral Given 12/29/23 1855)     IMPRESSION / MDM / ASSESSMENT AND PLAN / ED COURSE  I reviewed the triage vital signs and the nursing notes.  DDX/MDM/AP: Differential diagnosis includes, but is not limited to, consider possibility of fracture though doubt, suspect muscle strain or possible tear, unlikely of quadriceps though fortunately function appears intact.  Plan: - X-ray left femur - Pain control - crutches prn  Patient's presentation is most consistent with acute complicated illness / injury requiring diagnostic workup.   ED course below.  X-ray unremarkable.  Overall suspect likely quadricep muscle injury.  Remains ambulatory but with pain.  No evidence of other acute pathology at this time.  Given single dose of narcotic here as well as Tylenol  and lidocaine  patches discharged with same.  Plan for PMD follow-up, also referred to orthopedic surgery for reevaluation as needed if symptoms do not improve in the short-term.  Work note provided.  ED return precautions in place.  Patient agrees with plan.  Clinical Course as of 12/29/23 1928  Sat Dec 29, 2023  1806 Radiology interpreted by myself and radiology report reviewed.  No acute pathology identified. [MM]     Clinical Course User Index [MM] Clarine Ozell LABOR, MD     FINAL CLINICAL IMPRESSION(S) / ED DIAGNOSES   Final diagnoses:  Left thigh pain     Rx / DC Orders   ED Discharge Orders          Ordered    Ambulatory referral to Orthopedic Surgery       Comments: L thigh pain, possible left quadriceps muscle injury   12/29/23 1825    acetaminophen  (TYLENOL ) 500 MG tablet  Every 6 hours PRN        12/29/23 1825    lidocaine  (LIDODERM ) 5 %  Every 24 hours        12/29/23 1825             Note:  This document was prepared using Dragon voice recognition software and may include unintentional dictation errors.   Clarine Ozell LABOR, MD 12/29/23 [phone]

## 2023-12-29 NOTE — ED Notes (Signed)
 Patient states his son is coming to pick him up.

## 2023-12-29 NOTE — Discharge Instructions (Signed)
 Your evaluation in the emergency department is overall reassuring.  No evidence of any bony injuries today, but I do suspect you may have strained or otherwise injured a muscle of your left thigh.  I hope that this will improve on its own in the next few days, but in case it does not I have placed a referral for you to follow-up with an orthopedic doctor--they will contact you to schedule an appointment.  You can use Tylenol  and Lidoderm  patches as needed in the meantime for any ongoing discomfort.  Return to the emergency department with any new or worsening symptoms.

## 2023-12-29 NOTE — ED Triage Notes (Signed)
 Pt to ed from football game via POV for leg pain in left side. Pt stepped off the bleachers and hyper extended my left leg and I jusy want an Xray. Pt is caox4, in no acute distress in triage.
